# Patient Record
Sex: Male | Born: 1935 | Race: Black or African American | Hispanic: No | Marital: Married | Smoking: Never smoker
Health system: Southern US, Community
[De-identification: ages and names within clinical notes are randomized; demographics above are authoritative.]

## PROBLEM LIST (undated history)

## (undated) DIAGNOSIS — L719 Rosacea, unspecified: Secondary | ICD-10-CM

## (undated) DIAGNOSIS — I6529 Occlusion and stenosis of unspecified carotid artery: Secondary | ICD-10-CM

## (undated) DIAGNOSIS — E785 Hyperlipidemia, unspecified: Secondary | ICD-10-CM

## (undated) DIAGNOSIS — I10 Essential (primary) hypertension: Secondary | ICD-10-CM

## (undated) DIAGNOSIS — Z8639 Personal history of other endocrine, nutritional and metabolic disease: Secondary | ICD-10-CM

## (undated) DIAGNOSIS — T50995A Adverse effect of other drugs, medicaments and biological substances, initial encounter: Secondary | ICD-10-CM

## (undated) DIAGNOSIS — E039 Hypothyroidism, unspecified: Secondary | ICD-10-CM

## (undated) DIAGNOSIS — E119 Type 2 diabetes mellitus without complications: Secondary | ICD-10-CM

## (undated) DIAGNOSIS — D649 Anemia, unspecified: Secondary | ICD-10-CM

## (undated) DIAGNOSIS — Z862 Personal history of diseases of the blood and blood-forming organs and certain disorders involving the immune mechanism: Secondary | ICD-10-CM

## (undated) DIAGNOSIS — M129 Arthropathy, unspecified: Secondary | ICD-10-CM

## (undated) DIAGNOSIS — R0989 Other specified symptoms and signs involving the circulatory and respiratory systems: Secondary | ICD-10-CM

## (undated) DIAGNOSIS — E559 Vitamin D deficiency, unspecified: Secondary | ICD-10-CM

## (undated) DIAGNOSIS — R7309 Other abnormal glucose: Secondary | ICD-10-CM

## (undated) DIAGNOSIS — F528 Other sexual dysfunction not due to a substance or known physiological condition: Secondary | ICD-10-CM

## (undated) HISTORY — DX: Hyperlipidemia, unspecified: E78.5

## (undated) HISTORY — DX: Hypothyroidism, unspecified: E03.9

## (undated) HISTORY — DX: Occlusion and stenosis of unspecified carotid artery: I65.29

## (undated) HISTORY — PX: TONSILLECTOMY: SUR1361

## (undated) HISTORY — DX: Adverse effect of other drugs, medicaments and biological substances, initial encounter: T50.995A

## (undated) HISTORY — DX: Essential (primary) hypertension: I10

## (undated) HISTORY — DX: Anemia, unspecified: D64.9

## (undated) HISTORY — DX: Personal history of diseases of the blood and blood-forming organs and certain disorders involving the immune mechanism: Z86.2

## (undated) HISTORY — DX: Arthropathy, unspecified: M12.9

## (undated) HISTORY — DX: Personal history of other endocrine, nutritional and metabolic disease: Z86.39

## (undated) HISTORY — DX: Rosacea, unspecified: L71.9

## (undated) HISTORY — DX: Other abnormal glucose: R73.09

## (undated) HISTORY — DX: Vitamin D deficiency, unspecified: E55.9

## (undated) HISTORY — DX: Other sexual dysfunction not due to a substance or known physiological condition: F52.8

## (undated) HISTORY — DX: Other specified symptoms and signs involving the circulatory and respiratory systems: R09.89

## (undated) HISTORY — PX: HERNIA REPAIR: SHX51

---
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
## 2004-08-21 ENCOUNTER — Ambulatory Visit: Payer: Self-pay | Admitting: Family Medicine

## 2004-12-03 ENCOUNTER — Ambulatory Visit: Payer: Self-pay | Admitting: Family Medicine

## 2004-12-10 ENCOUNTER — Ambulatory Visit: Payer: Self-pay | Admitting: Family Medicine

## 2005-03-10 ENCOUNTER — Ambulatory Visit: Payer: Self-pay | Admitting: Family Medicine

## 2005-12-04 ENCOUNTER — Ambulatory Visit: Payer: Self-pay | Admitting: Family Medicine

## 2005-12-04 LAB — CONVERTED CEMR LAB
ALT: 64 units/L — ABNORMAL HIGH (ref 0–40)
Albumin: 4.2 g/dL (ref 3.5–5.2)
Alkaline Phosphatase: 51 units/L (ref 39–117)
BUN: 16 mg/dL (ref 6–23)
Basophils Absolute: 0 10*3/uL (ref 0.0–0.1)
Basophils Relative: 0.3 % (ref 0.0–1.0)
CO2: 31 meq/L (ref 19–32)
Chol/HDL Ratio, serum: 3.6
Glomerular Filtration Rate, Af Am: 78 mL/min/{1.73_m2}
Hemoglobin: 14 g/dL (ref 13.0–17.0)
Hgb A1c MFr Bld: 6.7 % — ABNORMAL HIGH (ref 4.6–6.0)
Monocytes Relative: 9 % (ref 3.0–11.0)
Neutrophils Relative %: 46.7 % (ref 43.0–77.0)
PSA: 1.09 ng/mL (ref 0.10–4.00)
Platelets: 251 10*3/uL (ref 150–400)
Potassium: 4.1 meq/L (ref 3.5–5.1)
RDW: 12.8 % (ref 11.5–14.6)
Total Bilirubin: 0.8 mg/dL (ref 0.3–1.2)
Total Protein: 7.2 g/dL (ref 6.0–8.3)
WBC: 6.9 10*3/uL (ref 4.5–10.5)

## 2005-12-31 ENCOUNTER — Ambulatory Visit: Payer: Self-pay | Admitting: Family Medicine

## 2006-04-08 ENCOUNTER — Ambulatory Visit: Payer: Self-pay | Admitting: Family Medicine

## 2006-04-15 ENCOUNTER — Ambulatory Visit: Payer: Self-pay | Admitting: Family Medicine

## 2006-05-27 ENCOUNTER — Ambulatory Visit: Payer: Self-pay | Admitting: Family Medicine

## 2006-05-27 LAB — CONVERTED CEMR LAB
Albumin: 4 g/dL (ref 3.5–5.2)
Alkaline Phosphatase: 53 units/L (ref 39–117)
BUN: 14 mg/dL (ref 6–23)
GFR calc Af Amer: 77 mL/min
GFR calc non Af Amer: 64 mL/min
Potassium: 3.8 meq/L (ref 3.5–5.1)
Total CHOL/HDL Ratio: 4.4
Triglycerides: 126 mg/dL (ref 0–149)
VLDL: 25 mg/dL (ref 0–40)

## 2006-06-03 ENCOUNTER — Ambulatory Visit: Payer: Self-pay | Admitting: Family Medicine

## 2006-12-31 ENCOUNTER — Ambulatory Visit: Payer: Self-pay | Admitting: Family Medicine

## 2007-01-06 ENCOUNTER — Ambulatory Visit: Payer: Self-pay | Admitting: Family Medicine

## 2007-01-06 DIAGNOSIS — M129 Arthropathy, unspecified: Secondary | ICD-10-CM | POA: Insufficient documentation

## 2007-01-06 DIAGNOSIS — E785 Hyperlipidemia, unspecified: Secondary | ICD-10-CM

## 2007-01-06 DIAGNOSIS — I10 Essential (primary) hypertension: Secondary | ICD-10-CM

## 2007-01-06 DIAGNOSIS — L719 Rosacea, unspecified: Secondary | ICD-10-CM

## 2007-01-06 DIAGNOSIS — F528 Other sexual dysfunction not due to a substance or known physiological condition: Secondary | ICD-10-CM

## 2007-01-06 HISTORY — DX: Arthropathy, unspecified: M12.9

## 2007-01-06 HISTORY — DX: Other sexual dysfunction not due to a substance or known physiological condition: F52.8

## 2007-01-06 HISTORY — DX: Essential (primary) hypertension: I10

## 2007-01-06 HISTORY — DX: Hyperlipidemia, unspecified: E78.5

## 2007-01-06 HISTORY — DX: Rosacea, unspecified: L71.9

## 2007-01-06 LAB — CONVERTED CEMR LAB
ALT: 40 units/L (ref 0–53)
Albumin: 4 g/dL (ref 3.5–5.2)
Alkaline Phosphatase: 61 units/L (ref 39–117)
BUN: 14 mg/dL (ref 6–23)
Basophils Relative: 0.6 % (ref 0.0–1.0)
CO2: 32 meq/L (ref 19–32)
Calcium: 9.3 mg/dL (ref 8.4–10.5)
GFR calc Af Amer: 96 mL/min
GFR calc non Af Amer: 79 mL/min
HDL: 34.7 mg/dL — ABNORMAL LOW (ref >39.0)
LDL Cholesterol: 78 mg/dL (ref 0–99)
Lymphocytes Relative: 40.8 % (ref 12.0–46.0)
Monocytes Relative: 8.2 % (ref 3.0–11.0)
Neutro Abs: 3 10*3/uL (ref 1.4–7.7)
Platelets: 252 10*3/uL (ref 150–400)
Total Protein: 7 g/dL (ref 6.0–8.3)
Triglycerides: 92 mg/dL (ref 0–149)
VLDL: 18 mg/dL (ref 0–40)

## 2007-04-21 ENCOUNTER — Telehealth: Payer: Self-pay | Admitting: Family Medicine

## 2008-01-10 ENCOUNTER — Ambulatory Visit: Payer: Self-pay | Admitting: Family Medicine

## 2008-01-10 DIAGNOSIS — E039 Hypothyroidism, unspecified: Secondary | ICD-10-CM

## 2008-01-10 DIAGNOSIS — T50995A Adverse effect of other drugs, medicaments and biological substances, initial encounter: Secondary | ICD-10-CM

## 2008-01-10 DIAGNOSIS — E559 Vitamin D deficiency, unspecified: Secondary | ICD-10-CM | POA: Insufficient documentation

## 2008-01-10 DIAGNOSIS — D649 Anemia, unspecified: Secondary | ICD-10-CM

## 2008-01-10 HISTORY — DX: Adverse effect of other drugs, medicaments and biological substances, initial encounter: T50.995A

## 2008-01-10 HISTORY — DX: Anemia, unspecified: D64.9

## 2008-01-10 HISTORY — DX: Vitamin D deficiency, unspecified: E55.9

## 2008-01-10 HISTORY — DX: Hypothyroidism, unspecified: E03.9

## 2008-01-11 ENCOUNTER — Encounter: Payer: Self-pay | Admitting: Family Medicine

## 2008-01-13 LAB — CONVERTED CEMR LAB: Vit D, 1,25-Dihydroxy: 21 — ABNORMAL LOW (ref 30–89)

## 2008-01-17 ENCOUNTER — Telehealth: Payer: Self-pay | Admitting: Family Medicine

## 2008-04-12 ENCOUNTER — Ambulatory Visit: Payer: Self-pay | Admitting: Family Medicine

## 2008-04-16 LAB — CONVERTED CEMR LAB
Cholesterol: 156 mg/dL (ref 0–200)
LDL Cholesterol: 98 mg/dL (ref 0–99)
Triglycerides: 121 mg/dL (ref 0–149)

## 2008-10-09 ENCOUNTER — Telehealth: Payer: Self-pay | Admitting: Family Medicine

## 2008-11-22 ENCOUNTER — Ambulatory Visit: Payer: Self-pay | Admitting: Family Medicine

## 2009-01-10 ENCOUNTER — Ambulatory Visit: Payer: Self-pay | Admitting: Family Medicine

## 2009-01-10 ENCOUNTER — Ambulatory Visit: Payer: Self-pay

## 2009-01-10 DIAGNOSIS — I6529 Occlusion and stenosis of unspecified carotid artery: Secondary | ICD-10-CM

## 2009-01-10 DIAGNOSIS — R0989 Other specified symptoms and signs involving the circulatory and respiratory systems: Secondary | ICD-10-CM | POA: Insufficient documentation

## 2009-01-10 HISTORY — DX: Occlusion and stenosis of unspecified carotid artery: I65.29

## 2009-01-10 HISTORY — DX: Other specified symptoms and signs involving the circulatory and respiratory systems: R09.89

## 2009-05-21 ENCOUNTER — Telehealth: Payer: Self-pay | Admitting: Family Medicine

## 2009-11-14 ENCOUNTER — Ambulatory Visit: Payer: Self-pay | Admitting: Family Medicine

## 2009-11-25 ENCOUNTER — Telehealth: Payer: Self-pay | Admitting: Family Medicine

## 2009-12-02 ENCOUNTER — Ambulatory Visit: Payer: Self-pay | Admitting: Family Medicine

## 2009-12-02 DIAGNOSIS — R7309 Other abnormal glucose: Secondary | ICD-10-CM

## 2009-12-02 DIAGNOSIS — Z862 Personal history of diseases of the blood and blood-forming organs and certain disorders involving the immune mechanism: Secondary | ICD-10-CM

## 2009-12-02 DIAGNOSIS — Z8639 Personal history of other endocrine, nutritional and metabolic disease: Secondary | ICD-10-CM

## 2009-12-02 HISTORY — DX: Other abnormal glucose: R73.09

## 2009-12-02 HISTORY — DX: Personal history of diseases of the blood and blood-forming organs and certain disorders involving the immune mechanism: Z86.39

## 2009-12-02 HISTORY — DX: Personal history of diseases of the blood and blood-forming organs and certain disorders involving the immune mechanism: Z86.2

## 2009-12-02 LAB — CONVERTED CEMR LAB
ALT: 29 units/L (ref 0–53)
AST: 29 units/L (ref 0–37)
Albumin: 3.9 g/dL (ref 3.5–5.2)
Alkaline Phosphatase: 53 units/L (ref 39–117)
Basophils Relative: 0.6 % (ref 0.0–3.0)
CO2: 28 meq/L (ref 19–32)
Chloride: 101 meq/L (ref 96–112)
Eosinophils Relative: 1.5 % (ref 0.0–5.0)
GFR calc non Af Amer: 86.45 mL/min (ref >60)
HCT: 39 % (ref 39.0–52.0)
Hemoglobin: 13.5 g/dL (ref 13.0–17.0)
Lymphocytes Relative: 41.9 % (ref 12.0–46.0)
Lymphs Abs: 3.1 10*3/uL (ref 0.7–4.0)
Monocytes Relative: 9.1 % (ref 3.0–12.0)
Neutro Abs: 3.5 10*3/uL (ref 1.4–7.7)
Phosphorus: 2.7 mg/dL (ref 2.3–4.6)
Potassium: 4 meq/L (ref 3.5–5.1)
RBC: 4.21 M/uL — ABNORMAL LOW (ref 4.22–5.81)
Total Protein: 6.8 g/dL (ref 6.0–8.3)

## 2010-01-10 ENCOUNTER — Telehealth (INDEPENDENT_AMBULATORY_CARE_PROVIDER_SITE_OTHER): Payer: Self-pay | Admitting: *Deleted

## 2010-01-23 ENCOUNTER — Ambulatory Visit: Payer: Self-pay | Admitting: Family Medicine

## 2010-01-23 LAB — CONVERTED CEMR LAB
Glucose, Urine, Semiquant: NEGATIVE
Protein, U semiquant: NEGATIVE
Specific Gravity, Urine: 1.02
WBC Urine, dipstick: NEGATIVE
pH: 5.5

## 2010-02-10 LAB — CONVERTED CEMR LAB
ALT: 37 units/L (ref 0–53)
AST: 32 units/L (ref 0–37)
Albumin: 4.3 g/dL (ref 3.5–5.2)
Basophils Absolute: 0.1 10*3/uL (ref 0.0–0.1)
Chloride: 99 meq/L (ref 96–112)
Eosinophils Relative: 2.2 % (ref 0.0–5.0)
GFR calc non Af Amer: 80.38 mL/min (ref >60)
Glucose, Bld: 123 mg/dL — ABNORMAL HIGH (ref 70–99)
HCT: 40.3 % (ref 39.0–52.0)
Hemoglobin: 13.7 g/dL (ref 13.0–17.0)
Lymphs Abs: 3.3 10*3/uL (ref 0.7–4.0)
Monocytes Relative: 7.1 % (ref 3.0–12.0)
Neutro Abs: 4.3 10*3/uL (ref 1.4–7.7)
Potassium: 4 meq/L (ref 3.5–5.1)
RDW: 13.3 % (ref 11.5–14.6)
Sodium: 136 meq/L (ref 135–145)
TSH: 0.84 microintl units/mL (ref 0.35–5.50)
VLDL: 22.6 mg/dL (ref 0.0–40.0)

## 2010-02-13 ENCOUNTER — Encounter: Payer: Self-pay | Admitting: Family Medicine

## 2010-02-13 ENCOUNTER — Ambulatory Visit: Payer: Self-pay | Admitting: Family Medicine

## 2010-03-23 LAB — CONVERTED CEMR LAB
ALT: 59 units/L — ABNORMAL HIGH (ref 0–53)
AST: 50 units/L — ABNORMAL HIGH (ref 0–37)
Albumin: 4.2 g/dL (ref 3.5–5.2)
Alkaline Phosphatase: 58 units/L (ref 39–117)
BUN: 14 mg/dL (ref 6–23)
Basophils Absolute: 0 10*3/uL (ref 0.0–0.1)
Basophils Relative: 0.5 % (ref 0.0–3.0)
Bilirubin Urine: NEGATIVE
Bilirubin Urine: NEGATIVE
Bilirubin, Direct: 0 mg/dL (ref 0.0–0.3)
Blood in Urine, dipstick: NEGATIVE
Blood in Urine, dipstick: NEGATIVE
Calcium: 9.6 mg/dL (ref 8.4–10.5)
Chloride: 102 meq/L (ref 96–112)
Chloride: 105 meq/L (ref 96–112)
Cholesterol: 131 mg/dL (ref 0–200)
Cholesterol: 151 mg/dL (ref 0–200)
Creatinine, Ser: 1 mg/dL (ref 0.4–1.5)
Creatinine, Ser: 1.1 mg/dL (ref 0.4–1.5)
Eosinophils Absolute: 0.1 10*3/uL (ref 0.0–0.7)
Eosinophils Relative: 1.6 % (ref 0.0–5.0)
GFR calc Af Amer: 95 mL/min
GFR calc non Af Amer: 79 mL/min
GFR calc non Af Amer: 84.86 mL/min (ref >60)
Glucose, Urine, Semiquant: NEGATIVE
HCT: 40.9 % (ref 39.0–52.0)
HDL: 36 mg/dL — ABNORMAL LOW (ref >39.00)
HDL: 38.3 mg/dL — ABNORMAL LOW (ref >39.0)
Ketones, urine, test strip: NEGATIVE
Ketones, urine, test strip: NEGATIVE
LDL Cholesterol: 73 mg/dL (ref 0–99)
Lymphocytes Relative: 40.9 % (ref 12.0–46.0)
MCHC: 33.8 g/dL (ref 30.0–36.0)
MCV: 92.7 fL (ref 78.0–100.0)
MCV: 95.6 fL (ref 78.0–100.0)
Monocytes Absolute: 0.6 10*3/uL (ref 0.1–1.0)
Monocytes Absolute: 0.6 10*3/uL (ref 0.1–1.0)
Neutrophils Relative %: 48.8 % (ref 43.0–77.0)
Neutrophils Relative %: 57.4 % (ref 43.0–77.0)
Nitrite: NEGATIVE
PSA: 1.01 ng/mL (ref 0.10–4.00)
PSA: 1.11 ng/mL (ref 0.10–4.00)
Platelets: 221 10*3/uL (ref 150–400)
Platelets: 225 10*3/uL (ref 150.0–400.0)
Potassium: 3.9 meq/L (ref 3.5–5.1)
Protein, U semiquant: NEGATIVE
Protein, U semiquant: NEGATIVE
RBC: 4.35 M/uL (ref 4.22–5.81)
Total Bilirubin: 0.9 mg/dL (ref 0.3–1.2)
Total Bilirubin: 1 mg/dL (ref 0.3–1.2)
Total CHOL/HDL Ratio: 4
Total Protein: 7.7 g/dL (ref 6.0–8.3)
Triglycerides: 110 mg/dL (ref 0.0–149.0)
Triglycerides: 112 mg/dL (ref 0–149)
Urobilinogen, UA: 0.2
VLDL: 22 mg/dL (ref 0.0–40.0)
VLDL: 22 mg/dL (ref 0–40)
Vit D, 25-Hydroxy: 42 ng/mL (ref 30–89)
WBC: 7.5 10*3/uL (ref 4.5–10.5)
pH: 7

## 2010-03-27 NOTE — Assessment & Plan Note (Signed)
Summary: FLU SHOT // RS   Nurse Visit   Allergies: No Known Drug Allergies  Orders Added: 1)  Admin 1st Vaccine [90471] 2)  Flu Vaccine 3yrs + [90658] Flu Vaccine Consent Questions     Do you have a history of severe allergic reactions to this vaccine? no    Any prior history of allergic reactions to egg and/or gelatin? no    Do you have a sensitivity to the preservative Thimersol? no    Do you have a past history of Guillan-Barre Syndrome? no    Do you currently have an acute febrile illness? no    Have you ever had a severe reaction to latex? no    Vaccine information given and explained to patient? yes    Are you currently pregnant? no    Lot Number:[id number]   Exp Date:08/23/2010   Site Given  Left Deltoid IM .lbflu 

## 2010-03-27 NOTE — Progress Notes (Signed)
Summary: refill  Phone Note Call from Patient Call back at Home Phone [phone] Call back at [phone]   Caller: Patient--live call Summary of Call: refill doxycycline mono 100mg . [facility]     [place]---[phone] Initial call taken by: Warnell Forester,  November 25, 2009 8:59 AM  Follow-up for Phone Call        Need to know for what and when he last took it. I only see Erythromycin and Cephalexin in his med list. No Doxy seen  Additional Follow-up for Phone Call Additional follow up Details #1::        Patient states it was for Urinary infection. It was prescribed in 2009. Additional Follow-up by: Josph Macho RMA,  November 25, 2009 10:11 AM    Additional Follow-up for Phone Call Additional follow up Details #2::    He can have Doxycycline 100mg  by mouth two times a day x 7 days but if symptoms are not improving he should come in for evaluaiton  Follow-up by: Danise Edge MD,  November 25, 2009 4:28 PM  New/Updated Medications: DOXYCYCLINE HYCLATE 100 MG TABS (DOXYCYCLINE HYCLATE) by mouth two times a day X7 days Prescriptions: DOXYCYCLINE HYCLATE 100 MG TABS (DOXYCYCLINE HYCLATE) by mouth two times a day X7 days  #14 x 0   Entered by:   Josph Macho RMA   Authorized by:   Danise Edge MD   Signed by:   Josph Macho RMA on 11/25/2009   Method used:   [facility]  [address] [phone][facility] ([facility]ail[facility]       [address]       [place], [place]  [address]       Ph: [phone] or [phone]       Fax: [phone]   RxID:   [id number]   Appended Document: refill Left a vm on pts cell stating meds were being sent in and if they don't improve to please come in for an office visit.

## 2010-03-27 NOTE — Assessment & Plan Note (Signed)
Summary: fu on med/njr   Vital Signs:  Patient profile:   75 year old male Height:      68.5 inches (173.99 cm) Weight:      188 pounds (85.45 kg) BMI:     28.27 O2 Sat:      99 % on Room air Temp:     97.9 degrees F (36.61 degrees C) oral Pulse rate:   75 / minute BP sitting:   134 / 92  (left arm) Cuff size:   regular  Vitals Entered By: Josph Macho RMA (December 02, 2009 9:40 AM)  O2 Flow:  Room air CC: Follow-up visit/ CF Is Patient Diabetic? No   History of Present Illness: Patient in today for evaluation of dysuria. He had called a couple weeks ago with c/o dysuria and requested a course of Doxycycline for some urinary symptoms. He felt that the dysuria was better after each dose only to worsen a few hours later. No urinary frequency/urgency/abd or back pain. No f/c/n/v/malaise or myalgias.   Current Medications (verified): 1)  Ibuprofen 800 Mg Tabs (Ibuprofen) .... Three Times A Day With Food 2)  Lipitor 40 Mg  Tabs (Atorvastatin Calcium) .... Once Daily 3)  Quinapril-Hydrochlorothiazide 20-12.5 Mg  Tabs (Quinapril-Hydrochlorothiazide) .... Two Times A Day 4)  Hydrocodone-Homatropine 5-1.5 Mg/94ml Syrp (Hydrocodone-Homatropine) .Marland Kitchen.. 1-2 Tsp Q4h As Needed Cough 5)  Vitamin D 2000 Unit Tabs (Cholecalciferol) .Marland Kitchen.. 1 Once Daily 6)  Aspir-Low 81 Mg Tbec (Aspirin) .Marland Kitchen.. 1 Qd 7)  Potassium Otc .Marland Kitchen.. 1 Qd 8)  Viagra 100 Mg Tabs (Sildenafil Citrate) .Marland Kitchen.. 1 As Instructed 9)  Erythromycin Topical Gel 2% .... Apply Once or Twice A Day To Affected Area  Allergies (verified): No Known Drug Allergies  Past History:  Past medical history reviewed for relevance to current acute and chronic problems. Social history (including risk factors) reviewed for relevance to current acute and chronic problems.  Social History: Reviewed history and no changes required.  Review of Systems      See HPI  Physical Exam  General:  Well-developed,well-nourished,in no acute distress;  alert,appropriate and cooperative throughout examination Mouth:  Oral mucosa and oropharynx without lesions or exudates.  Teeth in good repair. Neck:  No deformities, masses, or tenderness noted. Lungs:  Normal respiratory effort, chest expands symmetrically. Lungs are clear to auscultation, no crackles or wheezes. Heart:  Normal rate and regular rhythm. S1 and S2 normal without gallop, click, rub or other extra sounds.grade  1/6 systolic murmur.   Abdomen:  Bowel sounds positive,abdomen soft and non-tender without masses, organomegaly or hernias noted. Extremities:  No clubbing, cyanosis, edema, or deformity noted with normal full range of motion of all joints.   Psych:  Cognition and judgment appear intact. Alert and cooperative with normal attention span and concentration. No apparent delusions, illusions, hallucinations   Impression & Recommendations:  Problem # 1:  DYSURIA (ICD-788.1)  The following medications were removed from the medication list:    Cephalexin 500 Mg Caps (Cephalexin) .Marland Kitchen... 1 by mouth three times a day    Doxycycline Hyclate 100 Mg Tabs (Doxycycline hyclate) ..... By mouth two times a day x7 days His updated medication list for this problem includes:    Cipro 250 Mg Tabs (Ciprofloxacin hcl) .Marland Kitchen... 1 tab by mouth two times a day x 10 days  Problem # 2:  LIVER FUNCTION TESTS, ABNORMAL, HX OF (ICD-V12.2)  Orders: TLB-Hepatic/Liver Function Pnl (80076-HEPATIC) Repeat lfts to evaluate  Problem # 3:  HYPERGLYCEMIA, BORDERLINE (ICD-790.29) repeat  Renal today. Avoid simple carbs  Complete Medication List: 1)  Ibuprofen 800 Mg Tabs (Ibuprofen) .... Three times a day with food 2)  Lipitor 40 Mg Tabs (Atorvastatin calcium) .... Once daily 3)  Quinapril-hydrochlorothiazide 20-12.5 Mg Tabs (Quinapril-hydrochlorothiazide) .... Two times a day 4)  Hydrocodone-homatropine 5-1.5 Mg/62ml Syrp (Hydrocodone-homatropine) .Marland Kitchen.. 1-2 tsp q4h as needed cough 5)  Vitamin D 2000 Unit  Tabs (Cholecalciferol) .Marland Kitchen.. 1 once daily 6)  Aspir-low 81 Mg Tbec (Aspirin) .Marland Kitchen.. 1 qd 7)  Potassium Otc  .Marland Kitchen.. 1 qd 8)  Viagra 100 Mg Tabs (Sildenafil citrate) .Marland Kitchen.. 1 as instructed 9)  Erythromycin Topical Gel 2%  .... Apply once or twice a day to affected area 10)  Cipro 250 Mg Tabs (Ciprofloxacin hcl) .Marland Kitchen.. 1 tab by mouth two times a day x 10 days  Other Orders: TLB-Renal Function Panel (80069-RENAL) TLB-CBC Platelet - w/Differential (85025-CBCD) UA Dipstick w/o Micro (automated)  (81003)  Patient Instructions: 1)  Drink plenty of fluids up to 3-4 quarts a day. Cranberry juice is especially recommended in addition to large amounts of water. Avoid caffeine & carbonated drinks, they tend to irritate the bladder, Return in 3-5 days if you're not better: sooner if you're feeling worse.  2)  Take your antibiotic as prescribed until ALL of it is gone, but stop if you develop a rash or swelling and contact our office as soon as possible.  3)  Please schedule a follow-up appointment as needed if symptoms worsenor do ot improve 4)  Consider a probiotic such as a yogurt daily or Align 1 cap daily for the month whenever you are placed on an antibiotics Prescriptions: CIPRO 250 MG TABS (CIPROFLOXACIN HCL) 1 tab by mouth two times a day x 10 days  #20 x 0   Entered and Authorized by:   Danise Edge MD   Signed by:   Danise Edge MD on 12/02/2009   Method used:   [facility]        [facility]  [address] [phone][facility] (retail)       [address]       [place], [place]  [address]       Ph: [phone] or [phone]       Fax: [phone]   RxID:   [id number]   Appended Document: Orders Update    Clinical Lists Changes  Orders: Added new Service order of Venipuncture (09323) - Signed Added new Service order of Specimen Handling (55732) - Signed Observations: Added new observation of COMMENTS: Wynona Canes, CMA  December 02, 2009 11:32 AM  (12/02/2009 10:33) Added  new observation of PH URINE: 5.5  (12/02/2009 10:33) Added new observation of SPEC GR URIN: 1.010  (12/02/2009 10:33) Added new observation of APPEARANCE U: Clear  (12/02/2009 10:33) Added new observation of UA COLOR: yellow  (12/02/2009 10:33) Added new observation of WBC DIPSTK U: negative  (12/02/2009 10:33) Added new observation of NITRITE URN: negative  (12/02/2009 10:33) Added new observation of UROBILINOGEN: 0.2  (12/02/2009 10:33) Added new observation of PROTEIN, URN: negative  (12/02/2009 10:33) Added new observation of BLOOD UR DIP: negative  (12/02/2009 10:33) Added new observation of KETONES URN: negative  (12/02/2009 10:33) Added new observation of BILIRUBIN UR: negative  (12/02/2009 10:33) Added new observation of GLUCOSE, URN: negative  (12/02/2009 10:33)      Laboratory Results   Urine Tests  Date/Time Recieved: December 02, 2009 11:32 AM  Date/Time Reported: December 02, 2009 11:32  AM   Routine Urinalysis   Color: yellow Appearance: Clear Glucose: negative   (Normal Range: Negative) Bilirubin: negative   (Normal Range: Negative) Ketone: negative   (Normal Range: Negative) Spec. Gravity: 1.010   (Normal Range: 1.003-1.035) Blood: negative   (Normal Range: Negative) pH: 5.5   (Normal Range: 5.0-8.0) Protein: negative   (Normal Range: Negative) Urobilinogen: 0.2   (Normal Range: 0-1) Nitrite: negative   (Normal Range: Negative) Leukocyte Esterace: negative   (Normal Range: Negative)    Comments: Wynona Canes, CMA  December 02, 2009 11:32 AM    notify ua neg, would only take 3 days of the antibiotic I gave him, have him increase his hydration, dehydration can lead to some burning   Pt informed/ CF

## 2010-03-27 NOTE — Assessment & Plan Note (Signed)
Summary: cpx//ccm----PT RSC (BMP) // RS----PT [facility] // RS   Vital Signs:  Patient profile:   75 year old male Height:      67.25 inches Weight:      189 pounds Temp:     98.6 degrees F oral Pulse rate:   84 / minute Pulse rhythm:   regular BP sitting:   116 / 88  (left arm) Cuff size:   large  Vitals Entered By: Alfred Levins, CMA (February 13, 2010 2:10 PM) CC: cpx   History of Present Illness: This 75 year old African American male who is very pleasant and cooperative patient for many years He is in to discuss his medical problem as well as renew his medication also were concerned somewhat about ahyperglycemia LH he's had some pain in the lumbosacral back when he doesn't take Profen and instructed to restart  Ibuprofen 800 mg t.i.d. for back pain as well as elbow pain Blood pressure well controlled at 116/88 Hyperlipidemia controlled with Lipitor 40 mg each today Continues to have erectile dysfunction and normally with Viagra Rosacea controlled with erythromycin topical gel An insurance exam blood sugar was 127 and hemoglobin A 1 C had to be repeated today    Current Medications (verified): 1)  Ibuprofen 800 Mg Tabs (Ibuprofen) .... Three Times A Day With Food 2)  Lipitor 40 Mg  Tabs (Atorvastatin Calcium) .... Once Daily 3)  Quinapril-Hydrochlorothiazide 20-12.5 Mg  Tabs (Quinapril-Hydrochlorothiazide) .... Two Times A Day 4)  Vitamin D 2000 Unit Tabs (Cholecalciferol) .Marland Kitchen.. 1 Once Daily 5)  Aspir-Low 81 Mg Tbec (Aspirin) .Marland Kitchen.. 1 Qd 6)  Potassium Otc .Marland Kitchen.. 1 Qd 7)  Viagra 100 Mg Tabs (Sildenafil Citrate) .Marland Kitchen.. 1 As Instructed 8)  Erythromycin Topical Gel 2% .... Apply Once or Twice A Day To Affected Area  Allergies (verified): No Known Drug Allergies  Review of Systems      See HPI General:  Denies chills, fatigue, fever, loss of appetite, malaise, sleep disorder, sweats, weakness, and weight loss. Eyes:  Denies blurring, discharge, double vision, eye irritation, eye pain,  halos, itching, light sensitivity, red eye, vision loss-1 eye, and vision loss-both eyes. ENT:  Denies decreased hearing, difficulty swallowing, ear discharge, earache, hoarseness, nasal congestion, nosebleeds, postnasal drainage, ringing in ears, sinus pressure, and sore throat. CV:  Denies bluish discoloration of lips or nails, chest pain or discomfort, difficulty breathing at night, difficulty breathing while lying down, fainting, fatigue, leg cramps with exertion, lightheadness, near fainting, palpitations, shortness of breath with exertion, swelling of feet, swelling of hands, and weight gain. Resp:  Denies chest discomfort, chest pain with inspiration, cough, coughing up blood, excessive snoring, hypersomnolence, morning headaches, pleuritic, shortness of breath, sputum productive, and wheezing. GI:  Denies abdominal pain, bloody stools, change in bowel habits, constipation, dark tarry stools, diarrhea, excessive appetite, gas, hemorrhoids, indigestion, loss of appetite, nausea, vomiting, vomiting blood, and yellowish skin color. GU:  Complains of erectile dysfunction.  Physical Exam  General:  Well-developed,well-nourished,in no acute distress; alert,appropriate and cooperative throughout examinationoverweight-appearing.   Head:  Normocephalic and atraumatic without obvious abnormalities. No apparent alopecia or balding. Eyes:  No corneal or conjunctival inflammation noted. EOMI. Perrla. Funduscopic exam benign, without hemorrhages, exudates or papilledema. Vision grossly normal. Ears:  External ear exam shows no significant lesions or deformities.  Otoscopic examination reveals clear canals, tympanic membranes are intact bilaterally without bulging, retraction, inflammation or discharge. Hearing is grossly normal bilaterally. Nose:  External nasal examination shows no deformity or inflammation. Nasal mucosa are pink  and moist without lesions or exudates. Mouth:  Oral mucosa and oropharynx  without lesions or exudates.  Teeth in good repair. Neck:  carotid pulse on the left is much stronger than the right however no bruit present Chest Wall:  No deformities, masses, tenderness or gynecomastia noted. Breasts:  No masses or gynecomastia noted Lungs:  Normal respiratory effort, chest expands symmetrically. Lungs are clear to auscultation, no crackles or wheezes. Heart:  Normal rate and regular rhythm. S1 and S2 normal without gallop, murmur, click, rub or other extra sounds. Abdomen:  Bowel sounds positive,abdomen soft and non-tender without masses, organomegaly or hernias noted. Rectal:  No external abnormalities noted. Normal sphincter tone. No rectal masses or tenderness. Genitalia:  Testes bilaterally descended without nodularity, tenderness or masses. No scrotal masses or lesions. No penis lesions or urethral discharge. Prostate:  1+ enlarged.   Msk:  tenderness over both SI joints bowel laterally as well as tenderness of the lateral epicondyles the right elbow Pulses:  R and L carotid,radial,femoral,dorsalis pedis and posterior tibial pulses are full and equal bilaterally Extremities:  No clubbing, cyanosis, edema, or deformity noted with normal full range of motion of all joints.   Neurologic:  No cranial nerve deficits noted. Station and gait are normal. Plantar reflexes are down-going bilaterally. DTRs are symmetrical throughout. Sensory, motor and coordinative functions appear intact. Skin:  minimal signs of rosacea over the face Cervical Nodes:  No lymphadenopathy noted Axillary Nodes:  No palpable lymphadenopathy Inguinal Nodes:  No significant adenopathy Psych:  Cognition and judgment appear intact. Alert and cooperative with normal attention span and concentration. No apparent delusions, illusions, hallucinations   Impression & Recommendations:  Problem # 1:  HYPERGLYCEMIA, BORDERLINE (ICD-790.29) Assessment Deteriorated  Orders: Specimen Handling (04540) TLB-A1C  / Hgb A1C (Glycohemoglobin) (83036-A1C)  Problem # 2:  LIVER FUNCTION TESTS, ABNORMAL, HX OF (ICD-V12.2) Assessment: Improved  Problem # 3:  ERECTILE DYSFUNCTION (ICD-302.72) Assessment: Unchanged  His updated medication list for this problem includes:    Viagra 100 Mg Tabs (Sildenafil citrate) .Marland Kitchen... 1 as instructed  Problem # 4:  ARTHRITIS (ICD-716.90) Assessment: Deteriorated  Problem # 5:  ROSACEA (ICD-695.3) Assessment: Improved erythromycin topical gel 2% once or twice today  Problem # 6:  HYPERLIPIDEMIA (ICD-272.4) Assessment: Improved  His updated medication list for this problem includes:    Lipitor 40 Mg Tabs (Atorvastatin calcium) ..... Once daily  Problem # 7:  HYPERTENSION, BENIGN ESSENTIAL (ICD-401.1) Assessment: Improved  His updated medication list for this problem includes:    Quinapril-hydrochlorothiazide 20-12.5 Mg Tabs (Quinapril-hydrochlorothiazide) .Marland Kitchen..Marland Kitchen Two times a day  Orders: Prescription Created Electronically [id number])  Complete Medication List: 1)  Ibuprofen 800 Mg Tabs (Ibuprofen) .... Three times a day with food 2)  Lipitor 40 Mg Tabs (Atorvastatin calcium) .... Once daily 3)  Quinapril-hydrochlorothiazide 20-12.5 Mg Tabs (Quinapril-hydrochlorothiazide) .... Two times a day 4)  Vitamin D 2000 Unit Tabs (Cholecalciferol) .Marland Kitchen.. 1 once daily 5)  Aspir-low 81 Mg Tbec (Aspirin) .Marland Kitchen.. 1 qd 6)  Potassium Otc  .Marland Kitchen.. 1 qd 7)  Viagra 100 Mg Tabs (Sildenafil citrate) .Marland Kitchen.. 1 as instructed 8)  Erythromycin Topical Gel 2%  .... Apply once or twice a day to affected area  Other Orders: Venipuncture (14782)  Patient Instructions: 1)  glucose running borderline high and elevated Hgb A1C , to  2)  decrease sweets in diet and increase exercise if possible 3)  will recheck HgbA1Ctoday 4)  Physical exam good otherwise Prescriptions: LIPITOR 40 MG  TABS (ATORVASTATIN CALCIUM) once  daily  #30 x 11   Entered and Authorized by:   Judithann Sheen MD   Signed  by:   Judithann Sheen MD on 02/13/2010   Method used:   Print then Give to Patient   RxID:   [id number] QUINAPRIL-HYDROCHLOROTHIAZIDE 20-12.5 MG  TABS (QUINAPRIL-HYDROCHLOROTHIAZIDE) two times a day  #60 x 11   Entered by:   Alfred Levins, CMA   Authorized by:   Judithann Sheen MD   Signed by:   Alfred Levins, CMA on 02/13/2010   Method used:   Print then Give to Patient   RxID:   [id number] LIPITOR 40 MG  TABS (ATORVASTATIN CALCIUM) once daily  #30 x 2   Entered by:   Alfred Levins, CMA   Authorized by:   Judithann Sheen MD   Signed by:   Alfred Levins, CMA on 02/13/2010   Method used:   Print then Give to Patient   RxID:   [id number] IBUPROFEN 800 MG TABS (IBUPROFEN) three times a day with food  #90 x 11   Entered by:   Alfred Levins, CMA   Authorized by:   Judithann Sheen MD   Signed by:   Alfred Levins, CMA on 02/13/2010   Method used:   Print then Give to Patient   RxID:   [id number]    Orders Added: 1)  Venipuncture [62376] 2)  Specimen Handling [99000] 3)  TLB-A1C / Hgb A1C (Glycohemoglobin) [83036-A1C] 4)  Prescription Created Electronically [[id number]] 5)  Est. Patient Level IV [28315]  Appended Document: cpx//ccm----PT RSC (BMP) // RS----PT [facility] // RS EKG revealed no acute abnormalities or changes from previous EKG

## 2010-03-27 NOTE — Progress Notes (Signed)
Summary: wants med for skin called in   Phone Note Call from Patient   Caller: Patient Summary of Call: states from time to time has skin problem and wants erythromycin topical gel 2% called in along with cephalexin 500mg  three [facility] a day to [facility]  Initial call taken by: Pura Spice, RN,  May 21, 2009 1:54 PM  Follow-up for Phone Call        per dr staffordrx called l in erythromycin topical get and cephalexin 500 mg  Follow-up by: Pura Spice, RN,  May 21, 2009 3:54 PM    New/Updated Medications: CEPHALEXIN 500 MG  CAPS (CEPHALEXIN) 1 by mouth three times a day * ERYTHROMYCIN TOPICAL GEL 2% apply once or twice a day to affected area Prescriptions: CEPHALEXIN 500 MG  CAPS (CEPHALEXIN) 1 by mouth three times a day  #90 x 0   Entered by:   Pura Spice, RN   Authorized by:   Judithann Sheen MD   Signed by:   Pura Spice, RN on 05/21/2009   Method used:   Telephoned to ...       [facility]  [address] [phone]* (retail)       [address]       [place], [place]  [address]       Ph: [phone] or [phone]       Fax: [phone]   RxID:   [id number] ERYTHROMYCIN TOPICAL GEL 2% apply once or twice a day to affected area  #45 gram x 1   Entered by:   Pura Spice, RN   Authorized by:   Judithann Sheen MD   Signed by:   Pura Spice, RN on 05/21/2009   Method used:   Telephoned to ...       [facility]  [address] [phone]* (retail)       [address]       [place], [place]  [address]       Ph: [phone] or [phone]       Fax: [phone]   RxID:   [id number]

## 2010-03-27 NOTE — Progress Notes (Signed)
Summary: Pt has question re: lab results  Phone Note Call from Patient Call back at [phone] cell   Caller: Patient Summary of Call: Pt has question on a lab result. Pls call asap today.  Initial call taken by: Lucy Antigua,  January 10, 2010 8:48 AM  Follow-up for Phone Call        Patient states he had labs done for life insurance  Creatinine was 1.4 (range .5- 1.4) GFR 60 (range 60-99.9) Fructosamine 2.11 (range 1.2 -2.1) Hemoglobin 6.70 (range 0-5.99)  pt would like to know if there is anything he should do with these numbers? Follow-up by: Josph Macho RMA,  January 10, 2010 10:39 AM  Additional Follow-up for Phone Call Additional follow up Details #1::        He must have meant hemoglobin A1C I think. Hemoglobin normal is around 12-14. HGBA1C does not diagnose diabetes but says his sugar is running slightly hi and he needs to minimize carbs. Have 1 carb per meal, brown or complex instead of white. Shoudl repeat the hgba1c with Dr Scotty Court in 3 months to assess for stability. The creatinine is borderline and suggests some dehydration recommend increase clear fluids  signs sugar is getting worse are increased urination and thirst as well as fatigue and abdominal pain, call and see Dr Scotty Court if concerns persist Additional Follow-up by: Danise Edge MD,  January 10, 2010 12:11 PM    Additional Follow-up for Phone Call Additional follow up Details #2::    Patient informed. Follow-up by: Josph Macho RMA,  January 10, 2010 1:24 PM

## 2010-04-15 ENCOUNTER — Telehealth: Payer: Self-pay | Admitting: Family Medicine

## 2010-04-15 NOTE — Telephone Encounter (Signed)
Pt needs to speak with nurse in regards to immunizations he needs, in particularly tetanus.

## 2010-04-15 NOTE — Telephone Encounter (Signed)
Pt called and needs to get date of his last tetanus shot. Pls call asap.

## 2010-04-15 NOTE — Telephone Encounter (Signed)
Spoke with wife and per our records--  no tetanus immunization found in chart. Stated [place]. Informed them to [employer] health dept to see what they may need for that particular country.

## 2010-05-07 ENCOUNTER — Other Ambulatory Visit (INDEPENDENT_AMBULATORY_CARE_PROVIDER_SITE_OTHER): Payer: Medicare Other

## 2010-05-07 DIAGNOSIS — R7309 Other abnormal glucose: Secondary | ICD-10-CM

## 2010-05-07 DIAGNOSIS — R739 Hyperglycemia, unspecified: Secondary | ICD-10-CM

## 2010-05-15 ENCOUNTER — Telehealth: Payer: Self-pay | Admitting: *Deleted

## 2010-05-15 NOTE — Telephone Encounter (Signed)
Pt would like lab results and copy of immunizations, please.

## 2010-05-15 NOTE — Telephone Encounter (Signed)
Attempted to call pt no answering machine///will return call

## 2010-05-16 ENCOUNTER — Other Ambulatory Visit: Payer: Self-pay

## 2010-05-16 NOTE — Telephone Encounter (Signed)
Kevin Wise please call pt on [phone]

## 2010-05-16 NOTE — Telephone Encounter (Signed)
Pt is aware A1C is good;

## 2010-05-21 ENCOUNTER — Other Ambulatory Visit: Payer: Self-pay

## 2010-05-23 ENCOUNTER — Encounter: Payer: Self-pay | Admitting: Family Medicine

## 2010-06-06 NOTE — Progress Notes (Signed)
Pt aware.

## 2010-09-12 ENCOUNTER — Other Ambulatory Visit: Payer: Medicare Other

## 2010-09-12 ENCOUNTER — Other Ambulatory Visit (INDEPENDENT_AMBULATORY_CARE_PROVIDER_SITE_OTHER): Payer: Medicare Other

## 2010-09-12 DIAGNOSIS — E119 Type 2 diabetes mellitus without complications: Secondary | ICD-10-CM

## 2010-09-12 LAB — HEMOGLOBIN A1C: Hgb A1c MFr Bld: 7.1 % — ABNORMAL HIGH (ref 4.6–6.5)

## 2010-09-15 ENCOUNTER — Telehealth: Payer: Self-pay

## 2010-09-15 NOTE — Telephone Encounter (Signed)
Called to give pt lab resutls; left a message for pt to return call.  Will attempt to call pt again

## 2010-09-17 ENCOUNTER — Telehealth: Payer: Self-pay

## 2010-09-17 DIAGNOSIS — R739 Hyperglycemia, unspecified: Secondary | ICD-10-CM

## 2010-09-17 NOTE — Telephone Encounter (Signed)
Pt called to get lab results.  Pt is aware and has set up an appt for 3 months to recheck hga1c.

## 2010-11-20 ENCOUNTER — Encounter: Payer: Self-pay | Admitting: Family Medicine

## 2010-11-20 ENCOUNTER — Ambulatory Visit (INDEPENDENT_AMBULATORY_CARE_PROVIDER_SITE_OTHER): Payer: Medicare Other | Admitting: Family Medicine

## 2010-11-20 VITALS — BP 102/72 | Temp 98.2°F | Wt 181.0 lb

## 2010-11-20 DIAGNOSIS — Z23 Encounter for immunization: Secondary | ICD-10-CM

## 2010-11-20 DIAGNOSIS — M6530 Trigger finger, unspecified finger: Secondary | ICD-10-CM

## 2010-11-20 NOTE — Patient Instructions (Signed)
Trigger Finger Digital Tendinitis and StenosingTenosynovitis Trigger finger is a common disorder that causes an often painful catching of the fingers or thumb. It occurs as a clicking, snapping or locking of a finger in the palm of the hand. The reason for this is that there is a problem with the tendons which flex the fingers sliding smoothly through their sheaths. The cause of this may be inflammation of the tendon and sheath, or from a thickening or nodule in the tendon. The condition may occur in any finger or a couple fingers at the same time. The cause may be overuse while doing the same activity over and over again with your hands.  Tendons are the tough cords that connect the muscles to bones. Muscles and tendons are part of the system which allows your body to move. When muscles contract in the forearm on the palm side, they pull the tendons toward the elbow and cause the fingers and thumb to bend (flex) toward the palm. These are the flexor tendons. The tendons slide through a slippery smooth membrane (synovium) which is called the tendon sheath. The sheaths have areas of tough fibrous tissues surrounding them which hold the tendons close to the bone. These are called pulleys because they work like a pulley. The first pulley is in the palm of the hand near the crease which runs across your palm. If the area of the tendon thickening is near the pulley, the tendon cannot slide smoothly through the pulley and this causes the trigger finger. The finger may lock with the finger curled or suddenly straighten out with a snap. This is more common in patients with rheumatoid arthritis and diabetes. Left untreated, the condition may get worse to the point where the finger becomes locked in flexion, like making a fist, or less commonly locked with the finger straightened out. DIAGNOSIS Your caregiver will easily make this diagnosis on examination. TREATMENT  Splinting for 6 to 8 weeks of time may be helpful.  Use the splints as your caregiver suggests.   Heat used for twenty minutes at least four times a day followed by ice packs for twenty minutes unless directed otherwise by your caregiver may be helpful. If you find either heat or cold seems to be making the problem worse, quit using them and ask your caregiver for directions.   Cortisone injections along with splinting may speed up recovery. Several injections may be required. Cortisone may give relief after one injection.   Only take over-the-counter or prescription medicines for pain, discomfort, or fever as directed by your caregiver.   Surgery is another treatment that may be used if conservative treatments using injection and splinting does not work. Surgery can be minor without incisions (a cut does not have to be made) and can be done with a needle through the skin. No stitches are needed and most patients may return to work the same day.   Other surgical choices involve an open procedure where the surgeon opens the hand through a small incision (cut) and cuts the pulley so the tendon can again slide smoothly. Your hand will still work fine. This small operation requires stitches and the recovery will be a little longer and the incisions will need to be protected until completely healed. You may have to limit your activities for up to 6 months.   Occupational or hand therapy may be required if there is stiffness remaining in the finger.  CARE AFTER SURGERY  Elevate your hand above your heart  and use ice as instructed.   Follow instructions regarding finger motion/exercise.   Keep the surgical wound dry for at least 48 hrs or longer if instructed.   Keep your follow-up appointments.   Return to work and normal activities as instructed.  SEEK IMMEDIATE MEDICAL CARE IF: Your problems are getting worse or you do not obtain relief from the treatment. COMPLICATIONS Complications are uncommon but some problems that may occur  are:  Recurrence of the trigger finger. This does not mean that the surgery was not well done. It simply means that you may have formed scar tissue following surgery that causes the problem to reoccur.   Infection which could ruin the results of the surgery and can result in a finger which is frozen and can not move normally.   Nerve injury is possible which could result in permanent numbness of one or more fingers.  Document Released: 11/30/2003 Document Re-Released: 05/06/2009 [facility] Patient Information 2011 [place], [place].

## 2010-11-20 NOTE — Progress Notes (Signed)
  Subjective:    Patient ID: Kevin Wise, male    DOB: December 29, 1935, 75 y.o.   MRN: [id number]  HPI Right hand issue for the past few months. He describes trigger finger right middle finger. No significant pain. Symptoms generally improve throughout the day. He takes Motrin 800 mg daily but no other treatment. Also has occasional pains distal and proximal interphalangeal joints of both hands. No redness or warmth. No visible swelling. Right-hand dominant.  Patient requesting flu vaccine. No contraindications. Chronic problems are hyperlipidemia and hypertension.   Review of Systems  Skin: Negative for rash.  Neurological: Negative for weakness and numbness.  Hematological: Negative for adenopathy.       Objective:   Physical Exam  Constitutional: He appears well-developed and well-nourished.  Cardiovascular: Normal rate and regular rhythm.   Pulmonary/Chest: Effort normal and breath sounds normal. No respiratory distress. He has no wheezes. He has no rales.  Musculoskeletal:       Right hand reveals minimally tender nodule involving tendon of right middle finger proximal to the MCP joint. Fluid range of motion at this time.  No significant nodules DIP/PIP joints          Assessment & Plan:  Trigger finger right middle finger. Discussed options including corticosteroid injection and patient wishes to observe this time. He'll continue Motrin and try heat or ice for symptom relief.  Flu vaccine given

## 2010-11-27 ENCOUNTER — Ambulatory Visit: Payer: Medicare Other | Admitting: Family Medicine

## 2010-12-08 ENCOUNTER — Other Ambulatory Visit (INDEPENDENT_AMBULATORY_CARE_PROVIDER_SITE_OTHER): Payer: Medicare Other

## 2010-12-08 DIAGNOSIS — R7309 Other abnormal glucose: Secondary | ICD-10-CM

## 2010-12-08 DIAGNOSIS — R739 Hyperglycemia, unspecified: Secondary | ICD-10-CM

## 2010-12-12 ENCOUNTER — Other Ambulatory Visit: Payer: Medicare Other

## 2010-12-12 NOTE — Progress Notes (Signed)
Quick Note:  Pt is aware. Pt states he has labs set up for 02/25/11. Pt is aware his hgA1c can be checked at that time. ______

## 2011-02-19 ENCOUNTER — Other Ambulatory Visit: Payer: Self-pay | Admitting: Family Medicine

## 2011-02-25 ENCOUNTER — Other Ambulatory Visit (INDEPENDENT_AMBULATORY_CARE_PROVIDER_SITE_OTHER): Payer: Medicare Other

## 2011-02-25 DIAGNOSIS — R739 Hyperglycemia, unspecified: Secondary | ICD-10-CM

## 2011-02-25 DIAGNOSIS — E785 Hyperlipidemia, unspecified: Secondary | ICD-10-CM

## 2011-02-25 DIAGNOSIS — R7309 Other abnormal glucose: Secondary | ICD-10-CM

## 2011-02-25 DIAGNOSIS — Z0000 Encounter for general adult medical examination without abnormal findings: Secondary | ICD-10-CM

## 2011-02-25 DIAGNOSIS — Z125 Encounter for screening for malignant neoplasm of prostate: Secondary | ICD-10-CM

## 2011-02-25 LAB — HEMOGLOBIN A1C: Hgb A1c MFr Bld: 6.8 % — ABNORMAL HIGH (ref 4.6–6.5)

## 2011-02-25 LAB — POCT URINALYSIS DIPSTICK
Blood, UA: NEGATIVE
Glucose, UA: NEGATIVE
Ketones, UA: NEGATIVE
Spec Grav, UA: 1.025
Urobilinogen, UA: 0.2

## 2011-02-25 LAB — BASIC METABOLIC PANEL
CO2: 30 mEq/L (ref 19–32)
Calcium: 9.1 mg/dL (ref 8.4–10.5)
Creatinine, Ser: 1 mg/dL (ref 0.4–1.5)
Glucose, Bld: 108 mg/dL — ABNORMAL HIGH (ref 70–99)

## 2011-02-25 LAB — CBC WITH DIFFERENTIAL/PLATELET
Basophils Absolute: 0 10*3/uL (ref 0.0–0.1)
Eosinophils Absolute: 0.1 10*3/uL (ref 0.0–0.7)
Lymphocytes Relative: 37 % (ref 12.0–46.0)
MCHC: 33.6 g/dL (ref 30.0–36.0)
Neutrophils Relative %: 54.6 % (ref 43.0–77.0)
Platelets: 225 10*3/uL (ref 150.0–400.0)
RDW: 13.4 % (ref 11.5–14.6)

## 2011-02-25 LAB — LIPID PANEL
Cholesterol: 139 mg/dL (ref 0–200)
HDL: 45.5 mg/dL (ref >39.00)
LDL Cholesterol: 76 mg/dL (ref 0–99)
Total CHOL/HDL Ratio: 3
Triglycerides: 90 mg/dL (ref 0.0–149.0)

## 2011-02-25 LAB — HEPATIC FUNCTION PANEL
Alkaline Phosphatase: 49 U/L (ref 39–117)
Bilirubin, Direct: 0.1 mg/dL (ref 0.0–0.3)

## 2011-03-03 ENCOUNTER — Encounter: Payer: Medicare Other | Admitting: Family Medicine

## 2011-03-06 ENCOUNTER — Telehealth: Payer: Self-pay | Admitting: *Deleted

## 2011-03-06 ENCOUNTER — Encounter: Payer: Self-pay | Admitting: Family Medicine

## 2011-03-06 ENCOUNTER — Ambulatory Visit (INDEPENDENT_AMBULATORY_CARE_PROVIDER_SITE_OTHER): Payer: Medicare Other | Admitting: Family Medicine

## 2011-03-06 VITALS — BP 120/78 | HR 72 | Temp 98.0°F | Resp 12 | Ht 68.0 in | Wt 183.0 lb

## 2011-03-06 DIAGNOSIS — E785 Hyperlipidemia, unspecified: Secondary | ICD-10-CM

## 2011-03-06 DIAGNOSIS — I10 Essential (primary) hypertension: Secondary | ICD-10-CM

## 2011-03-06 DIAGNOSIS — F528 Other sexual dysfunction not due to a substance or known physiological condition: Secondary | ICD-10-CM

## 2011-03-06 DIAGNOSIS — Z0000 Encounter for general adult medical examination without abnormal findings: Secondary | ICD-10-CM

## 2011-03-06 MED ORDER — SILDENAFIL CITRATE 100 MG PO TABS: 100.0000 mg | ORAL_TABLET | Freq: Every day | ORAL | Status: DC | PRN

## 2011-03-06 MED ORDER — IBUPROFEN 800 MG PO TABS: 800.0000 mg | ORAL_TABLET | Freq: Three times a day (TID) | ORAL | Status: DC | PRN

## 2011-03-06 MED ORDER — QUINAPRIL-HYDROCHLOROTHIAZIDE 20-12.5 MG PO TABS: 1.0000 | ORAL_TABLET | Freq: Two times a day (BID) | ORAL | Status: DC

## 2011-03-06 MED ORDER — ATORVASTATIN CALCIUM 40 MG PO TABS: 40.0000 mg | ORAL_TABLET | Freq: Every day | ORAL | Status: DC

## 2011-03-06 NOTE — Telephone Encounter (Signed)
Pt's last colonscopy was 05/09/2010.  He wants to speak to [place] about his next appt.  He understood he was to have labs, and needs to know if he can eat.

## 2011-03-06 NOTE — Progress Notes (Signed)
Subjective:    Patient ID: Kevin Wise, male    DOB: 10-Nov-1935, 76 y.o.   MRN: [id number]  HPI  Patient here for complete physical and followup medical problems. His medical history is significant for hyperlipidemia, erectile dysfunction, hypertension, and hyperglycemia. He has made lifestyle changes during the past year with increased exercise and some weight loss. Overall feels very well. Blood pressure stable. No orthostasis. Nonsmoker.  Needs refills of all medications. Uses ibuprofen intermittently for arthritis issues. Other medications regularly. Compliant with all medications.  Denies any chest pains, dizziness, or shortness of breath. Flu vaccine are given. Pneumovax up-to-date. Needs tetanus booster. No history of shingles vaccine. Colonoscopy last year.  Past Medical History  Diagnosis Date  . HYPOTHYROIDISM 01/10/2008  . VITAMIN D DEFICIENCY 01/10/2008  . HYPERLIPIDEMIA 01/06/2007  . ANEMIA 01/10/2008  . ERECTILE DYSFUNCTION 01/06/2007  . HYPERTENSION, BENIGN ESSENTIAL 01/06/2007  . CAROTID ARTERY STENOSIS, RIGHT 01/10/2009  . Rosacea 01/06/2007  . ARTHRITIS 01/06/2007  . HYPERGLYCEMIA, BORDERLINE 12/02/2009  . Other symptoms involving cardiovascular system 01/10/2009  . UNS ADVRS EFF OTH RX MEDICINAL&BIOLOGICAL SBSTNC 01/10/2008  . LIVER FUNCTION TESTS, ABNORMAL, HX OF 12/02/2009   No past surgical history on file.  has an unknown smoking status. He does not have any smokeless tobacco history on file. His alcohol and drug histories not on file. family history includes Diabetes in his mother and Hyperlipidemia in his mother. No Known Allergies    Review of Systems  Constitutional: Negative for fever, activity change, appetite change and fatigue.  HENT: Negative for ear pain, congestion and trouble swallowing.   Eyes: Negative for pain and visual disturbance.  Respiratory: Negative for cough, shortness of breath and wheezing.   Cardiovascular: Negative for chest  pain and palpitations.  Gastrointestinal: Negative for nausea, vomiting, abdominal pain, diarrhea, constipation, blood in stool, abdominal distention and rectal pain.  Genitourinary: Negative for dysuria, hematuria and testicular pain.  Musculoskeletal: Negative for joint swelling and arthralgias.  Skin: Negative for rash.  Neurological: Negative for dizziness, syncope and headaches.  Hematological: Negative for adenopathy.  Psychiatric/Behavioral: Negative for confusion and dysphoric mood.       Objective:   Physical Exam  Constitutional: He is oriented to person, place, and time. He appears well-developed and well-nourished. No distress.  HENT:  Head: Normocephalic and atraumatic.  Right Ear: External ear normal.  Left Ear: External ear normal.  Mouth/Throat: Oropharynx is clear and moist.  Eyes: Conjunctivae and EOM are normal. Pupils are equal, round, and reactive to light.  Neck: Normal range of motion. Neck supple. No thyromegaly present.  Cardiovascular: Normal rate, regular rhythm and normal heart sounds.   No murmur heard. Pulmonary/Chest: No respiratory distress. He has no wheezes. He has no rales.  Abdominal: Soft. Bowel sounds are normal. He exhibits no distension and no mass. There is no tenderness. There is no rebound and no guarding.  Musculoskeletal: He exhibits no edema.  Lymphadenopathy:    He has no cervical adenopathy.  Neurological: He is alert and oriented to person, place, and time. He displays normal reflexes. No cranial nerve deficit.  Skin: No rash noted.  Psychiatric: He has a normal mood and affect.          Assessment & Plan:  #1 health maintenance. Shingles vaccine prescription written. Patient will consider tetanus booster later and wishes to decline today. Colonoscopy up-to-date. Discussed healthy lifestyle habits with exercise and diet #2 hypertension. Stable. Refill Accuretic for one year #3 hyperlipidemia. Lipids at goal.  Refill Lipitor for  one year. #4 erectile dysfunction. Refill Viagra

## 2011-03-06 NOTE — Telephone Encounter (Signed)
Patient is aware and he received a shingles vaccine today

## 2011-03-06 NOTE — Telephone Encounter (Signed)
Yes.  He may eat.

## 2011-03-06 NOTE — Telephone Encounter (Signed)
Please advise 

## 2011-03-10 ENCOUNTER — Telehealth: Payer: Self-pay | Admitting: *Deleted

## 2011-03-10 NOTE — Telephone Encounter (Signed)
Pt wants Harriett Sine to know his last Tetanus injection was 04/17/1910.

## 2011-03-10 NOTE — Telephone Encounter (Signed)
Spoke to pt- Pt did have a Tdap 04/17/10 at [facility]. Documented in chart.

## 2011-03-31 ENCOUNTER — Telehealth: Payer: Self-pay | Admitting: *Deleted

## 2011-03-31 NOTE — Telephone Encounter (Signed)
Don't see any reason not to but depends on dietary intake.

## 2011-03-31 NOTE — Telephone Encounter (Signed)
Pt wants to know if Dr. [name] would like for him to continue taking Calcium?

## 2011-04-01 NOTE — Telephone Encounter (Signed)
LMTCB

## 2011-04-01 NOTE — Telephone Encounter (Signed)
Pt called back, educated on Calcium

## 2011-04-22 NOTE — Progress Notes (Signed)
Quick Note:  Pt informed ______ 

## 2011-05-22 ENCOUNTER — Other Ambulatory Visit: Payer: Self-pay | Admitting: Family Medicine

## 2011-06-01 ENCOUNTER — Telehealth: Payer: Self-pay | Admitting: Family Medicine

## 2011-06-01 DIAGNOSIS — R7309 Other abnormal glucose: Secondary | ICD-10-CM

## 2011-06-01 NOTE — Telephone Encounter (Signed)
Addended by: Melchor Amour on: 06/01/2011 05:41 PM   Modules accepted: Orders

## 2011-06-01 NOTE — Telephone Encounter (Signed)
I have looked and do not see need for labs? Please advise

## 2011-06-01 NOTE — Telephone Encounter (Signed)
I would recommend A1C and follow up by July (6 months from last labs)

## 2011-06-01 NOTE — Telephone Encounter (Signed)
Patient called to reschedule his appt and stated he is to only have labs to check his blood sugar and not an office visit. Please advise. Call back number [phone].

## 2011-06-05 ENCOUNTER — Ambulatory Visit: Payer: Medicare Other | Admitting: Family Medicine

## 2011-06-12 ENCOUNTER — Ambulatory Visit: Payer: Medicare Other | Admitting: Family Medicine

## 2011-07-31 ENCOUNTER — Other Ambulatory Visit (INDEPENDENT_AMBULATORY_CARE_PROVIDER_SITE_OTHER): Payer: Medicare Other

## 2011-07-31 ENCOUNTER — Other Ambulatory Visit: Payer: Medicare Other

## 2011-07-31 DIAGNOSIS — R7309 Other abnormal glucose: Secondary | ICD-10-CM

## 2011-08-04 NOTE — Progress Notes (Signed)
Quick Note:  Pt wife informed, pt has OV in 3 days ______

## 2011-08-07 ENCOUNTER — Encounter: Payer: Self-pay | Admitting: Family Medicine

## 2011-08-07 ENCOUNTER — Ambulatory Visit (INDEPENDENT_AMBULATORY_CARE_PROVIDER_SITE_OTHER): Payer: Medicare Other | Admitting: Family Medicine

## 2011-08-07 VITALS — BP 130/80 | Temp 98.0°F | Wt 182.0 lb

## 2011-08-07 DIAGNOSIS — I10 Essential (primary) hypertension: Secondary | ICD-10-CM

## 2011-08-07 DIAGNOSIS — M6530 Trigger finger, unspecified finger: Secondary | ICD-10-CM

## 2011-08-07 DIAGNOSIS — E119 Type 2 diabetes mellitus without complications: Secondary | ICD-10-CM | POA: Insufficient documentation

## 2011-08-07 DIAGNOSIS — E785 Hyperlipidemia, unspecified: Secondary | ICD-10-CM

## 2011-08-07 NOTE — Progress Notes (Signed)
  Subjective:    Patient ID: Kevin Wise, male    DOB: 11/28/1935, 76 y.o.   MRN: [id number]  HPI  Medical followup. Patient history 2 diabetes, hypertension, hyperlipidemia. Recent A1c last week 7.1%. Does not monitor blood sugars regular. No symptoms of hyperglycemia. Controlled with diet and exercise. Stable for several years. Hyperlipidemia treated with Lipitor. No myalgias. Blood pressure stable with quinapril HCTZ. No dizziness or orthostasis. Denies any chest pains. Exercise about 3 days per week. Patient continues to get yearly exams. No history of retinopathy.  Intermittent trigger finger right middle finger. Mostly noted early morning. No pain. No alleviating factors. Not disabling at this time.  Past Medical History  Diagnosis Date  . HYPOTHYROIDISM 01/10/2008  . VITAMIN D DEFICIENCY 01/10/2008  . HYPERLIPIDEMIA 01/06/2007  . ANEMIA 01/10/2008  . ERECTILE DYSFUNCTION 01/06/2007  . HYPERTENSION, BENIGN ESSENTIAL 01/06/2007  . CAROTID ARTERY STENOSIS, RIGHT 01/10/2009  . Rosacea 01/06/2007  . ARTHRITIS 01/06/2007  . HYPERGLYCEMIA, BORDERLINE 12/02/2009  . Other symptoms involving cardiovascular system 01/10/2009  . UNS ADVRS EFF OTH RX MEDICINAL&BIOLOGICAL SBSTNC 01/10/2008  . LIVER FUNCTION TESTS, ABNORMAL, HX OF 12/02/2009   No past surgical history on file.  has an unknown smoking status. He does not have any smokeless tobacco history on file. His alcohol and drug histories not on file. family history includes Diabetes in his mother and Hyperlipidemia in his mother. No Known Allergies    Review of Systems  Constitutional: Negative for fatigue.  Eyes: Negative for visual disturbance.  Respiratory: Negative for cough, chest tightness and shortness of breath.   Cardiovascular: Negative for chest pain, palpitations and leg swelling.  Neurological: Negative for dizziness, syncope, weakness, light-headedness and headaches.       Objective:   Physical Exam    Constitutional: He appears well-developed and well-nourished.  Neck: Neck supple. No thyromegaly present.  Cardiovascular: Normal rate and regular rhythm.   Pulmonary/Chest: Effort normal and breath sounds normal. No respiratory distress. He has no wheezes. He has no rales.  Musculoskeletal: He exhibits no edema.       Patient has calluses both feet just proximal to the fifth metatarsophalangeal joint. No ulceration. Normal sensory function. Good foot pulses.  Right hand examined. We could not locate any tender nodules in the hand. No reproducible trigger finger at this time  Lymphadenopathy:    He has no cervical adenopathy.          Assessment & Plan:  #1 type 2 diabetes. History of control with diet and exercise. Diet and exercise discussed. Recheck A1c in 6 months. No clear indication for medication yet #2 hyperlipidemia which has been well controlled with Lipitor continue current medication  #3 hypertension stable.  Continue quinapril HCTZ.  #4 right middle finger trigger finger. Symptoms relatively mild. We did not recommend injection today since we cannot locate any clear nodule

## 2011-11-13 ENCOUNTER — Encounter: Payer: Self-pay | Admitting: Family Medicine

## 2011-11-13 ENCOUNTER — Ambulatory Visit (INDEPENDENT_AMBULATORY_CARE_PROVIDER_SITE_OTHER): Payer: Medicare Other | Admitting: Family Medicine

## 2011-11-13 VITALS — BP 112/68 | Temp 98.0°F | Wt 188.0 lb

## 2011-11-13 DIAGNOSIS — M6530 Trigger finger, unspecified finger: Secondary | ICD-10-CM

## 2011-11-13 DIAGNOSIS — M65331 Trigger finger, right middle finger: Secondary | ICD-10-CM

## 2011-11-13 DIAGNOSIS — M542 Cervicalgia: Secondary | ICD-10-CM

## 2011-11-13 DIAGNOSIS — Z23 Encounter for immunization: Secondary | ICD-10-CM

## 2011-11-13 MED ORDER — METAXALONE 800 MG PO TABS: 800.0000 mg | ORAL_TABLET | Freq: Three times a day (TID) | ORAL | Status: DC

## 2011-11-13 NOTE — Progress Notes (Signed)
  Subjective:    Patient ID: Kevin Wise, male    DOB: [birth year], 76 y.o.   MRN: [id number]  HPI  Left-sided neck pain. Onset about 2 weeks ago. No injury. Did fall asleep one night on the couch with neck bent to left. No radiculopathy symptoms. No numbness. No weakness. Pain 6/10 and worse at night. No chest pain. No numbness or weakness. Ibuprofen with mild relief. Intermittent muscle soreness and tension to palpation.  Right middle finger trigger finger. Usually wakes up in the morning and has to forcefully opened his middle finger. No associated pain. We could not appreciate definite nodule last visit. No weakness.   Review of Systems  Constitutional: Negative for fever and chills.  Respiratory: Negative for cough and shortness of breath.   Cardiovascular: Negative for chest pain.  Neurological: Negative for weakness and numbness.  Hematological: Negative for adenopathy.       Objective:   Physical Exam  Constitutional: He appears well-developed and well-nourished.  Neck: Neck supple. No thyromegaly present.  Cardiovascular: Normal rate and regular rhythm.   Pulmonary/Chest: Effort normal and breath sounds normal. No respiratory distress. He has no wheezes. He has no rales.  Musculoskeletal: He exhibits no edema.       Full range of motion cervical spine Right hand examined. No definite nodules appreciated in the hand. He has fluid and full range of motion all digits of the right hand at this time  Lymphadenopathy:    He has no cervical adenopathy.  Neurological:       Full-strength upper extremities. Symmetric reflexes. Normal sensory function.          Assessment & Plan:  #1 left-sided neck pain. Suspect muscular. Continue moist heat. Cautious trial Skelaxin 800 mg every 8 hours as needed. #2 right middle finger trigger finger. Minimally problematic. Again, cannot locate tender nodule today and he wishes to observe. Offered referral to hand surgeon but he wishes to  wait

## 2012-03-11 ENCOUNTER — Other Ambulatory Visit: Payer: Medicare Other

## 2012-03-14 ENCOUNTER — Ambulatory Visit (INDEPENDENT_AMBULATORY_CARE_PROVIDER_SITE_OTHER): Payer: Medicare Other | Admitting: Family Medicine

## 2012-03-14 ENCOUNTER — Telehealth: Payer: Self-pay | Admitting: *Deleted

## 2012-03-14 ENCOUNTER — Encounter: Payer: Self-pay | Admitting: Family Medicine

## 2012-03-14 VITALS — BP 120/70 | Temp 98.7°F | Wt 192.0 lb

## 2012-03-14 DIAGNOSIS — J209 Acute bronchitis, unspecified: Secondary | ICD-10-CM

## 2012-03-14 MED ORDER — HYDROCODONE-HOMATROPINE 5-1.5 MG/5ML PO SYRP: 5.0000 mL | ORAL_SOLUTION | Freq: Four times a day (QID) | ORAL | Status: DC | PRN

## 2012-03-14 MED ORDER — HYDROCOD POLST-CHLORPHEN POLST 10-8 MG/5ML PO LQCR: 5.0000 mL | Freq: Two times a day (BID) | ORAL | Status: DC | PRN

## 2012-03-14 NOTE — Telephone Encounter (Signed)
VM from pt pharmacy, [facility].  Tussionex is not covered by Medicare part D.  Pt asking if there is something cheaper Dr [name] could order.

## 2012-03-14 NOTE — Progress Notes (Signed)
  Subjective:    Patient ID: Kevin Wise, male    DOB: March 31, 1935, 77 y.o.   MRN: [id number]  HPI  Onset 4-5 days of cough, mostly dry. No fever.  Walked one hour this AM without difficulty Some postnasal drip, clear. No wheezing.  No body aches. Nonsmoker. Nyquil with minimal relief.  No GERD. No dyspnea. No pleuritic pain. No hemoptysis   Review of Systems  Constitutional: Negative for fever and chills.  HENT: Positive for congestion and postnasal drip.   Respiratory: Positive for cough.        Objective:   Physical Exam  Constitutional: He appears well-developed and well-nourished.  HENT:  Right Ear: External ear normal.  Left Ear: External ear normal.  Mouth/Throat: Oropharynx is clear and moist.  Neck: Neck supple. No thyromegaly present.  Cardiovascular: Normal rate and regular rhythm.   Pulmonary/Chest: Effort normal and breath sounds normal. No respiratory distress. He has no wheezes. He has no rales.  Musculoskeletal: He exhibits no edema.  Lymphadenopathy:    He has no cervical adenopathy.          Assessment & Plan:  Acute bronchitis. Suspect viral. Tussionex cough syrup for nighttime use only. Followup as needed

## 2012-03-14 NOTE — Telephone Encounter (Signed)
Hycodan one tsp po q 6 hours prn cough, disp 120 ml with no refills.

## 2012-03-14 NOTE — Patient Instructions (Addendum)

## 2012-03-18 ENCOUNTER — Encounter: Payer: Medicare Other | Admitting: Family Medicine

## 2012-03-22 ENCOUNTER — Telehealth: Payer: Self-pay | Admitting: Family Medicine

## 2012-03-22 NOTE — Telephone Encounter (Signed)
Pt instructed to try plain Mucinex, pt voiced understanding

## 2012-03-22 NOTE — Telephone Encounter (Signed)
Pt was seen on 1-20 for bronchitis. Pt stated cough is better however he still has nasal and chest congestion. Please advise pharm [facility]

## 2012-03-28 ENCOUNTER — Other Ambulatory Visit: Payer: Self-pay | Admitting: Family Medicine

## 2012-04-01 ENCOUNTER — Encounter: Payer: Self-pay | Admitting: Internal Medicine

## 2012-04-11 ENCOUNTER — Other Ambulatory Visit (INDEPENDENT_AMBULATORY_CARE_PROVIDER_SITE_OTHER): Payer: Medicare Other

## 2012-04-11 DIAGNOSIS — Z0000 Encounter for general adult medical examination without abnormal findings: Secondary | ICD-10-CM

## 2012-04-11 DIAGNOSIS — IMO0001 Reserved for inherently not codable concepts without codable children: Secondary | ICD-10-CM

## 2012-04-11 LAB — BASIC METABOLIC PANEL
BUN: 18 mg/dL (ref 6–23)
Calcium: 9.3 mg/dL (ref 8.4–10.5)
GFR: 80.7 mL/min (ref >60.00)
Potassium: 4.9 mEq/L (ref 3.5–5.1)
Sodium: 137 mEq/L (ref 135–145)

## 2012-04-11 LAB — CBC WITH DIFFERENTIAL/PLATELET
Basophils Absolute: 0.1 10*3/uL (ref 0.0–0.1)
Eosinophils Relative: 4.6 % (ref 0.0–5.0)
HCT: 40.5 % (ref 39.0–52.0)
Lymphocytes Relative: 35.8 % (ref 12.0–46.0)
Lymphs Abs: 3.1 10*3/uL (ref 0.7–4.0)
Monocytes Relative: 7.1 % (ref 3.0–12.0)
Neutrophils Relative %: 51.8 % (ref 43.0–77.0)
Platelets: 210 10*3/uL (ref 150.0–400.0)
WBC: 8.6 10*3/uL (ref 4.5–10.5)

## 2012-04-11 LAB — POCT URINALYSIS DIPSTICK
Leukocytes, UA: NEGATIVE
Nitrite, UA: NEGATIVE
pH, UA: 6

## 2012-04-11 LAB — MICROALBUMIN / CREATININE URINE RATIO
Creatinine,U: 124.7 mg/dL
Microalb Creat Ratio: 4.8 mg/g (ref 0.0–30.0)
Microalb, Ur: 6 mg/dL — ABNORMAL HIGH (ref 0.0–1.9)

## 2012-04-11 LAB — PSA: PSA: 1.04 ng/mL (ref 0.10–4.00)

## 2012-04-11 LAB — HEPATIC FUNCTION PANEL
ALT: 25 U/L (ref 0–53)
AST: 26 U/L (ref 0–37)
Alkaline Phosphatase: 50 U/L (ref 39–117)
Bilirubin, Direct: 0.1 mg/dL (ref 0.0–0.3)
Total Bilirubin: 0.8 mg/dL (ref 0.3–1.2)

## 2012-04-11 LAB — HEMOGLOBIN A1C: Hgb A1c MFr Bld: 9.4 % — ABNORMAL HIGH (ref 4.6–6.5)

## 2012-04-11 NOTE — Addendum Note (Signed)
Addended by: Bonnye Fava on: 04/11/2012 08:49 AM   Modules accepted: Orders

## 2012-04-18 ENCOUNTER — Ambulatory Visit (INDEPENDENT_AMBULATORY_CARE_PROVIDER_SITE_OTHER): Payer: Medicare Other | Admitting: Family Medicine

## 2012-04-18 ENCOUNTER — Encounter: Payer: Self-pay | Admitting: Family Medicine

## 2012-04-18 VITALS — BP 120/80 | HR 72 | Temp 98.0°F | Resp 12 | Ht 68.0 in | Wt 190.0 lb

## 2012-04-18 DIAGNOSIS — E785 Hyperlipidemia, unspecified: Secondary | ICD-10-CM

## 2012-04-18 DIAGNOSIS — E119 Type 2 diabetes mellitus without complications: Secondary | ICD-10-CM

## 2012-04-18 DIAGNOSIS — I10 Essential (primary) hypertension: Secondary | ICD-10-CM

## 2012-04-18 DIAGNOSIS — Z0000 Encounter for general adult medical examination without abnormal findings: Secondary | ICD-10-CM

## 2012-04-18 MED ORDER — ATORVASTATIN CALCIUM 40 MG PO TABS: 40.0000 mg | ORAL_TABLET | Freq: Every day | ORAL | Status: DC

## 2012-04-18 MED ORDER — QUINAPRIL-HYDROCHLOROTHIAZIDE 20-12.5 MG PO TABS: 1.0000 | ORAL_TABLET | Freq: Two times a day (BID) | ORAL | Status: DC

## 2012-04-18 MED ORDER — IBUPROFEN 800 MG PO TABS: 800.0000 mg | ORAL_TABLET | Freq: Three times a day (TID) | ORAL | Status: DC | PRN

## 2012-04-18 MED ORDER — METFORMIN HCL 500 MG PO TABS: 500.0000 mg | ORAL_TABLET | Freq: Two times a day (BID) | ORAL | Status: DC

## 2012-04-18 MED ORDER — GLUCOSE BLOOD VI STRP: ORAL_STRIP | Status: DC

## 2012-04-18 MED ORDER — SILDENAFIL CITRATE 100 MG PO TABS
100.0000 mg | ORAL_TABLET | Freq: Every day | ORAL | Status: DC | PRN
Start: 2012-04-18 — End: 2012-04-18

## 2012-04-18 MED ORDER — SILDENAFIL CITRATE 100 MG PO TABS: 100.0000 mg | ORAL_TABLET | Freq: Every day | ORAL | Status: DC | PRN

## 2012-04-18 MED ORDER — BAYER MICROLET LANCETS MISC: Status: DC

## 2012-04-18 NOTE — Progress Notes (Signed)
Subjective:    Patient ID: Kevin Wise, male    DOB: [birth year], 77 y.o.   MRN: [id number]  HPI  Patient seen for complete physical and medical followup. His chronic problems are reviewed and as below. He needs refills of several medications today. Compliant with all. He said history diabetes which is been managed previously with diet exercise. No symptoms of hyperglycemia.  Erectile dysfunction treated with Viagra. This continues to work well for him and he is requesting refills.  Health maintenance issues addressed. Immunizations up to date. Repeat colonoscopy last year. Exercises with walking about 2-3 days per week. Nonsmoker.  Past Medical History  Diagnosis Date  . HYPOTHYROIDISM 01/10/2008  . VITAMIN D DEFICIENCY 01/10/2008  . HYPERLIPIDEMIA 01/06/2007  . ANEMIA 01/10/2008  . ERECTILE DYSFUNCTION 01/06/2007  . HYPERTENSION, BENIGN ESSENTIAL 01/06/2007  . CAROTID ARTERY STENOSIS, RIGHT 01/10/2009  . Rosacea 01/06/2007  . ARTHRITIS 01/06/2007  . HYPERGLYCEMIA, BORDERLINE 12/02/2009  . Other symptoms involving cardiovascular system 01/10/2009  . UNS ADVRS EFF OTH RX MEDICINAL&BIOLOGICAL SBSTNC 01/10/2008  . LIVER FUNCTION TESTS, ABNORMAL, HX OF 12/02/2009   Past Surgical History  Procedure Laterality Date  . Hernia repair      bilateral inguinal   . Tonsillectomy      reports that he has never smoked. He does not have any smokeless tobacco history on file. His alcohol and drug histories are not on file. family history includes Diabetes in his mother and Hyperlipidemia in his mother. No Known Allergies    Review of Systems  Constitutional: Negative for fever, activity change, appetite change and fatigue.  HENT: Negative for ear pain, congestion and trouble swallowing.   Eyes: Negative for pain and visual disturbance.  Respiratory: Negative for cough, shortness of breath and wheezing.   Cardiovascular: Negative for chest pain and palpitations.  Gastrointestinal:  Negative for nausea, vomiting, abdominal pain, diarrhea, constipation, blood in stool, abdominal distention and rectal pain.  Genitourinary: Negative for dysuria, hematuria and testicular pain.  Musculoskeletal: Negative for joint swelling and arthralgias.  Skin: Negative for rash.  Neurological: Negative for dizziness, syncope and headaches.  Hematological: Negative for adenopathy.  Psychiatric/Behavioral: Negative for confusion and dysphoric mood.       Objective:   Physical Exam  Constitutional: He is oriented to person, place, and time. He appears well-developed and well-nourished. No distress.  HENT:  Head: Normocephalic and atraumatic.  Right Ear: External ear normal.  Left Ear: External ear normal.  Mouth/Throat: Oropharynx is clear and moist.  Eyes: Conjunctivae and EOM are normal. Pupils are equal, round, and reactive to light.  Neck: Normal range of motion. Neck supple. No thyromegaly present.  Cardiovascular: Normal rate, regular rhythm and normal heart sounds.  Exam reveals no gallop and no friction rub.   Pulmonary/Chest: No respiratory distress. He has no wheezes. He has no rales.  Abdominal: Soft. Bowel sounds are normal. He exhibits no distension and no mass. There is no tenderness. There is no rebound and no guarding.  Musculoskeletal: He exhibits no edema.  Lymphadenopathy:    He has no cervical adenopathy.  Neurological: He is alert and oriented to person, place, and time. He displays normal reflexes. No cranial nerve deficit.  Skin: No rash noted.  Psychiatric: He has a normal mood and affect.          Assessment & Plan:  #1 health maintenance. Immunizations up-to-date. Colonoscopy up to date. Labs reviewed with patient. PSA normal. Increase frequency of exercise #2 type 2 diabetes. Previously  managed with lifestyle control. A1c 9.4%. Start metformin 500 mg twice daily and increase exercise and weight loss. Reassess A1c 3 months #3 hypertension. Well  controlled. Refill medication for one year  #4 hyperlipidemia. LDL controlled. Has low HDL. Refill Lipitor for one year #5 erectile dysfunction. Refill Viagra for one year

## 2012-04-18 NOTE — Addendum Note (Signed)
Addended by: Melchor Amour on: 04/18/2012 09:58 AM   Modules accepted: Orders

## 2012-04-18 NOTE — Patient Instructions (Addendum)

## 2012-04-22 ENCOUNTER — Ambulatory Visit: Payer: Medicare Other | Admitting: Internal Medicine

## 2012-05-09 ENCOUNTER — Ambulatory Visit (INDEPENDENT_AMBULATORY_CARE_PROVIDER_SITE_OTHER): Payer: Medicare Other | Admitting: Family Medicine

## 2012-05-09 ENCOUNTER — Encounter: Payer: Self-pay | Admitting: Family Medicine

## 2012-05-09 VITALS — BP 120/80 | Temp 98.5°F

## 2012-05-09 DIAGNOSIS — R0982 Postnasal drip: Secondary | ICD-10-CM

## 2012-05-09 MED ORDER — HYDROCODONE-HOMATROPINE 5-1.5 MG/5ML PO SYRP: 5.0000 mL | ORAL_SOLUTION | Freq: Four times a day (QID) | ORAL | Status: DC | PRN

## 2012-05-09 MED ORDER — FLUTICASONE PROPIONATE 50 MCG/ACT NA SUSP: 2.0000 | Freq: Every day | NASAL | Status: DC

## 2012-05-09 NOTE — Progress Notes (Signed)
  Subjective:    Patient ID: Kevin Wise, male    DOB: May 21, 1935, 77 y.o.   MRN: [id number]  HPI Acute visit. Patient has persistent postnasal drip symptoms since January Started with what sounded initially like a viral URI He developed dry cough which is very minimal at this time. Over-the-counter Robitussin preparation without much improvement. No purulent secretions. Denies any headaches, fevers, or chills. Using hydrocodone cough syrup at night to suppress his cough  Does take ACE inhibitor with quinapril but again his cough has been minimal Generally has not had significant allergy issues previously. Denies recent GERD symptoms.  Past Medical History  Diagnosis Date  . HYPOTHYROIDISM 01/10/2008  . VITAMIN D DEFICIENCY 01/10/2008  . HYPERLIPIDEMIA 01/06/2007  . ANEMIA 01/10/2008  . ERECTILE DYSFUNCTION 01/06/2007  . HYPERTENSION, BENIGN ESSENTIAL 01/06/2007  . CAROTID ARTERY STENOSIS, RIGHT 01/10/2009  . Rosacea 01/06/2007  . ARTHRITIS 01/06/2007  . HYPERGLYCEMIA, BORDERLINE 12/02/2009  . Other symptoms involving cardiovascular system 01/10/2009  . UNS ADVRS EFF OTH RX MEDICINAL&BIOLOGICAL SBSTNC 01/10/2008  . LIVER FUNCTION TESTS, ABNORMAL, HX OF 12/02/2009   Past Surgical History  Procedure Laterality Date  . Hernia repair      bilateral inguinal   . Tonsillectomy      reports that he has never smoked. He does not have any smokeless tobacco history on file. His alcohol and drug histories are not on file. family history includes Diabetes in his mother and Hyperlipidemia in his mother. No Known Allergies    Review of Systems  Constitutional: Negative for fever and chills.  HENT: Positive for congestion and postnasal drip. Negative for sore throat, voice change and sinus pressure.   Respiratory: Positive for cough. Negative for shortness of breath and wheezing.   Cardiovascular: Negative for chest pain.       Objective:   Physical Exam  Constitutional: He  appears well-developed and well-nourished.  HENT:  Right Ear: External ear normal.  Left Ear: External ear normal.  Nose: Nose normal.  Mouth/Throat: Oropharynx is clear and moist.  Neck: Neck supple.  Cardiovascular: Normal rate and regular rhythm.   Pulmonary/Chest: Effort normal and breath sounds normal. No respiratory distress. He has no wheezes. He has no rales.  Lymphadenopathy:    He has no cervical adenopathy.          Assessment & Plan:  Persistent postnasal drip symptoms. Question allergic. Try over-the-counter Allegra or Zyrtec. Flonase nasal 2 sprays per nostril once daily. Touch base one week if no improvement. We discussed possible discontinuation of ACE inhibitor but at this point his cough is really minimal and seems to be secondary to postnasal drip symptoms

## 2012-05-09 NOTE — Patient Instructions (Signed)
Try over the counter Allegra or Zyrtec one daily for nasal drip symptoms. Let me know in one week if no better.

## 2012-07-04 ENCOUNTER — Telehealth: Payer: Self-pay | Admitting: Family Medicine

## 2012-07-04 MED ORDER — GLUCOSE BLOOD VI STRP: ORAL_STRIP | Status: DC

## 2012-07-04 MED ORDER — BAYER MICROLET LANCETS MISC: Status: DC

## 2012-07-04 NOTE — Telephone Encounter (Signed)
Rx printed as requested.

## 2012-07-04 NOTE — Telephone Encounter (Signed)
Pt would like if you would run a paper script for his BAYER MICROLET LANCETS lancets and glucose blood test strip. [facility] will not fill due to his type of insurance.  Pt has been using samples until now. Pt never got this RX filled, that is why they will not transfer to another pharm. Pt would like to pick up RX for both at 12:30pm TODAY if possible.  If not possible, please call pt on cell phone.

## 2012-07-19 ENCOUNTER — Other Ambulatory Visit (INDEPENDENT_AMBULATORY_CARE_PROVIDER_SITE_OTHER): Payer: Medicare Other

## 2012-07-19 DIAGNOSIS — IMO0001 Reserved for inherently not codable concepts without codable children: Secondary | ICD-10-CM

## 2012-07-21 NOTE — Progress Notes (Signed)
Quick Note:  Pt has an appt in one week. ______

## 2012-07-25 ENCOUNTER — Ambulatory Visit: Payer: Medicare Other | Admitting: Family Medicine

## 2012-07-27 ENCOUNTER — Telehealth: Payer: Self-pay | Admitting: Family Medicine

## 2012-07-27 NOTE — Telephone Encounter (Signed)
Pt would like blood work results and to know if he can cancel his appt sch for 08-08-12. Please advice.

## 2012-07-28 NOTE — Telephone Encounter (Signed)
Spoke to pt told him Hemoglobin A1c is 7.3 still elevated will discuss at follow up appt on 6/16. Pt verbalized understanding.

## 2012-08-08 ENCOUNTER — Encounter: Payer: Self-pay | Admitting: Family Medicine

## 2012-08-08 ENCOUNTER — Ambulatory Visit (INDEPENDENT_AMBULATORY_CARE_PROVIDER_SITE_OTHER): Payer: Medicare Other | Admitting: Family Medicine

## 2012-08-08 VITALS — BP 120/84 | Temp 98.1°F | Wt 182.0 lb

## 2012-08-08 DIAGNOSIS — E119 Type 2 diabetes mellitus without complications: Secondary | ICD-10-CM

## 2012-08-08 LAB — HM DIABETES FOOT EXAM: HM Diabetic Foot Exam: NORMAL

## 2012-08-08 MED ORDER — METFORMIN HCL 850 MG PO TABS: 850.0000 mg | ORAL_TABLET | Freq: Two times a day (BID) | ORAL | Status: DC

## 2012-08-08 NOTE — Progress Notes (Signed)
  Subjective:    Patient ID: Kevin Wise, male    DOB: October 04, 1935, 77 y.o.   MRN: [id number]  HPI Patient seen for routine followup. Type 2 diabetes. Recent A1c 9.4%. Since that time he has done an excellent job with weight loss and we also increased metformin Recent A1c 7.3% .  Overall feels well. He had to lose some additional weight No symptoms of hyperglycemia. No chest pains.  Blood pressures been well controlled  Past Medical History  Diagnosis Date  . HYPOTHYROIDISM 01/10/2008  . VITAMIN D DEFICIENCY 01/10/2008  . HYPERLIPIDEMIA 01/06/2007  . ANEMIA 01/10/2008  . ERECTILE DYSFUNCTION 01/06/2007  . HYPERTENSION, BENIGN ESSENTIAL 01/06/2007  . CAROTID ARTERY STENOSIS, RIGHT 01/10/2009  . Rosacea 01/06/2007  . ARTHRITIS 01/06/2007  . HYPERGLYCEMIA, BORDERLINE 12/02/2009  . Other symptoms involving cardiovascular system 01/10/2009  . UNS ADVRS EFF OTH RX MEDICINAL&BIOLOGICAL SBSTNC 01/10/2008  . LIVER FUNCTION TESTS, ABNORMAL, HX OF 12/02/2009   Past Surgical History  Procedure Laterality Date  . Hernia repair      bilateral inguinal   . Tonsillectomy      reports that he has never smoked. He does not have any smokeless tobacco history on file. His alcohol and drug histories are not on file. family history includes Diabetes in his mother and Hyperlipidemia in his mother. No Known Allergies    Review of Systems  Constitutional: Negative for fatigue.  Eyes: Negative for visual disturbance.  Respiratory: Negative for cough, chest tightness and shortness of breath.   Cardiovascular: Negative for chest pain, palpitations and leg swelling.  Neurological: Negative for dizziness, syncope, weakness, light-headedness and headaches.       Objective:   Physical Exam  Constitutional: He appears well-developed and well-nourished.  Cardiovascular: Normal rate and regular rhythm.   Pulmonary/Chest: Effort normal and breath sounds normal. No respiratory distress. He has no  wheezes. He has no rales.  Musculoskeletal: He exhibits no edema.  Skin:  Feet reveal no skin lesions. Good distal foot pulses. Good capillary refill. He has a couple small calluses, one each foot laterally but no ulceration. Normal sensation with monofilament testing           Assessment & Plan:  Type 2 diabetes. Improving. Titrate metformin 850 mg twice daily. Continue weight loss efforts. Reassess A1c 3 months

## 2012-10-31 ENCOUNTER — Other Ambulatory Visit (INDEPENDENT_AMBULATORY_CARE_PROVIDER_SITE_OTHER): Payer: Medicare Other

## 2012-10-31 ENCOUNTER — Telehealth: Payer: Self-pay

## 2012-10-31 DIAGNOSIS — Z23 Encounter for immunization: Secondary | ICD-10-CM

## 2012-10-31 DIAGNOSIS — E119 Type 2 diabetes mellitus without complications: Secondary | ICD-10-CM

## 2012-10-31 LAB — HEMOGLOBIN A1C: Hgb A1c MFr Bld: 6.7 % — ABNORMAL HIGH (ref 4.6–6.5)

## 2012-10-31 NOTE — Telephone Encounter (Signed)
What about his appointment? Do you still want him to keep his appointment since his A1C is at goal

## 2012-10-31 NOTE — Telephone Encounter (Signed)
Pt wants to know do you still want him to keep on taking his metformin, pt stated that the medication is making him lose weight. Pt did also state that he working out to lose weight. But he wants to know do you want to cut the medication in half or just stop taking the medication because of his weight loss.

## 2012-10-31 NOTE — Addendum Note (Signed)
Addended by: Kern Reap B on: 10/31/2012 09:20 AM   Modules accepted: Orders

## 2012-10-31 NOTE — Telephone Encounter (Signed)
Pt also wants to know do you still want him to keep his appointment on 9/15.

## 2012-10-31 NOTE — Telephone Encounter (Signed)
Pt aware.

## 2012-10-31 NOTE — Telephone Encounter (Signed)
OK to reduce to once daily but I would not advise quitting altogether.

## 2012-10-31 NOTE — Telephone Encounter (Signed)
Make sure he has appt within 3 months.

## 2012-11-07 ENCOUNTER — Ambulatory Visit: Payer: Medicare Other | Admitting: Family Medicine

## 2013-01-23 ENCOUNTER — Other Ambulatory Visit (INDEPENDENT_AMBULATORY_CARE_PROVIDER_SITE_OTHER): Payer: Medicare Other

## 2013-01-23 DIAGNOSIS — R7309 Other abnormal glucose: Secondary | ICD-10-CM

## 2013-01-23 LAB — HEMOGLOBIN A1C: Hgb A1c MFr Bld: 7.2 % — ABNORMAL HIGH (ref 4.6–6.5)

## 2013-01-31 LAB — HM DIABETES EYE EXAM

## 2013-02-01 ENCOUNTER — Ambulatory Visit (INDEPENDENT_AMBULATORY_CARE_PROVIDER_SITE_OTHER): Payer: Medicare Other | Admitting: Family Medicine

## 2013-02-01 ENCOUNTER — Encounter: Payer: Self-pay | Admitting: Family Medicine

## 2013-02-01 VITALS — BP 132/78 | HR 84 | Temp 98.5°F | Wt 179.0 lb

## 2013-02-01 DIAGNOSIS — I10 Essential (primary) hypertension: Secondary | ICD-10-CM

## 2013-02-01 DIAGNOSIS — E119 Type 2 diabetes mellitus without complications: Secondary | ICD-10-CM

## 2013-02-01 NOTE — Progress Notes (Signed)
Pre visit review using our clinic review tool, if applicable. No additional management support is needed unless otherwise documented below in the visit note. 

## 2013-02-01 NOTE — Patient Instructions (Signed)
Continue with weight loss and exercise efforts.

## 2013-02-01 NOTE — Progress Notes (Signed)
   Subjective:    Patient ID: Kevin Wise, male    DOB: 05/23/1935, 77 y.o.   MRN: [id number]  HPI Patient seen for medical follow up History of type 2 diabetes with recent A1c 7.2%. He had reduced his metformin to 850 mg once daily and is now gone back up to twice daily. He does not have any side effects with twice a day. He has lost some weight due to his efforts since last visit and overall feels well. He is exercising most days of the week. Blood pressures have remained well controlled on quinapril HCTZ. Fasting blood sugars consistently around 130 or less and two-hour postprandial blood sugars less than 180. Patient had flu vaccine. Eye exam just yesterday normal with exception of small right subconjunctival hemorrhage  Past Medical History  Diagnosis Date  . HYPOTHYROIDISM 01/10/2008  . VITAMIN D DEFICIENCY 01/10/2008  . HYPERLIPIDEMIA 01/06/2007  . ANEMIA 01/10/2008  . ERECTILE DYSFUNCTION 01/06/2007  . HYPERTENSION, BENIGN ESSENTIAL 01/06/2007  . CAROTID ARTERY STENOSIS, RIGHT 01/10/2009  . Rosacea 01/06/2007  . ARTHRITIS 01/06/2007  . HYPERGLYCEMIA, BORDERLINE 12/02/2009  . Other symptoms involving cardiovascular system 01/10/2009  . UNS ADVRS EFF OTH RX MEDICINAL&BIOLOGICAL SBSTNC 01/10/2008  . LIVER FUNCTION TESTS, ABNORMAL, HX OF 12/02/2009   Past Surgical History  Procedure Laterality Date  . Hernia repair      bilateral inguinal   . Tonsillectomy      reports that he has never smoked. He does not have any smokeless tobacco history on file. His alcohol and drug histories are not on file. family history includes Diabetes in his mother; Hyperlipidemia in his mother. No Known Allergies    Review of Systems  Constitutional: Negative for fatigue and unexpected weight change.  Eyes: Negative for visual disturbance.  Respiratory: Negative for cough, chest tightness and shortness of breath.   Cardiovascular: Negative for chest pain, palpitations and leg swelling.    Neurological: Negative for dizziness, syncope, weakness, light-headedness and headaches.       Objective:   Physical Exam  Constitutional: He appears well-developed and well-nourished.  Neck: Neck supple. No thyromegaly present.  Cardiovascular: Normal rate and regular rhythm.   Pulmonary/Chest: Effort normal and breath sounds normal. No respiratory distress. He has no wheezes. He has no rales.  Musculoskeletal: He exhibits no edema.          Assessment & Plan:  #1 type 2 diabetes. Fair control. He just recently increased his metformin back to 850 mg twice a day. We'll repeat A1c at followup for physical in February. Continue exercise and weight control efforts #2 hypertension adequate control. Continue Accuretic

## 2013-04-17 ENCOUNTER — Encounter: Payer: Self-pay | Admitting: Family Medicine

## 2013-04-17 ENCOUNTER — Ambulatory Visit (INDEPENDENT_AMBULATORY_CARE_PROVIDER_SITE_OTHER): Payer: Medicare Other | Admitting: Family Medicine

## 2013-04-17 VITALS — BP 130/74 | HR 80 | Wt 182.0 lb

## 2013-04-17 DIAGNOSIS — M545 Low back pain, unspecified: Secondary | ICD-10-CM

## 2013-04-17 MED ORDER — PREDNISONE 10 MG PO TABS: ORAL_TABLET | ORAL | Status: DC

## 2013-04-17 MED ORDER — HYDROCODONE-ACETAMINOPHEN 5-325 MG PO TABS: 1.0000 | ORAL_TABLET | Freq: Four times a day (QID) | ORAL | Status: DC | PRN

## 2013-04-17 NOTE — Patient Instructions (Signed)
Lumbosacral Strain Lumbosacral strain is a strain of any of the parts that make up your lumbosacral vertebrae. Your lumbosacral vertebrae are the bones that make up the lower third of your backbone. Your lumbosacral vertebrae are held together by muscles and tough, fibrous tissue (ligaments).  CAUSES  A sudden blow to your back can cause lumbosacral strain. Also, anything that causes an excessive stretch of the muscles in the low back can cause this strain. This is typically seen when people exert themselves strenuously, fall, lift heavy objects, bend, or crouch repeatedly. RISK FACTORS  Physically demanding work.  Participation in pushing or pulling sports or sports that require sudden twist of the back (tennis, golf, baseball).  Weight lifting.  Excessive lower back curvature.  Forward-tilted pelvis.  Weak back or abdominal muscles or both.  Tight hamstrings. SIGNS AND SYMPTOMS  Lumbosacral strain may cause pain in the area of your injury or pain that moves (radiates) down your leg.  DIAGNOSIS Your health care provider can often diagnose lumbosacral strain through a physical exam. In some cases, you may need tests such as X-ray exams.  TREATMENT  Treatment for your lower back injury depends on many factors that your clinician will have to evaluate. However, most treatment will include the use of anti-inflammatory medicines. HOME CARE INSTRUCTIONS   Avoid hard physical activities (tennis, racquetball, waterskiing) if you are not in proper physical condition for it. This may aggravate or create problems.  If you have a back problem, avoid sports requiring sudden body movements. Swimming and walking are generally safer activities.  Maintain good posture.  Maintain a healthy weight.  For acute conditions, you may put ice on the injured area.  Put ice in a plastic bag.  Place a towel between your skin and the bag.  Leave the ice on for 20 minutes, 2 3 times a day.  When the  low back starts healing, stretching and strengthening exercises may be recommended. SEEK MEDICAL CARE IF:  Your back pain is getting worse.  You experience severe back pain not relieved with medicines. SEEK IMMEDIATE MEDICAL CARE IF:   You have numbness, tingling, weakness, or problems with the use of your arms or legs.  There is a change in bowel or bladder control.  You have increasing pain in any area of the body, including your belly (abdomen).  You notice shortness of breath, dizziness, or feel faint.  You feel sick to your stomach (nauseous), are throwing up (vomiting), or become sweaty.  You notice discoloration of your toes or legs, or your feet get very cold. MAKE SURE YOU:   Understand these instructions.  Will watch your condition.  Will get help right away if you are not doing well or get worse. Document Released: 11/19/2004 Document Revised: 11/30/2012 Document Reviewed: 09/28/2012 [employer] Patient Information 2014 [employer].  

## 2013-04-17 NOTE — Progress Notes (Signed)
Pre visit review using our clinic review tool, if applicable. No additional management support is needed unless otherwise documented below in the visit note. 

## 2013-04-17 NOTE — Progress Notes (Signed)
   Subjective:    Patient ID: Kevin Wise, male    DOB: 03-19-1935, 78 y.o.   MRN: [id number]  Back Pain Pertinent negatives include no abdominal pain, chest pain, dysuria, fever, numbness or weakness.   Acute visit for low back pain. Patient had onset Saturday after doing some shoveling of snow. No specific injury. Location is right lower lumbar. Pain is also known. Moderate severity. He's taken Aleve and Advil without relief. He has occasional sharp fleeting right radiculopathy symptoms. No weakness. No numbness. No urine or stool incontinence. He had difficulty sleeping secondary pain at times. No urinary symptoms. No fevers or chills. No alleviating factors. Exacerbated by change of position.  Past Medical History  Diagnosis Date  . HYPOTHYROIDISM 01/10/2008  . VITAMIN D DEFICIENCY 01/10/2008  . HYPERLIPIDEMIA 01/06/2007  . ANEMIA 01/10/2008  . ERECTILE DYSFUNCTION 01/06/2007  . HYPERTENSION, BENIGN ESSENTIAL 01/06/2007  . CAROTID ARTERY STENOSIS, RIGHT 01/10/2009  . Rosacea 01/06/2007  . ARTHRITIS 01/06/2007  . HYPERGLYCEMIA, BORDERLINE 12/02/2009  . Other symptoms involving cardiovascular system 01/10/2009  . UNS ADVRS EFF OTH RX MEDICINAL&BIOLOGICAL SBSTNC 01/10/2008  . LIVER FUNCTION TESTS, ABNORMAL, HX OF 12/02/2009   Past Surgical History  Procedure Laterality Date  . Hernia repair      bilateral inguinal   . Tonsillectomy      reports that he has never smoked. He does not have any smokeless tobacco history on file. His alcohol and drug histories are not on file. family history includes Diabetes in his mother; Hyperlipidemia in his mother. No Known Allergies    Review of Systems  Constitutional: Negative for fever, activity change and appetite change.  Respiratory: Negative for cough and shortness of breath.   Cardiovascular: Negative for chest pain and leg swelling.  Gastrointestinal: Negative for vomiting and abdominal pain.  Genitourinary: Negative for  dysuria, hematuria and flank pain.  Musculoskeletal: Positive for back pain. Negative for joint swelling.  Neurological: Negative for weakness and numbness.       Objective:   Physical Exam  Constitutional: He appears well-developed and well-nourished.  Cardiovascular: Normal rate.   Pulmonary/Chest: Effort normal and breath sounds normal. No respiratory distress. He has no wheezes. He has no rales.  Musculoskeletal: He exhibits no edema.  Straight leg raise are negative  Neurological:  Full-strength lower extremities. Symmetric reflexes.          Assessment & Plan:  Acute right lower lumbar back pain with intermittent right radiculopathy symptoms with nonfocal neurologic exam. Hold further Advil and Aleve. Prednisone taper and monitor blood sugars closely. Hydrocodone 5 mg every 6 hours as needed for severe pain. Try heat or ice. Reassess next week

## 2013-04-18 ENCOUNTER — Other Ambulatory Visit: Payer: Medicare Other

## 2013-04-20 ENCOUNTER — Other Ambulatory Visit: Payer: Medicare Other

## 2013-04-24 ENCOUNTER — Encounter: Payer: Medicare Other | Admitting: Family Medicine

## 2013-04-26 ENCOUNTER — Encounter: Payer: Self-pay | Admitting: *Deleted

## 2013-04-27 ENCOUNTER — Other Ambulatory Visit (INDEPENDENT_AMBULATORY_CARE_PROVIDER_SITE_OTHER): Payer: Medicare Other

## 2013-04-27 DIAGNOSIS — E119 Type 2 diabetes mellitus without complications: Secondary | ICD-10-CM

## 2013-04-27 DIAGNOSIS — Z125 Encounter for screening for malignant neoplasm of prostate: Secondary | ICD-10-CM

## 2013-04-27 DIAGNOSIS — Z0000 Encounter for general adult medical examination without abnormal findings: Secondary | ICD-10-CM

## 2013-04-27 LAB — HEPATIC FUNCTION PANEL
ALK PHOS: 48 U/L (ref 39–117)
ALT: 32 U/L (ref 0–53)
AST: 22 U/L (ref 0–37)
Albumin: 4 g/dL (ref 3.5–5.2)
BILIRUBIN DIRECT: 0.2 mg/dL (ref 0.0–0.3)
BILIRUBIN TOTAL: 1 mg/dL (ref 0.3–1.2)
Total Protein: 6.9 g/dL (ref 6.0–8.3)

## 2013-04-27 LAB — BASIC METABOLIC PANEL
BUN: 20 mg/dL (ref 6–23)
CALCIUM: 9.3 mg/dL (ref 8.4–10.5)
CO2: 27 mEq/L (ref 19–32)
Chloride: 99 mEq/L (ref 96–112)
Creatinine, Ser: 1.2 mg/dL (ref 0.4–1.5)
GFR: 78.87 mL/min (ref >60.00)
GLUCOSE: 88 mg/dL (ref 70–99)
Potassium: 4.1 mEq/L (ref 3.5–5.1)
Sodium: 136 mEq/L (ref 135–145)

## 2013-04-27 LAB — POCT URINALYSIS DIPSTICK
BILIRUBIN UA: NEGATIVE
Blood, UA: NEGATIVE
Glucose, UA: NEGATIVE
KETONES UA: NEGATIVE
Leukocytes, UA: NEGATIVE
Nitrite, UA: NEGATIVE
SPEC GRAV UA: 1.02
Urobilinogen, UA: 0.2
pH, UA: 6

## 2013-04-27 LAB — CBC WITH DIFFERENTIAL/PLATELET
BASOS ABS: 0 10*3/uL (ref 0.0–0.1)
Basophils Relative: 0.3 % (ref 0.0–3.0)
Eosinophils Absolute: 0.1 10*3/uL (ref 0.0–0.7)
Eosinophils Relative: 1.4 % (ref 0.0–5.0)
HEMATOCRIT: 41.7 % (ref 39.0–52.0)
Hemoglobin: 13.6 g/dL (ref 13.0–17.0)
LYMPHS ABS: 3.4 10*3/uL (ref 0.7–4.0)
LYMPHS PCT: 36 % (ref 12.0–46.0)
MCHC: 32.7 g/dL (ref 30.0–36.0)
MCV: 92.4 fl (ref 78.0–100.0)
Monocytes Absolute: 0.7 10*3/uL (ref 0.1–1.0)
Monocytes Relative: 7.7 % (ref 3.0–12.0)
Neutro Abs: 5.1 10*3/uL (ref 1.4–7.7)
Neutrophils Relative %: 54.6 % (ref 43.0–77.0)
PLATELETS: 290 10*3/uL (ref 150.0–400.0)
RBC: 4.51 Mil/uL (ref 4.22–5.81)
RDW: 13.2 % (ref 11.5–14.6)
WBC: 9.4 10*3/uL (ref 4.5–10.5)

## 2013-04-27 LAB — LIPID PANEL
CHOLESTEROL: 131 mg/dL (ref 0–200)
HDL: 40.5 mg/dL (ref >39.00)
LDL Cholesterol: 67 mg/dL (ref 0–99)
Total CHOL/HDL Ratio: 3
Triglycerides: 117 mg/dL (ref 0.0–149.0)
VLDL: 23.4 mg/dL (ref 0.0–40.0)

## 2013-04-27 LAB — HEMOGLOBIN A1C: Hgb A1c MFr Bld: 7.3 % — ABNORMAL HIGH (ref 4.6–6.5)

## 2013-04-27 LAB — MICROALBUMIN / CREATININE URINE RATIO
Creatinine,U: 153.7 mg/dL
Microalb Creat Ratio: 4.1 mg/g (ref 0.0–30.0)
Microalb, Ur: 6.3 mg/dL — ABNORMAL HIGH (ref 0.0–1.9)

## 2013-04-27 LAB — TSH: TSH: 0.69 u[IU]/mL (ref 0.35–5.50)

## 2013-04-27 LAB — PSA: PSA: 1.22 ng/mL (ref 0.10–4.00)

## 2013-05-01 ENCOUNTER — Ambulatory Visit (INDEPENDENT_AMBULATORY_CARE_PROVIDER_SITE_OTHER): Payer: Medicare Other | Admitting: Family Medicine

## 2013-05-01 ENCOUNTER — Encounter: Payer: Self-pay | Admitting: Family Medicine

## 2013-05-01 VITALS — BP 124/72 | HR 99 | Temp 97.5°F | Ht 68.0 in | Wt 180.0 lb

## 2013-05-01 DIAGNOSIS — Z0000 Encounter for general adult medical examination without abnormal findings: Secondary | ICD-10-CM

## 2013-05-01 DIAGNOSIS — Z23 Encounter for immunization: Secondary | ICD-10-CM

## 2013-05-01 DIAGNOSIS — I10 Essential (primary) hypertension: Secondary | ICD-10-CM

## 2013-05-01 DIAGNOSIS — IMO0002 Reserved for concepts with insufficient information to code with codable children: Secondary | ICD-10-CM

## 2013-05-01 DIAGNOSIS — E785 Hyperlipidemia, unspecified: Secondary | ICD-10-CM

## 2013-05-01 DIAGNOSIS — M5416 Radiculopathy, lumbar region: Secondary | ICD-10-CM

## 2013-05-01 MED ORDER — FLUTICASONE PROPIONATE 50 MCG/ACT NA SUSP: 2.0000 | Freq: Every day | NASAL | Status: DC

## 2013-05-01 MED ORDER — METFORMIN HCL 850 MG PO TABS: 850.0000 mg | ORAL_TABLET | Freq: Two times a day (BID) | ORAL | Status: DC

## 2013-05-01 MED ORDER — GABAPENTIN 300 MG PO CAPS: 300.0000 mg | ORAL_CAPSULE | Freq: Two times a day (BID) | ORAL | Status: DC

## 2013-05-01 MED ORDER — QUINAPRIL-HYDROCHLOROTHIAZIDE 20-12.5 MG PO TABS: 1.0000 | ORAL_TABLET | Freq: Two times a day (BID) | ORAL | Status: DC

## 2013-05-01 MED ORDER — ATORVASTATIN CALCIUM 40 MG PO TABS: 40.0000 mg | ORAL_TABLET | Freq: Every day | ORAL | Status: DC

## 2013-05-01 NOTE — Patient Instructions (Signed)

## 2013-05-01 NOTE — Progress Notes (Signed)
Pre visit review using our clinic review tool, if applicable. No additional management support is needed unless otherwise documented below in the visit note. 

## 2013-05-01 NOTE — Progress Notes (Signed)
Subjective:    Patient ID: Kevin Wise, male    DOB: June 20, 1935, 78 y.o.   MRN: [id number]  HPI Patient seen for complete physical and medical followup. His chronic problems are type 2 diabetes, hypertension, hyperlipidemia, osteoarthritis. He had recent problems of right lower extremity pain with right lumbar back pain. Started in February after shoveling snow. He has some numbness which is fairly persistent right anterior thigh and his pain is mostly right lumbar area toward the knee. Eight out 10 severity at times. Dull achy pain. Tried prednisone and hydrocodone with minimal relief. No loss of bladder or bowel control. Pain is at rest. No significant difficulties with ambulation. No definite weakness. No appetite or weight changes.  Chronic medical problems as above. Medications reviewed. Compliant with all. Needs refills of all medications today.  Past Medical History  Diagnosis Date  . HYPOTHYROIDISM 01/10/2008  . VITAMIN D DEFICIENCY 01/10/2008  . HYPERLIPIDEMIA 01/06/2007  . ANEMIA 01/10/2008  . ERECTILE DYSFUNCTION 01/06/2007  . HYPERTENSION, BENIGN ESSENTIAL 01/06/2007  . CAROTID ARTERY STENOSIS, RIGHT 01/10/2009  . Rosacea 01/06/2007  . ARTHRITIS 01/06/2007  . HYPERGLYCEMIA, BORDERLINE 12/02/2009  . Other symptoms involving cardiovascular system 01/10/2009  . UNS ADVRS EFF OTH RX MEDICINAL&BIOLOGICAL SBSTNC 01/10/2008  . LIVER FUNCTION TESTS, ABNORMAL, HX OF 12/02/2009   Past Surgical History  Procedure Laterality Date  . Hernia repair      bilateral inguinal   . Tonsillectomy      reports that he has never smoked. He does not have any smokeless tobacco history on file. His alcohol and drug histories are not on file. family history includes Diabetes in his mother; Hyperlipidemia in his mother. No Known Allergies  1.  Risk factors based on Past Medical , Social, and Family history reviewed as above 2.  Limitations in physical activities very active. No recent  falls 3.  Depression/mood mood stable 4.  Hearing no recent changes 5.  ADLs independent in all 6.  Cognitive function (orientation to time and place, language, writing, speech,memory) memory intact. Language and judgment intact 7.  Home Safety no issues 8.  Height, weight, and visual acuity. All stable 9.  Counseling 10. Recommendation of preventive services. Prevnar 13 11. Labs based on risk factors recent labs obtained and reviewed 12. Care Plan as above    Review of Systems  Constitutional: Negative for fever, activity change, appetite change and fatigue.  HENT: Negative for congestion, ear pain and trouble swallowing.   Eyes: Negative for pain and visual disturbance.  Respiratory: Negative for cough, shortness of breath and wheezing.   Cardiovascular: Negative for chest pain and palpitations.  Gastrointestinal: Negative for nausea, vomiting, abdominal pain, diarrhea, constipation, blood in stool, abdominal distention and rectal pain.  Endocrine: Negative for polydipsia and polyuria.  Genitourinary: Negative for dysuria, hematuria and testicular pain.  Musculoskeletal: Positive for back pain. Negative for arthralgias and joint swelling.  Skin: Negative for rash.  Neurological: Positive for numbness. Negative for dizziness, syncope and headaches.  Hematological: Negative for adenopathy.  Psychiatric/Behavioral: Negative for confusion and dysphoric mood.       Objective:   Physical Exam  Constitutional: He is oriented to person, place, and time. He appears well-developed and well-nourished. No distress.  HENT:  Head: Normocephalic and atraumatic.  Right Ear: External ear normal.  Left Ear: External ear normal.  Mouth/Throat: Oropharynx is clear and moist.  Eyes: Conjunctivae and EOM are normal. Pupils are equal, round, and reactive to light.  Neck: Normal range  of motion. Neck supple. No thyromegaly present.  Cardiovascular: Normal rate, regular rhythm and normal heart  sounds.   No murmur heard. Pulmonary/Chest: No respiratory distress. He has no wheezes. He has no rales.  Abdominal: Soft. Bowel sounds are normal. He exhibits no distension and no mass. There is no tenderness. There is no rebound and no guarding.  Musculoskeletal: He exhibits no edema.  Straight leg raise are negative.  Lymphadenopathy:    He has no cervical adenopathy.  Neurological: He is alert and oriented to person, place, and time. He displays normal reflexes. No cranial nerve deficit.  Full-strength lower extremities with symmetric reflexes throughout  Skin: No rash noted.  Psychiatric: He has a normal mood and affect.          Assessment & Plan:  #1 health maintenance. Prevnar 13 given. Labs reviewed with no major concerns #2 right lumbar back pain with radiculopathy symptoms. Try gabapentin 300 mg each bedtime and after 3 nights try titrating to 300 mg twice a day. If no better in 2-3 weeks May need MRI scan to further assess #3 type 2 diabetes. History fair control. Recent A1c 7.3%. Continue current regimen #4 hypertension adequately controlled #5 dyslipidemia with stable numbers by recent labs

## 2013-05-02 ENCOUNTER — Telehealth: Payer: Self-pay | Admitting: Family Medicine

## 2013-05-02 NOTE — Telephone Encounter (Signed)
Relevant patient education mailed to patient.  

## 2013-08-07 ENCOUNTER — Other Ambulatory Visit: Payer: Self-pay | Admitting: Family Medicine

## 2013-08-07 ENCOUNTER — Telehealth: Payer: Self-pay | Admitting: Family Medicine

## 2013-08-07 MED ORDER — GLUCOSE BLOOD VI STRP: ORAL_STRIP | Status: DC

## 2013-08-07 NOTE — Telephone Encounter (Signed)
[facility] calling in regards to pt's rx BAYER CONTOUR NEXT TEST test strip and lancets, need dx code before filling.

## 2013-08-07 NOTE — Telephone Encounter (Signed)
RX sent to pharmacy  

## 2013-08-14 ENCOUNTER — Telehealth: Payer: Self-pay | Admitting: Family Medicine

## 2013-08-14 MED ORDER — GLUCOSE BLOOD VI STRP: ORAL_STRIP | Status: DC

## 2013-08-14 MED ORDER — ONETOUCH LANCETS MISC: Status: DC

## 2013-08-14 NOTE — Telephone Encounter (Signed)
OK to change but make sure he understands this is an insurance issue.

## 2013-08-14 NOTE — Telephone Encounter (Signed)
I received a PA request for Bayer Contour Next Test but the PREFERRED products are ACCU-CHEK and OneTouch.  Please advise if the RX can be changed or if I need to submit PA.

## 2013-08-14 NOTE — Telephone Encounter (Signed)
Pt is aware of the changed with meters. Pt aware that meter is at the front.

## 2013-08-28 ENCOUNTER — Ambulatory Visit (INDEPENDENT_AMBULATORY_CARE_PROVIDER_SITE_OTHER): Payer: Medicare Other | Admitting: Family Medicine

## 2013-08-28 ENCOUNTER — Encounter: Payer: Self-pay | Admitting: Family Medicine

## 2013-08-28 VITALS — BP 128/80 | HR 63 | Temp 98.5°F | Wt 178.0 lb

## 2013-08-28 DIAGNOSIS — M79609 Pain in unspecified limb: Secondary | ICD-10-CM

## 2013-08-28 DIAGNOSIS — E119 Type 2 diabetes mellitus without complications: Secondary | ICD-10-CM | POA: Insufficient documentation

## 2013-08-28 DIAGNOSIS — M79604 Pain in right leg: Secondary | ICD-10-CM

## 2013-08-28 DIAGNOSIS — R109 Unspecified abdominal pain: Secondary | ICD-10-CM

## 2013-08-28 LAB — POCT URINALYSIS DIPSTICK
Bilirubin, UA: NEGATIVE
Blood, UA: NEGATIVE
Glucose, UA: NEGATIVE
Ketones, UA: NEGATIVE
Leukocytes, UA: NEGATIVE
Nitrite, UA: NEGATIVE
PROTEIN UA: NEGATIVE
SPEC GRAV UA: 1.015
Urobilinogen, UA: 0.2
pH, UA: 7

## 2013-08-28 LAB — HEMOGLOBIN A1C: Hgb A1c MFr Bld: 6.8 % — ABNORMAL HIGH (ref 4.6–6.5)

## 2013-08-28 NOTE — Addendum Note (Signed)
Addended by: Thomasena Edis on: 08/28/2013 09:22 AM   Modules accepted: Orders

## 2013-08-28 NOTE — Progress Notes (Signed)
   Subjective:    Patient ID: Kevin Wise, male    DOB: May 30, 1935, 78 y.o.   MRN: [id number]  Flank Pain Pertinent negatives include no chest pain, fever, numbness or weakness.   Patient seen with one month (possibly longer) left flank pain. Pain is more constant. Describes achy quality. 4-5/10 intensity. No exacerbating factors. He's tried Advil, Aleve, and Tylenol without relief. Denies any associated dysuria, fever, chills, appetite or weight changes, or change in bowel habits. He reports he had colonoscopy about 2 years ago. No recent hematuria. No history of similar pain. Denies prior history of kidney stones.  Fleeting sharp right foot pain. He has pain that radiates from the knee down to the foot occasionally. Had some previous back pains but none currently. No weakness. No numbness. No clear exacerbating factors.  Past Medical History  Diagnosis Date  . HYPOTHYROIDISM 01/10/2008  . VITAMIN D DEFICIENCY 01/10/2008  . HYPERLIPIDEMIA 01/06/2007  . ANEMIA 01/10/2008  . ERECTILE DYSFUNCTION 01/06/2007  . HYPERTENSION, BENIGN ESSENTIAL 01/06/2007  . CAROTID ARTERY STENOSIS, RIGHT 01/10/2009  . Rosacea 01/06/2007  . ARTHRITIS 01/06/2007  . HYPERGLYCEMIA, BORDERLINE 12/02/2009  . Other symptoms involving cardiovascular system 01/10/2009  . UNS ADVRS EFF OTH RX MEDICINAL&BIOLOGICAL SBSTNC 01/10/2008  . LIVER FUNCTION TESTS, ABNORMAL, HX OF 12/02/2009   Past Surgical History  Procedure Laterality Date  . Hernia repair      bilateral inguinal   . Tonsillectomy      reports that he has never smoked. He does not have any smokeless tobacco history on file. His alcohol and drug histories are not on file. family history includes Diabetes in his mother; Hyperlipidemia in his mother. No Known Allergies    Review of Systems  Constitutional: Negative for fever, chills, appetite change, fatigue and unexpected weight change.  Respiratory: Negative for cough and shortness of breath.     Cardiovascular: Negative for chest pain.  Gastrointestinal: Negative for nausea, vomiting, diarrhea, constipation, blood in stool and abdominal distention.  Genitourinary: Positive for flank pain. Negative for hematuria.  Neurological: Negative for weakness and numbness.  Hematological: Negative for adenopathy.       Objective:   Physical Exam  Constitutional: He appears well-developed and well-nourished.  Cardiovascular: Normal rate and regular rhythm.   Pulmonary/Chest: Effort normal and breath sounds normal. No respiratory distress. He has no wheezes. He has no rales.  Abdominal: Soft. Bowel sounds are normal. He exhibits no distension and no mass. There is no tenderness. There is no rebound and no guarding.  Musculoskeletal: He exhibits no edema.  Straight leg raises are negative bilaterally  Neurological:  Full-strength lower extremities. Symmetric reflexes knee and ankle bilaterally.  Skin: No rash noted.          Assessment & Plan:  #1 left flank pain. Unclear etiology. He does not have any exacerbating or alleviating factors to suggest likely musculoskeletal origin. Start with urinalysis. Consider abdominal/ pelvic ultrasound #2 fleeting right lower extremity pain. Suspect neuropathic. No associated weakness or numbness. Observe for now

## 2013-08-28 NOTE — Progress Notes (Signed)
Pre visit review using our clinic review tool, if applicable. No additional management support is needed unless otherwise documented below in the visit note. 

## 2013-09-04 ENCOUNTER — Other Ambulatory Visit: Payer: Medicare Other

## 2013-09-07 ENCOUNTER — Telehealth: Payer: Self-pay

## 2013-09-07 ENCOUNTER — Ambulatory Visit
Admission: RE | Admit: 2013-09-07 | Discharge: 2013-09-07 | Disposition: A | Discharge status: Home / Self Care | Payer: Medicare Other | Source: Ambulatory Visit | Attending: Family Medicine | Admitting: Family Medicine

## 2013-09-07 DIAGNOSIS — R109 Unspecified abdominal pain: Secondary | ICD-10-CM

## 2013-09-07 NOTE — Telephone Encounter (Signed)
Let's try Tramadol 50 mg 1-2 po q 6 hours prn pain #60 with one refill (since he had already taken Tylenol and Advil without relief).

## 2013-09-07 NOTE — Telephone Encounter (Signed)
Pt wants to know if there is any meds that he can take for pain.

## 2013-09-08 MED ORDER — TRAMADOL HCL 50 MG PO TABS: ORAL_TABLET | ORAL | Status: DC

## 2013-09-08 NOTE — Telephone Encounter (Signed)
RX called into pharmacy and pt is aware.

## 2013-09-08 NOTE — Telephone Encounter (Signed)
Left message for patient to return call.

## 2013-10-10 ENCOUNTER — Other Ambulatory Visit: Payer: Self-pay

## 2013-10-10 MED ORDER — ONETOUCH LANCETS MISC
Status: DC
Start: 2013-10-10 — End: 2014-05-04

## 2013-10-10 MED ORDER — GLUCOSE BLOOD VI STRP: ORAL_STRIP | Status: DC

## 2013-10-23 ENCOUNTER — Encounter: Payer: Self-pay | Admitting: Family Medicine

## 2013-10-23 ENCOUNTER — Ambulatory Visit (INDEPENDENT_AMBULATORY_CARE_PROVIDER_SITE_OTHER): Payer: Medicare Other | Admitting: Family Medicine

## 2013-10-23 VITALS — BP 120/70 | HR 81 | Temp 98.2°F | Wt 179.0 lb

## 2013-10-23 DIAGNOSIS — I10 Essential (primary) hypertension: Secondary | ICD-10-CM

## 2013-10-23 NOTE — Patient Instructions (Signed)
Follow up for any recurrent episodes of nausea/sweats or for any  New symptoms.

## 2013-10-23 NOTE — Progress Notes (Signed)
Pre visit review using our clinic review tool, if applicable. No additional management support is needed unless otherwise documented below in the visit note. 

## 2013-10-23 NOTE — Progress Notes (Signed)
   Subjective:    Patient ID: Kevin Wise, male    DOB: 07-Dec-1935, 78 y.o.   MRN: [id number]  HPI  Here with concern over elevated blood pressure reading yesterday. Yesterday around 1 to 2 PM he had episode of some nausea and sweats. He took his blood pressure and had a couple readings 150 systolic and 90s diastolic. He had taken a couple of Aleve earlier. He denied any chest pains. No dyspnea. CBGs stable.  He takes quinapril HCTZ for hypertension and has been compliant with therapy. Blood pressures are generally very well controlled. He feels well today. Appetite is back to normal. He denies any recent abdominal pain.  Past Medical History  Diagnosis Date  . HYPOTHYROIDISM 01/10/2008  . VITAMIN D DEFICIENCY 01/10/2008  . HYPERLIPIDEMIA 01/06/2007  . ANEMIA 01/10/2008  . ERECTILE DYSFUNCTION 01/06/2007  . HYPERTENSION, BENIGN ESSENTIAL 01/06/2007  . CAROTID ARTERY STENOSIS, RIGHT 01/10/2009  . Rosacea 01/06/2007  . ARTHRITIS 01/06/2007  . HYPERGLYCEMIA, BORDERLINE 12/02/2009  . Other symptoms involving cardiovascular system 01/10/2009  . UNS ADVRS EFF OTH RX MEDICINAL&BIOLOGICAL SBSTNC 01/10/2008  . LIVER FUNCTION TESTS, ABNORMAL, HX OF 12/02/2009   Past Surgical History  Procedure Laterality Date  . Hernia repair      bilateral inguinal   . Tonsillectomy      reports that he has never smoked. He does not have any smokeless tobacco history on file. His alcohol and drug histories are not on file. family history includes Diabetes in his mother; Hyperlipidemia in his mother. No Known Allergies    Review of Systems  Constitutional: Negative for fatigue and unexpected weight change.  Eyes: Negative for visual disturbance.  Respiratory: Negative for cough, chest tightness and shortness of breath.   Cardiovascular: Negative for chest pain, palpitations and leg swelling.  Endocrine: Negative for polydipsia and polyuria.  Neurological: Negative for dizziness, syncope, weakness,  light-headedness and headaches.       Objective:   Physical Exam  Constitutional: He is oriented to person, place, and time. He appears well-developed and well-nourished.  Eyes: Pupils are equal, round, and reactive to light.  Cardiovascular: Normal rate and regular rhythm.  Exam reveals no gallop.   Pulmonary/Chest: Effort normal and breath sounds normal. No respiratory distress. He has no wheezes. He has no rales.  Musculoskeletal: He exhibits no edema.  Neurological: He is alert and oriented to person, place, and time.          Assessment & Plan:  #1 hypertension. Stable with left arm seated 120/70 today. He had minimal elevation yesterday. He'll continue to monitor #2 transient nausea without vomiting yesterday. He had no other associated symptoms. Denies any diarrhea or knee pain. Observe and followup promptly for any recurrent episodes

## 2013-11-22 ENCOUNTER — Telehealth: Payer: Self-pay | Admitting: Family Medicine

## 2013-11-22 NOTE — Telephone Encounter (Signed)
Pt called to ask if we had the high dose flu vaccine I told him we were out.  He would like to know what Dr [name] recommend as for the flu shot.

## 2013-11-22 NOTE — Telephone Encounter (Signed)
I got one High dose that i give the patient. Previously the patient was just getting the regular flu injection. Left message on patient VM informing patient that I would hold injection for a day.

## 2013-11-23 ENCOUNTER — Ambulatory Visit (INDEPENDENT_AMBULATORY_CARE_PROVIDER_SITE_OTHER): Payer: Medicare Other | Admitting: Family Medicine

## 2013-11-23 DIAGNOSIS — Z23 Encounter for immunization: Secondary | ICD-10-CM

## 2013-12-29 ENCOUNTER — Ambulatory Visit (INDEPENDENT_AMBULATORY_CARE_PROVIDER_SITE_OTHER): Payer: Medicare Other | Admitting: Family Medicine

## 2013-12-29 ENCOUNTER — Encounter: Payer: Self-pay | Admitting: Family Medicine

## 2013-12-29 VITALS — BP 126/80 | HR 81 | Temp 98.1°F | Wt 174.0 lb

## 2013-12-29 DIAGNOSIS — E119 Type 2 diabetes mellitus without complications: Secondary | ICD-10-CM

## 2013-12-29 LAB — HEMOGLOBIN A1C: Hgb A1c MFr Bld: 6.6 % — ABNORMAL HIGH (ref 4.6–6.5)

## 2013-12-29 NOTE — Progress Notes (Signed)
Subjective:    Patient ID: Kevin Wise, male    DOB: 02-May-1935, 78 y.o.   MRN: [id number]  HPI   78 year old male presents for 4 month follow up on HTN.  Last appointment was for an isolated incident of feeling the pressure was too high and transient nausea.  Today, he denies any recurrence in symptoms.  Denies recent headaches, nausea, chest pains, SOB, weakness or falls.  No other issues with sugars at home regarding his diabetes.     Review of Systems  Constitutional: Negative for fever, chills, activity change, appetite change and fatigue.  HENT: Negative.   Respiratory: Negative for cough, chest tightness and shortness of breath.   Cardiovascular: Negative for chest pain and leg swelling.  Gastrointestinal: Negative.   Musculoskeletal: Negative.   Neurological: Negative for dizziness, weakness, light-headedness, numbness and headaches.   Past Medical History  Diagnosis Date  . HYPOTHYROIDISM 01/10/2008  . VITAMIN D DEFICIENCY 01/10/2008  . HYPERLIPIDEMIA 01/06/2007  . ANEMIA 01/10/2008  . ERECTILE DYSFUNCTION 01/06/2007  . HYPERTENSION, BENIGN ESSENTIAL 01/06/2007  . CAROTID ARTERY STENOSIS, RIGHT 01/10/2009  . Rosacea 01/06/2007  . ARTHRITIS 01/06/2007  . HYPERGLYCEMIA, BORDERLINE 12/02/2009  . Other symptoms involving cardiovascular system 01/10/2009  . UNS ADVRS EFF OTH RX MEDICINAL&BIOLOGICAL SBSTNC 01/10/2008  . LIVER FUNCTION TESTS, ABNORMAL, HX OF 12/02/2009   Current Outpatient Prescriptions on File Prior to Visit  Medication Sig Dispense Refill  . aspirin 81 MG tablet Take 81 mg by mouth daily.      Marland Kitchen atorvastatin (LIPITOR) 40 MG tablet Take 1 tablet (40 mg total) by mouth daily. 90 tablet 3  . Cholecalciferol (VITAMIN D) 2000 UNITS CAPS Take 2,000 Units by mouth daily.      . fluticasone (FLONASE) 50 MCG/ACT nasal spray Place 2 sprays into both nostrils daily. 16 g 3  . gabapentin (NEURONTIN) 300 MG capsule Take 1 capsule (300 mg total) by mouth 2 (two)  times daily. 90 capsule 3  . glucose blood test strip One Touch Delica. Check sugar 2 times daily. DX: 250.00 200 each 2  . HYDROcodone-acetaminophen (NORCO/VICODIN) 5-325 MG per tablet Take 1 tablet by mouth every 6 (six) hours as needed for moderate pain. 30 tablet 0  . ibuprofen (ADVIL,MOTRIN) 800 MG tablet Take 1 tablet (800 mg total) by mouth every 8 (eight) hours as needed. 60 tablet 1  . metFORMIN (GLUCOPHAGE) 850 MG tablet Take 1 tablet (850 mg total) by mouth 2 (two) times daily with a meal. 180 tablet 3  . ONE TOUCH LANCETS MISC ONE TOUCH VERIO LANCETS. Check sugar 2 times  daily. DX: 250.00 200 each 2  . quinapril-hydrochlorothiazide (ACCURETIC) 20-12.5 MG per tablet Take 1 tablet by mouth 2 (two) times daily. 180 tablet 3  . sildenafil (VIAGRA) 100 MG tablet Take 1 tablet (100 mg total) by mouth daily as needed. 10 tablet 3  . traMADol (ULTRAM) 50 MG tablet Take 1-2 tablets by mouth every 6 hours as needed for pain 60 tablet 1   No current facility-administered medications on file prior to visit.   Family History  Problem Relation Age of Onset  . Hyperlipidemia Mother   . Diabetes Mother    History   Social History  . Marital Status: Married    Spouse Name: N/A    Number of Children: N/A  . Years of Education: N/A   Occupational History  . Not on file.   Social History Main Topics  . Smoking status: Never  Smoker   . Smokeless tobacco: Not on file  . Alcohol Use: Not on file  . Drug Use: Not on file  . Sexual Activity: Not on file   Other Topics Concern  . Not on file   Social History Narrative        Objective:   Physical Exam  Constitutional: He is oriented to person, place, and time. He appears well-developed and well-nourished. No distress.  Neck: Normal range of motion. Neck supple. No JVD present. No tracheal deviation present. No thyromegaly present.  Cardiovascular: Normal rate, regular rhythm and normal heart sounds.   No murmur  heard. Pulmonary/Chest: Effort normal and breath sounds normal. No respiratory distress. He has no wheezes.  Lymphadenopathy:    He has no cervical adenopathy.  Neurological: He is alert and oriented to person, place, and time.  Skin: He is not diaphoretic.  Nursing note and vitals reviewed.       Assessment & Plan:  1.  HTN- well controlled.  No more incidents of HTN at home.  Today BP was 126/80.  Continue on medications   2.  DM type II- check A1C today.  States sugars are well controlled at home.  A1C has previously improved from 7.3 to 6.8   Luiz Ochoa, PA-S   Agree with above.  HTN and diabetes appear to be controlled at this time.  Evelena Peat MD

## 2013-12-29 NOTE — Progress Notes (Signed)
Pre visit review using our clinic review tool, if applicable. No additional management support is needed unless otherwise documented below in the visit note. 

## 2014-02-07 ENCOUNTER — Ambulatory Visit (INDEPENDENT_AMBULATORY_CARE_PROVIDER_SITE_OTHER): Payer: Medicare Other | Admitting: Family Medicine

## 2014-02-07 ENCOUNTER — Encounter: Payer: Self-pay | Admitting: Family Medicine

## 2014-02-07 VITALS — BP 128/68 | HR 89 | Temp 97.9°F | Wt 179.0 lb

## 2014-02-07 DIAGNOSIS — M25519 Pain in unspecified shoulder: Secondary | ICD-10-CM

## 2014-02-07 DIAGNOSIS — R079 Chest pain, unspecified: Secondary | ICD-10-CM

## 2014-02-07 NOTE — Progress Notes (Signed)
   Subjective:    Patient ID: Kevin Wise, male    DOB: November 05, 1935, 78 y.o.   MRN: [id number]  HPI Patient seen as a work in with left shoulder pain onset about a week ago. he had some bilateral shoulder pain especially at night with soreness with rolling onto each shoulder. He has had some intermittent left chest pain as well- never exertional. No dyspnea. No upper extremity numbness or weakness. He does have some pain which is reproduced with abduction over about 100. No radiculopathy symptoms. No known injury. He's taken Tylenol mild relief. No relief with Aleve or Advil. No exertional symptoms. He describes achy pain relatively mild.  He has no cardiac history.  Past Medical History  Diagnosis Date  . HYPOTHYROIDISM 01/10/2008  . VITAMIN D DEFICIENCY 01/10/2008  . HYPERLIPIDEMIA 01/06/2007  . ANEMIA 01/10/2008  . ERECTILE DYSFUNCTION 01/06/2007  . HYPERTENSION, BENIGN ESSENTIAL 01/06/2007  . CAROTID ARTERY STENOSIS, RIGHT 01/10/2009  . Rosacea 01/06/2007  . ARTHRITIS 01/06/2007  . HYPERGLYCEMIA, BORDERLINE 12/02/2009  . Other symptoms involving cardiovascular system 01/10/2009  . UNS ADVRS EFF OTH RX MEDICINAL&BIOLOGICAL SBSTNC 01/10/2008  . LIVER FUNCTION TESTS, ABNORMAL, HX OF 12/02/2009   Past Surgical History  Procedure Laterality Date  . Hernia repair      bilateral inguinal   . Tonsillectomy      reports that he has never smoked. He does not have any smokeless tobacco history on file. His alcohol and drug histories are not on file. family history includes Diabetes in his mother; Hyperlipidemia in his mother. No Known Allergies    Review of Systems  Constitutional: Negative for appetite change and unexpected weight change.  Respiratory: Negative for cough and shortness of breath.   Cardiovascular: Positive for chest pain. Negative for palpitations and leg swelling.  Gastrointestinal: Negative for abdominal pain.  Neurological: Negative for dizziness and syncope.        Objective:   Physical Exam  Constitutional: He appears well-developed and well-nourished.  HENT:  Mouth/Throat: Oropharynx is clear and moist.  Neck: Neck supple. No thyromegaly present.  Cardiovascular: Normal rate and regular rhythm.   Pulmonary/Chest: Effort normal and breath sounds normal. No respiratory distress. He has no wheezes. He has no rales.  Musculoskeletal: He exhibits no edema.  Full range of motion both shoulders. Minimal pain with abduction left shoulder greater than 110 against resistance. No pain with internal rotation. No biceps tenderness. No AC joint tenderness.  Lymphadenopathy:    He has no cervical adenopathy.          Assessment & Plan:  Bilateral shoulder pain L>R.  Atypical chest symptoms.  His symptoms suggest musculoskeletal etiology with worse with movement.  EKG NSR with no acute changes.  Recommend he continue with Tylenol and advil (but caution for prolonged use with age).  Follow up if shoulder pain persists.

## 2014-02-07 NOTE — Progress Notes (Signed)
Pre visit review using our clinic review tool, if applicable. No additional management support is needed unless otherwise documented below in the visit note. 

## 2014-02-07 NOTE — Patient Instructions (Signed)

## 2014-02-28 ENCOUNTER — Telehealth: Payer: Self-pay | Admitting: Family Medicine

## 2014-02-28 NOTE — Telephone Encounter (Signed)
Noted  

## 2014-02-28 NOTE — Telephone Encounter (Signed)
Pt called to say he went to see a Cardiologist in [facility] a Dr Rhona Leavens on 02/22/14  Dr Johnna Acosta office will be faxing over office notes. Pt said if not received by Monday let him know and he will follow up with that office.

## 2014-03-05 NOTE — Telephone Encounter (Signed)
Received notes

## 2014-03-06 ENCOUNTER — Telehealth: Payer: Self-pay | Admitting: Family Medicine

## 2014-03-06 NOTE — Telephone Encounter (Signed)
Notes were in your folder

## 2014-03-06 NOTE — Telephone Encounter (Signed)
Pt is asking if he need to do a visit before his physical in March based on xray on his neck .

## 2014-03-06 NOTE — Telephone Encounter (Signed)
Would look at his symptoms.  If neck pain ongoing and severe set up follow up sooner.

## 2014-03-07 ENCOUNTER — Encounter: Payer: Self-pay | Admitting: Family Medicine

## 2014-03-07 NOTE — Telephone Encounter (Signed)
Pt informed

## 2014-03-08 LAB — HM DIABETES EYE EXAM

## 2014-03-15 ENCOUNTER — Encounter: Payer: Self-pay | Admitting: Family Medicine

## 2014-03-19 ENCOUNTER — Encounter: Payer: Self-pay | Admitting: Family Medicine

## 2014-04-27 ENCOUNTER — Other Ambulatory Visit: Payer: Medicare Other

## 2014-04-30 ENCOUNTER — Other Ambulatory Visit (INDEPENDENT_AMBULATORY_CARE_PROVIDER_SITE_OTHER): Payer: Medicare Other

## 2014-04-30 DIAGNOSIS — Z0000 Encounter for general adult medical examination without abnormal findings: Secondary | ICD-10-CM

## 2014-04-30 DIAGNOSIS — Z125 Encounter for screening for malignant neoplasm of prostate: Secondary | ICD-10-CM | POA: Diagnosis not present

## 2014-04-30 DIAGNOSIS — I10 Essential (primary) hypertension: Secondary | ICD-10-CM | POA: Diagnosis not present

## 2014-04-30 DIAGNOSIS — E785 Hyperlipidemia, unspecified: Secondary | ICD-10-CM

## 2014-04-30 LAB — HEPATIC FUNCTION PANEL
ALT: 22 U/L (ref 0–53)
AST: 19 U/L (ref 0–37)
Albumin: 4.6 g/dL (ref 3.5–5.2)
Alkaline Phosphatase: 44 U/L (ref 39–117)
Bilirubin, Direct: 0.1 mg/dL (ref 0.0–0.3)
Total Bilirubin: 0.5 mg/dL (ref 0.2–1.2)
Total Protein: 7.3 g/dL (ref 6.0–8.3)

## 2014-04-30 LAB — PSA: PSA: 1.1 ng/mL (ref 0.10–4.00)

## 2014-04-30 LAB — CBC WITH DIFFERENTIAL/PLATELET
BASOS ABS: 0 10*3/uL (ref 0.0–0.1)
Basophils Relative: 0.6 % (ref 0.0–3.0)
EOS ABS: 0.2 10*3/uL (ref 0.0–0.7)
Eosinophils Relative: 2.3 % (ref 0.0–5.0)
HCT: 38.7 % — ABNORMAL LOW (ref 39.0–52.0)
Hemoglobin: 12.9 g/dL — ABNORMAL LOW (ref 13.0–17.0)
LYMPHS ABS: 2.9 10*3/uL (ref 0.7–4.0)
Lymphocytes Relative: 40.9 % (ref 12.0–46.0)
MCHC: 33.4 g/dL (ref 30.0–36.0)
MCV: 91.4 fl (ref 78.0–100.0)
MONO ABS: 0.6 10*3/uL (ref 0.1–1.0)
Monocytes Relative: 7.9 % (ref 3.0–12.0)
NEUTROS ABS: 3.4 10*3/uL (ref 1.4–7.7)
Neutrophils Relative %: 48.3 % (ref 43.0–77.0)
Platelets: 244 10*3/uL (ref 150.0–400.0)
RBC: 4.23 Mil/uL (ref 4.22–5.81)
RDW: 13.3 % (ref 11.5–15.5)
WBC: 7.1 10*3/uL (ref 4.0–10.5)

## 2014-04-30 LAB — BASIC METABOLIC PANEL
BUN: 22 mg/dL (ref 6–23)
CALCIUM: 9.9 mg/dL (ref 8.4–10.5)
CO2: 30 mEq/L (ref 19–32)
CREATININE: 1.16 mg/dL (ref 0.40–1.50)
Chloride: 105 mEq/L (ref 96–112)
GFR: 78.66 mL/min (ref >60.00)
GLUCOSE: 102 mg/dL — AB (ref 70–99)
Potassium: 4.5 mEq/L (ref 3.5–5.1)
Sodium: 139 mEq/L (ref 135–145)

## 2014-04-30 LAB — LIPID PANEL
Cholesterol: 117 mg/dL (ref 0–200)
HDL: 42.8 mg/dL (ref >39.00)
LDL CALC: 58 mg/dL (ref 0–99)
NonHDL: 74.2
Total CHOL/HDL Ratio: 3
Triglycerides: 81 mg/dL (ref 0.0–149.0)
VLDL: 16.2 mg/dL (ref 0.0–40.0)

## 2014-04-30 LAB — MICROALBUMIN / CREATININE URINE RATIO
CREATININE, U: 137.3 mg/dL
MICROALB UR: 5.1 mg/dL — AB (ref 0.0–1.9)
Microalb Creat Ratio: 3.7 mg/g (ref 0.0–30.0)

## 2014-04-30 LAB — TSH: TSH: 1.16 u[IU]/mL (ref 0.35–4.50)

## 2014-04-30 LAB — HEMOGLOBIN A1C: HEMOGLOBIN A1C: 6.8 % — AB (ref 4.6–6.5)

## 2014-05-04 ENCOUNTER — Encounter: Payer: Self-pay | Admitting: Family Medicine

## 2014-05-04 ENCOUNTER — Ambulatory Visit (INDEPENDENT_AMBULATORY_CARE_PROVIDER_SITE_OTHER): Payer: Medicare Other | Admitting: Family Medicine

## 2014-05-04 VITALS — BP 128/70 | HR 77 | Temp 98.1°F | Ht 67.25 in | Wt 177.0 lb

## 2014-05-04 DIAGNOSIS — Z0000 Encounter for general adult medical examination without abnormal findings: Secondary | ICD-10-CM

## 2014-05-04 MED ORDER — ATORVASTATIN CALCIUM 40 MG PO TABS: 40.0000 mg | ORAL_TABLET | Freq: Every day | ORAL | Status: DC

## 2014-05-04 MED ORDER — FLUTICASONE PROPIONATE 50 MCG/ACT NA SUSP: 2.0000 | Freq: Every day | NASAL | Status: DC

## 2014-05-04 MED ORDER — GLUCOSE BLOOD VI STRP: ORAL_STRIP | Status: DC

## 2014-05-04 MED ORDER — IBUPROFEN 800 MG PO TABS: 800.0000 mg | ORAL_TABLET | Freq: Three times a day (TID) | ORAL | Status: DC | PRN

## 2014-05-04 MED ORDER — QUINAPRIL-HYDROCHLOROTHIAZIDE 20-12.5 MG PO TABS: 1.0000 | ORAL_TABLET | Freq: Two times a day (BID) | ORAL | Status: DC

## 2014-05-04 MED ORDER — ONETOUCH LANCETS MISC: Status: DC

## 2014-05-04 MED ORDER — GABAPENTIN 300 MG PO CAPS: 300.0000 mg | ORAL_CAPSULE | Freq: Two times a day (BID) | ORAL | Status: DC

## 2014-05-04 MED ORDER — METFORMIN HCL 850 MG PO TABS: 850.0000 mg | ORAL_TABLET | Freq: Two times a day (BID) | ORAL | Status: DC

## 2014-05-04 NOTE — Progress Notes (Signed)
Subjective:    Patient ID: Kevin Wise, male    DOB: 02-Jun-1935, 79 y.o.   MRN: [id number]  HPI Patient here for complete physical. His chronic problems include type 2 diabetes, hyperlipidemia, hypertension, osteoarthritis. He's had some recent issues with right neck pain with occasional right upper extremity radiculopathy symptoms. He went to cardiologist in [facility] who ordered neck films which showed moderate degenerative changes- especially C5-C6 with foraminal narrowing right greater than left. He's not had any weakness. No numbness. Pain is mild to moderate. Not relieved with over-the-counter anti-inflammatories.  Immunizations up-to-date. Colonoscopy up-to-date. Nonsmoker.  Past Medical History  Diagnosis Date  . HYPOTHYROIDISM 01/10/2008  . VITAMIN D DEFICIENCY 01/10/2008  . HYPERLIPIDEMIA 01/06/2007  . ANEMIA 01/10/2008  . ERECTILE DYSFUNCTION 01/06/2007  . HYPERTENSION, BENIGN ESSENTIAL 01/06/2007  . CAROTID ARTERY STENOSIS, RIGHT 01/10/2009  . Rosacea 01/06/2007  . ARTHRITIS 01/06/2007  . HYPERGLYCEMIA, BORDERLINE 12/02/2009  . Other symptoms involving cardiovascular system 01/10/2009  . UNS ADVRS EFF OTH RX MEDICINAL&BIOLOGICAL SBSTNC 01/10/2008  . LIVER FUNCTION TESTS, ABNORMAL, HX OF 12/02/2009   Past Surgical History  Procedure Laterality Date  . Hernia repair      bilateral inguinal   . Tonsillectomy      reports that he has never smoked. He does not have any smokeless tobacco history on file. His alcohol and drug histories are not on file. family history includes Diabetes in his mother; Hyperlipidemia in his mother. No Known Allergies    Review of Systems  Constitutional: Negative for fever, activity change, appetite change and fatigue.  HENT: Negative for congestion, ear pain and trouble swallowing.   Eyes: Negative for pain and visual disturbance.  Respiratory: Negative for cough, shortness of breath and wheezing.   Cardiovascular: Negative for  chest pain and palpitations.  Gastrointestinal: Negative for nausea, vomiting, abdominal pain, diarrhea, constipation, blood in stool, abdominal distention and rectal pain.  Endocrine: Negative for polydipsia and polyuria.  Genitourinary: Negative for dysuria, hematuria and testicular pain.  Musculoskeletal: Positive for arthralgias and neck pain. Negative for joint swelling.  Skin: Negative for rash.  Neurological: Negative for dizziness, syncope and headaches.  Hematological: Negative for adenopathy.  Psychiatric/Behavioral: Negative for confusion and dysphoric mood.       Objective:   Physical Exam  Constitutional: He is oriented to person, place, and time. He appears well-developed and well-nourished. No distress.  HENT:  Head: Normocephalic and atraumatic.  Right Ear: External ear normal.  Left Ear: External ear normal.  Mouth/Throat: Oropharynx is clear and moist.  Eyes: Conjunctivae and EOM are normal. Pupils are equal, round, and reactive to light.  Neck: Normal range of motion. Neck supple. No thyromegaly present.  Cardiovascular: Normal rate, regular rhythm and normal heart sounds.   No murmur heard. Pulmonary/Chest: No respiratory distress. He has no wheezes. He has no rales.  Abdominal: Soft. Bowel sounds are normal. He exhibits no distension and no mass. There is no tenderness. There is no rebound and no guarding.  Musculoskeletal: He exhibits no edema.  Lymphadenopathy:    He has no cervical adenopathy.  Neurological: He is alert and oriented to person, place, and time. He displays normal reflexes. No cranial nerve deficit.  No upper extremity weakness. Right biceps reflex slightly diminished compared to left  Skin: No rash noted.  Psychiatric: He has a normal mood and affect.          Assessment & Plan:  Complete physical. Immunizations up-to-date. Colonoscopy up-to-date. Refill all regular medications.  Start back gabapentin 300 mg daily at bedtime and after few  days titrate to 300 mg twice a day if needed-for his neck pain. Follow-up promptly for any upper history weakness or numbness. Will plan routine follow up in 3 months and repeat A1C then.

## 2014-05-04 NOTE — Patient Instructions (Signed)
Start gabapentin 300 mg at night only. After 1 week if daytime pain is severe may increase to 1 tablet twice daily

## 2014-05-04 NOTE — Progress Notes (Signed)
Pre visit review using our clinic review tool, if applicable. No additional management support is needed unless otherwise documented below in the visit note. 

## 2014-08-06 ENCOUNTER — Ambulatory Visit (INDEPENDENT_AMBULATORY_CARE_PROVIDER_SITE_OTHER): Payer: Medicare Other | Admitting: Family Medicine

## 2014-08-06 ENCOUNTER — Encounter: Payer: Self-pay | Admitting: Family Medicine

## 2014-08-06 VITALS — BP 110/70 | HR 60 | Temp 98.2°F | Wt 175.0 lb

## 2014-08-06 DIAGNOSIS — M542 Cervicalgia: Secondary | ICD-10-CM

## 2014-08-06 DIAGNOSIS — E119 Type 2 diabetes mellitus without complications: Secondary | ICD-10-CM

## 2014-08-06 DIAGNOSIS — I10 Essential (primary) hypertension: Secondary | ICD-10-CM

## 2014-08-06 LAB — HM DIABETES FOOT EXAM: HM Diabetic Foot Exam: NORMAL

## 2014-08-06 LAB — HEMOGLOBIN A1C: HEMOGLOBIN A1C: 6.4 % (ref 4.6–6.5)

## 2014-08-06 MED ORDER — TRAMADOL HCL 50 MG PO TABS: ORAL_TABLET | ORAL | Status: DC

## 2014-08-06 NOTE — Progress Notes (Signed)
   Subjective:    Patient ID: Kevin Wise, male    DOB: Oct 03, 1935, 79 y.o.   MRN: [id number]  HPI  Patient here for follow-up several items  Type 2 diabetes. History of good control. Last A1c 6.8%. Blood sugars have been stable by home readings. He remains on metformin alone. Fairly diligent with following diet. Exercises about 3-4 days per week.  Eye exam in October no retinopathy  Hypertension. History of good control with quinapril HCTZ. No dizziness. No recent chest pains.  Recent cervical neck pains. Going on for several months. He had cervical spine films last December which showed degenerative changes, especially at C5-C6. He has occasional right upper extremity radiculopathy pain. No weakness. No numbness. Pain is moderate in disrupting sleep at night. He tried gabapentin without improvement. Denies any recent injury.  Past Medical History  Diagnosis Date  . HYPOTHYROIDISM 01/10/2008  . VITAMIN D DEFICIENCY 01/10/2008  . HYPERLIPIDEMIA 01/06/2007  . ANEMIA 01/10/2008  . ERECTILE DYSFUNCTION 01/06/2007  . HYPERTENSION, BENIGN ESSENTIAL 01/06/2007  . CAROTID ARTERY STENOSIS, RIGHT 01/10/2009  . Rosacea 01/06/2007  . ARTHRITIS 01/06/2007  . HYPERGLYCEMIA, BORDERLINE 12/02/2009  . Other symptoms involving cardiovascular system 01/10/2009  . UNS ADVRS EFF OTH RX MEDICINAL&BIOLOGICAL SBSTNC 01/10/2008  . LIVER FUNCTION TESTS, ABNORMAL, HX OF 12/02/2009   Past Surgical History  Procedure Laterality Date  . Hernia repair      bilateral inguinal   . Tonsillectomy      reports that he has never smoked. He does not have any smokeless tobacco history on file. His alcohol and drug histories are not on file. family history includes Diabetes in his mother; Hyperlipidemia in his mother. No Known Allergies   Review of Systems  Constitutional: Negative for appetite change, fatigue and unexpected weight change.  Eyes: Negative for visual disturbance.  Respiratory: Negative for  cough, chest tightness and shortness of breath.   Cardiovascular: Negative for chest pain, palpitations and leg swelling.  Genitourinary: Negative for dysuria.  Musculoskeletal: Positive for neck pain.  Neurological: Negative for dizziness, syncope, weakness, light-headedness, numbness and headaches.       Objective:   Physical Exam  Constitutional: He appears well-developed and well-nourished.  Neck: Neck supple. No thyromegaly present.  Cardiovascular: Normal rate and regular rhythm.   Pulmonary/Chest: Effort normal and breath sounds normal. No respiratory distress. He has no wheezes. He has no rales.  Musculoskeletal: He exhibits no edema.  Neurological:  Full strength upper extremities. Symmetric reflexes.  Skin:  He has a couple small calluses one on each foot laterally and distally but no ulceration. Monofilament testing is normal throughout. Both feet are warm to touch with good distal foot pulses          Assessment & Plan:  #1 type 2 diabetes. History of good control. Repeat A1c #2 hypertension stable and at goal. Continue current medication. #3 cervical neck pain. Suspect degenerative in nature. Nonfocal exam neurologically. Poor control with gabapentin. Trial of tramadol 50 mg 1-2 every 6 hours for severe pain. Consider cervical MRI if not improving with the above

## 2014-08-06 NOTE — Progress Notes (Signed)
Pre visit review using our clinic review tool, if applicable. No additional management support is needed unless otherwise documented below in the visit note. 

## 2014-11-12 ENCOUNTER — Ambulatory Visit (INDEPENDENT_AMBULATORY_CARE_PROVIDER_SITE_OTHER): Payer: Medicare Other | Admitting: Family Medicine

## 2014-11-12 ENCOUNTER — Encounter: Payer: Self-pay | Admitting: Family Medicine

## 2014-11-12 VITALS — BP 124/78 | HR 64 | Temp 98.3°F | Ht 67.25 in | Wt 166.7 lb

## 2014-11-12 DIAGNOSIS — E785 Hyperlipidemia, unspecified: Secondary | ICD-10-CM | POA: Diagnosis not present

## 2014-11-12 DIAGNOSIS — Z23 Encounter for immunization: Secondary | ICD-10-CM | POA: Diagnosis not present

## 2014-11-12 DIAGNOSIS — E119 Type 2 diabetes mellitus without complications: Secondary | ICD-10-CM | POA: Diagnosis not present

## 2014-11-12 DIAGNOSIS — I10 Essential (primary) hypertension: Secondary | ICD-10-CM

## 2014-11-12 DIAGNOSIS — M542 Cervicalgia: Secondary | ICD-10-CM

## 2014-11-12 LAB — HEMOGLOBIN A1C: HEMOGLOBIN A1C: 6.5 % (ref 4.6–6.5)

## 2014-11-12 NOTE — Progress Notes (Signed)
   Subjective:    Patient ID: Kevin Wise, male    DOB: February 05, 1936, 79 y.o.   MRN: [id number]  HPI Patient seen for medical follow-up  Type 2 diabetes. History of good control. No polyuria or polydipsia. Last A1c 6.4%. Just had eye exam about a month ago. Possible early glaucoma changes.  Hypertension. Treated with quinapril HCTZ. Blood pressures been stable. No dizziness. No recent chest pains.  Ongoing cervical neck pains. He has radiation toward the right arm. No upper extremity weakness. Pain is usually 4-5 out of 10 severity. He had previous x-rays about a year ago showed degenerative changes specialists he 5 C6. He is taken tramadol along with Tylenol and gabapentin but his had minimal relief with any medications.  Past Medical History  Diagnosis Date  . HYPOTHYROIDISM 01/10/2008  . VITAMIN D DEFICIENCY 01/10/2008  . HYPERLIPIDEMIA 01/06/2007  . ANEMIA 01/10/2008  . ERECTILE DYSFUNCTION 01/06/2007  . HYPERTENSION, BENIGN ESSENTIAL 01/06/2007  . CAROTID ARTERY STENOSIS, RIGHT 01/10/2009  . Rosacea 01/06/2007  . ARTHRITIS 01/06/2007  . HYPERGLYCEMIA, BORDERLINE 12/02/2009  . Other symptoms involving cardiovascular system 01/10/2009  . UNS ADVRS EFF OTH RX MEDICINAL&BIOLOGICAL SBSTNC 01/10/2008  . LIVER FUNCTION TESTS, ABNORMAL, HX OF 12/02/2009   Past Surgical History  Procedure Laterality Date  . Hernia repair      bilateral inguinal   . Tonsillectomy      reports that he has never smoked. He does not have any smokeless tobacco history on file. His alcohol and drug histories are not on file. family history includes Diabetes in his mother; Hyperlipidemia in his mother. No Known Allergies    Review of Systems  Constitutional: Negative for fatigue.  Eyes: Negative for visual disturbance.  Respiratory: Negative for cough, chest tightness and shortness of breath.   Cardiovascular: Negative for chest pain, palpitations and leg swelling.  Endocrine: Negative for  polydipsia and polyuria.  Musculoskeletal: Positive for neck pain.  Neurological: Negative for dizziness, syncope, weakness, light-headedness and headaches.       Objective:   Physical Exam  Constitutional: He is oriented to person, place, and time. He appears well-developed and well-nourished.  HENT:  Right Ear: External ear normal.  Left Ear: External ear normal.  Mouth/Throat: Oropharynx is clear and moist.  Eyes: Pupils are equal, round, and reactive to light.  Neck: Neck supple. No thyromegaly present.  Cardiovascular: Normal rate and regular rhythm.   Pulmonary/Chest: Effort normal and breath sounds normal. No respiratory distress. He has no wheezes. He has no rales.  Musculoskeletal: He exhibits no edema.  Full range of motion right shoulder.  Neurological: He is alert and oriented to person, place, and time.  No upper extremity weakness and symmetric reflexes          Assessment & Plan:  #1 type 2 diabetes. History of good control. Recheck A1c. #2 hypertension. Initial blood pressure up here. Repeat reading down to normal range. Continue current medications  #3 chronic cervical neck pain. Suspect degenerative spondylosis. Nonfocal exam neurologically. Patient inquiring about next step. He has not responded to medications. We discussed possible referral to spine specialist and he will consider.

## 2014-11-12 NOTE — Progress Notes (Signed)
Pre visit review using our clinic review tool, if applicable. No additional management support is needed unless otherwise documented below in the visit note. 

## 2015-02-11 ENCOUNTER — Encounter: Payer: Self-pay | Admitting: Family Medicine

## 2015-02-11 ENCOUNTER — Ambulatory Visit (INDEPENDENT_AMBULATORY_CARE_PROVIDER_SITE_OTHER): Payer: Medicare Other | Admitting: Family Medicine

## 2015-02-11 VITALS — BP 110/64 | HR 73 | Temp 97.8°F | Resp 16 | Ht 67.25 in | Wt 179.0 lb

## 2015-02-11 DIAGNOSIS — E119 Type 2 diabetes mellitus without complications: Secondary | ICD-10-CM

## 2015-02-11 NOTE — Progress Notes (Signed)
   Subjective:    Patient ID: Kevin Wise, male    DOB: 1935-10-05, 79 y.o.   MRN: [id number]  HPI  Follow-up type 2 diabetes History of excellent control. Last A1c 6.5%. Recent fasting blood sugars have been frequently over 130. Recent poor compliance with diet. No polyuria or polydipsia. He gets eye exams once yearly. No neuropathy history or retinopathy  Has upcoming physical scheduled. Already had flu vaccine.  Past Medical History  Diagnosis Date  . HYPOTHYROIDISM 01/10/2008  . VITAMIN D DEFICIENCY 01/10/2008  . HYPERLIPIDEMIA 01/06/2007  . ANEMIA 01/10/2008  . ERECTILE DYSFUNCTION 01/06/2007  . HYPERTENSION, BENIGN ESSENTIAL 01/06/2007  . CAROTID ARTERY STENOSIS, RIGHT 01/10/2009  . Rosacea 01/06/2007  . ARTHRITIS 01/06/2007  . HYPERGLYCEMIA, BORDERLINE 12/02/2009  . Other symptoms involving cardiovascular system 01/10/2009  . UNS ADVRS EFF OTH RX MEDICINAL&BIOLOGICAL SBSTNC 01/10/2008  . LIVER FUNCTION TESTS, ABNORMAL, HX OF 12/02/2009   Past Surgical History  Procedure Laterality Date  . Hernia repair      bilateral inguinal   . Tonsillectomy      reports that he has never smoked. He does not have any smokeless tobacco history on file. His alcohol and drug histories are not on file. family history includes Diabetes in his mother; Hyperlipidemia in his mother. No Known Allergies   Review of Systems  Constitutional: Negative for fatigue.  Eyes: Negative for visual disturbance.  Respiratory: Negative for cough, chest tightness and shortness of breath.   Cardiovascular: Negative for chest pain, palpitations and leg swelling.  Endocrine: Negative for polydipsia and polyuria.  Neurological: Negative for dizziness, syncope, weakness, light-headedness and headaches.       Objective:   Physical Exam  Constitutional: He appears well-developed and well-nourished. No distress.  Neck: Neck supple. No thyromegaly present.  Cardiovascular: Normal rate and regular  rhythm.   Pulmonary/Chest: Effort normal and breath sounds normal. No respiratory distress. He has no wheezes. He has no rales.  Musculoskeletal: He exhibits no edema.          Assessment & Plan:  Diabetes. History of good control. We elected not to get labs today. He has upcoming physical in January and check A1c then. Continue current medications

## 2015-02-11 NOTE — Progress Notes (Signed)
Pre visit review using our clinic review tool, if applicable. No additional management support is needed unless otherwise documented below in the visit note. 

## 2015-03-11 ENCOUNTER — Other Ambulatory Visit (INDEPENDENT_AMBULATORY_CARE_PROVIDER_SITE_OTHER): Payer: Medicare Other

## 2015-03-11 DIAGNOSIS — Z0000 Encounter for general adult medical examination without abnormal findings: Secondary | ICD-10-CM | POA: Diagnosis not present

## 2015-03-11 LAB — CBC WITH DIFFERENTIAL/PLATELET
Basophils Absolute: 0 10*3/uL (ref 0.0–0.1)
Basophils Relative: 0.5 % (ref 0.0–3.0)
EOS PCT: 2.4 % (ref 0.0–5.0)
Eosinophils Absolute: 0.2 10*3/uL (ref 0.0–0.7)
HEMATOCRIT: 42.7 % (ref 39.0–52.0)
HEMOGLOBIN: 13.9 g/dL (ref 13.0–17.0)
LYMPHS PCT: 38.6 % (ref 12.0–46.0)
Lymphs Abs: 3.3 10*3/uL (ref 0.7–4.0)
MCHC: 32.6 g/dL (ref 30.0–36.0)
MCV: 92.4 fl (ref 78.0–100.0)
MONOS PCT: 6.7 % (ref 3.0–12.0)
Monocytes Absolute: 0.6 10*3/uL (ref 0.1–1.0)
Neutro Abs: 4.5 10*3/uL (ref 1.4–7.7)
Neutrophils Relative %: 51.8 % (ref 43.0–77.0)
Platelets: 264 10*3/uL (ref 150.0–400.0)
RBC: 4.63 Mil/uL (ref 4.22–5.81)
RDW: 13.4 % (ref 11.5–15.5)
WBC: 8.7 10*3/uL (ref 4.0–10.5)

## 2015-03-11 LAB — HEPATIC FUNCTION PANEL
ALBUMIN: 4.6 g/dL (ref 3.5–5.2)
ALK PHOS: 54 U/L (ref 39–117)
ALT: 22 U/L (ref 0–53)
AST: 19 U/L (ref 0–37)
BILIRUBIN DIRECT: 0.1 mg/dL (ref 0.0–0.3)
BILIRUBIN TOTAL: 0.6 mg/dL (ref 0.2–1.2)
TOTAL PROTEIN: 7.6 g/dL (ref 6.0–8.3)

## 2015-03-11 LAB — MICROALBUMIN / CREATININE URINE RATIO
CREATININE, U: 177.1 mg/dL
MICROALB UR: 5.5 mg/dL — AB (ref 0.0–1.9)
Microalb Creat Ratio: 3.1 mg/g (ref 0.0–30.0)

## 2015-03-11 LAB — LIPID PANEL
CHOLESTEROL: 133 mg/dL (ref 0–200)
HDL: 40.2 mg/dL (ref >39.00)
LDL CALC: 74 mg/dL (ref 0–99)
NonHDL: 93.08
TRIGLYCERIDES: 95 mg/dL (ref 0.0–149.0)
Total CHOL/HDL Ratio: 3
VLDL: 19 mg/dL (ref 0.0–40.0)

## 2015-03-11 LAB — BASIC METABOLIC PANEL
BUN: 20 mg/dL (ref 6–23)
CO2: 25 mEq/L (ref 19–32)
Calcium: 9.7 mg/dL (ref 8.4–10.5)
Chloride: 102 mEq/L (ref 96–112)
Creatinine, Ser: 1.21 mg/dL (ref 0.40–1.50)
GFR: 74.75 mL/min (ref >60.00)
Glucose, Bld: 111 mg/dL — ABNORMAL HIGH (ref 70–99)
POTASSIUM: 4.2 meq/L (ref 3.5–5.1)
SODIUM: 141 meq/L (ref 135–145)

## 2015-03-11 LAB — PSA: PSA: 0.92 ng/mL (ref 0.10–4.00)

## 2015-03-11 LAB — TSH: TSH: 1.08 u[IU]/mL (ref 0.35–4.50)

## 2015-03-11 LAB — HEMOGLOBIN A1C: HEMOGLOBIN A1C: 6.9 % — AB (ref 4.6–6.5)

## 2015-03-18 ENCOUNTER — Ambulatory Visit (INDEPENDENT_AMBULATORY_CARE_PROVIDER_SITE_OTHER): Payer: Medicare Other | Admitting: Family Medicine

## 2015-03-18 ENCOUNTER — Encounter: Payer: Self-pay | Admitting: Family Medicine

## 2015-03-18 VITALS — BP 104/80 | HR 77 | Temp 98.3°F | Ht 67.25 in | Wt 174.7 lb

## 2015-03-18 DIAGNOSIS — Z0000 Encounter for general adult medical examination without abnormal findings: Secondary | ICD-10-CM | POA: Diagnosis not present

## 2015-03-18 MED ORDER — METFORMIN HCL 850 MG PO TABS: 850.0000 mg | ORAL_TABLET | Freq: Two times a day (BID) | ORAL | Status: DC

## 2015-03-18 MED ORDER — ATORVASTATIN CALCIUM 40 MG PO TABS: 40.0000 mg | ORAL_TABLET | Freq: Every day | ORAL | Status: DC

## 2015-03-18 MED ORDER — GLUCOSE BLOOD VI STRP: ORAL_STRIP | Status: DC

## 2015-03-18 MED ORDER — ONETOUCH LANCETS MISC: Status: DC

## 2015-03-18 MED ORDER — FLUTICASONE PROPIONATE 50 MCG/ACT NA SUSP: 2.0000 | Freq: Every day | NASAL | Status: DC

## 2015-03-18 MED ORDER — QUINAPRIL-HYDROCHLOROTHIAZIDE 20-12.5 MG PO TABS: 1.0000 | ORAL_TABLET | Freq: Two times a day (BID) | ORAL | Status: DC

## 2015-03-18 NOTE — Progress Notes (Signed)
   Subjective:    Patient ID: Kevin Wise, male    DOB: May 08, 1935, 80 y.o.   MRN: [id number]  HPI  patient here for complete physical. His chronic problems include hypertension, history of peripheral vascular disease, hyperlipidemia , type 2 diabetes. Medications reviewed. Compliant with all. Blood pressure been very well controlled. Colonoscopy up-to-date. Immunizations are all up-to-date. No recent falls. No depression issues. Blood sugars of remains stable by home readings. No recent hypoglycemia.  Past Medical History  Diagnosis Date  . HYPOTHYROIDISM 01/10/2008  . VITAMIN D DEFICIENCY 01/10/2008  . HYPERLIPIDEMIA 01/06/2007  . ANEMIA 01/10/2008  . ERECTILE DYSFUNCTION 01/06/2007  . HYPERTENSION, BENIGN ESSENTIAL 01/06/2007  . CAROTID ARTERY STENOSIS, RIGHT 01/10/2009  . Rosacea 01/06/2007  . ARTHRITIS 01/06/2007  . HYPERGLYCEMIA, BORDERLINE 12/02/2009  . Other symptoms involving cardiovascular system 01/10/2009  . UNS ADVRS EFF OTH RX MEDICINAL&BIOLOGICAL SBSTNC 01/10/2008  . LIVER FUNCTION TESTS, ABNORMAL, HX OF 12/02/2009   Past Surgical History  Procedure Laterality Date  . Hernia repair      bilateral inguinal   . Tonsillectomy      reports that he has never smoked. He does not have any smokeless tobacco history on file. His alcohol and drug histories are not on file. family history includes Diabetes in his mother; Hyperlipidemia in his mother. No Known Allergies    Review of Systems  Constitutional: Negative for fever, activity change, appetite change and fatigue.  HENT: Negative for congestion, ear pain and trouble swallowing.   Eyes: Negative for pain and visual disturbance.  Respiratory: Negative for cough, shortness of breath and wheezing.   Cardiovascular: Negative for chest pain and palpitations.  Gastrointestinal: Negative for nausea, vomiting, abdominal pain, diarrhea, constipation, blood in stool, abdominal distention and rectal pain.  Genitourinary:  Negative for dysuria, hematuria and testicular pain.  Musculoskeletal: Negative for joint swelling and arthralgias.  Skin: Negative for rash.  Neurological: Negative for dizziness, syncope and headaches.  Hematological: Negative for adenopathy.  Psychiatric/Behavioral: Negative for confusion and dysphoric mood.       Objective:   Physical Exam  Constitutional: He is oriented to person, place, and time. He appears well-developed and well-nourished. No distress.  HENT:  Head: Normocephalic and atraumatic.  Right Ear: External ear normal.  Left Ear: External ear normal.  Mouth/Throat: Oropharynx is clear and moist.  Eyes: Conjunctivae and EOM are normal. Pupils are equal, round, and reactive to light.  Neck: Normal range of motion. Neck supple. No thyromegaly present.  Cardiovascular: Normal rate, regular rhythm and normal heart sounds.   No murmur heard. Pulmonary/Chest: No respiratory distress. He has no wheezes. He has no rales.  Abdominal: Soft. Bowel sounds are normal. He exhibits no distension and no mass. There is no tenderness. There is no rebound and no guarding.  Musculoskeletal: He exhibits no edema.  Lymphadenopathy:    He has no cervical adenopathy.  Neurological: He is alert and oriented to person, place, and time. He displays normal reflexes. No cranial nerve deficit.  Skin: No rash noted.  He has fairly thickened callus lateral distal aspect of both feet. No ulceration.  Psychiatric: He has a normal mood and affect.          Assessment & Plan:  Physical exam. Labs reviewed. No major concerns. Preventative things like immunizations are up-to-date. He does have callus on both feet and he will return later to have this trimmed. Routine follow-up in 3 months. Refills on all medications were given today

## 2015-06-17 ENCOUNTER — Ambulatory Visit (INDEPENDENT_AMBULATORY_CARE_PROVIDER_SITE_OTHER): Payer: Medicare Other | Admitting: Family Medicine

## 2015-06-17 ENCOUNTER — Encounter: Payer: Self-pay | Admitting: Family Medicine

## 2015-06-17 VITALS — BP 110/80 | HR 75 | Temp 98.7°F | Ht 67.25 in | Wt 178.1 lb

## 2015-06-17 DIAGNOSIS — I10 Essential (primary) hypertension: Secondary | ICD-10-CM

## 2015-06-17 DIAGNOSIS — E785 Hyperlipidemia, unspecified: Secondary | ICD-10-CM

## 2015-06-17 DIAGNOSIS — E119 Type 2 diabetes mellitus without complications: Secondary | ICD-10-CM | POA: Diagnosis not present

## 2015-06-17 DIAGNOSIS — L84 Corns and callosities: Secondary | ICD-10-CM

## 2015-06-17 LAB — POCT GLYCOSYLATED HEMOGLOBIN (HGB A1C): HEMOGLOBIN A1C: 6.6

## 2015-06-17 NOTE — Progress Notes (Signed)
Pre visit review using our clinic tool,if applicable. No additional management support is needed unless otherwise documented below in the visit note.  

## 2015-06-17 NOTE — Progress Notes (Signed)
   Subjective:    Patient ID: Kevin Wise, male    DOB: 1935-04-24, 80 y.o.   MRN: [id number]  HPI Patient seen for medical follow-up Chronic problems include hypertension, dyslipidemia, type 2 diabetes. Last A1c 6.9%. Fasting blood sugars consistently less than 130. No polyuria or polydipsia.  Blood pressure well controlled with Accuretic. No dizziness. No chest pains. Walks for exercise 3 days per week.  Dyslipidemia treated with Lipitor. Lipids were reviewed from January and stable.  Callused area right lateral foot. Painful with walking. No alleviating factors.  Past Medical History  Diagnosis Date  . HYPOTHYROIDISM 01/10/2008  . VITAMIN D DEFICIENCY 01/10/2008  . HYPERLIPIDEMIA 01/06/2007  . ANEMIA 01/10/2008  . ERECTILE DYSFUNCTION 01/06/2007  . HYPERTENSION, BENIGN ESSENTIAL 01/06/2007  . CAROTID ARTERY STENOSIS, RIGHT 01/10/2009  . Rosacea 01/06/2007  . ARTHRITIS 01/06/2007  . HYPERGLYCEMIA, BORDERLINE 12/02/2009  . Other symptoms involving cardiovascular system 01/10/2009  . UNS ADVRS EFF OTH RX MEDICINAL&BIOLOGICAL SBSTNC 01/10/2008  . LIVER FUNCTION TESTS, ABNORMAL, HX OF 12/02/2009   Past Surgical History  Procedure Laterality Date  . Hernia repair      bilateral inguinal   . Tonsillectomy      reports that he has never smoked. He does not have any smokeless tobacco history on file. His alcohol and drug histories are not on file. family history includes Diabetes in his mother; Hyperlipidemia in his mother. No Known Allergies    Review of Systems  Constitutional: Negative for fatigue.  Eyes: Negative for visual disturbance.  Respiratory: Negative for cough, chest tightness and shortness of breath.   Cardiovascular: Negative for chest pain, palpitations and leg swelling.  Endocrine: Negative for polydipsia and polyuria.  Neurological: Negative for dizziness, syncope, weakness, light-headedness and headaches.       Objective:   Physical Exam    Constitutional: He is oriented to person, place, and time. He appears well-developed and well-nourished.  HENT:  Right Ear: External ear normal.  Left Ear: External ear normal.  Mouth/Throat: Oropharynx is clear and moist.  Eyes: Pupils are equal, round, and reactive to light.  Neck: Neck supple. No thyromegaly present.  Cardiovascular: Normal rate and regular rhythm.   Pulmonary/Chest: Effort normal and breath sounds normal. No respiratory distress. He has no wheezes. He has no rales.  Musculoskeletal: He exhibits no edema.  Neurological: He is alert and oriented to person, place, and time.  Skin:  Patient has a callus both the right and left feet laterally and distally. No evidence for ulceration          Assessment & Plan:  #1 type 2 diabetes. History of good control. Recheck A1c. If stable, six-month follow-up  #2 hypertension stable and at goal  #3 dyslipidemia. Continue Lipitor  #4 callus involving both feet. No ulceration. We trimmed the right callus and patient did notice some symptomatic relief. He is given option of regular follow-up with podiatry but declines at this time  Kristian Covey MD  [facility]

## 2015-08-23 ENCOUNTER — Telehealth: Payer: Self-pay | Admitting: Family Medicine

## 2015-08-23 MED ORDER — IBUPROFEN 800 MG PO TABS: 800.0000 mg | ORAL_TABLET | Freq: Three times a day (TID) | ORAL | Status: DC | PRN

## 2015-08-23 NOTE — Telephone Encounter (Signed)
Last refill #60, 2 rf Pending appt 12/16/2015 Last seen for f/u on 06/17/2015 Okay for refill?

## 2015-08-23 NOTE — Telephone Encounter (Signed)
Medication sent into the pharmacy. 

## 2015-08-23 NOTE — Telephone Encounter (Signed)
Pt need new Rx for ibuprofen 800 mg    Pharm:  [facility] [place]   [phone].

## 2015-08-23 NOTE — Telephone Encounter (Signed)
Refill once.  Avoid regular use, given his age.

## 2015-10-14 ENCOUNTER — Other Ambulatory Visit: Payer: Self-pay | Admitting: Family Medicine

## 2015-10-16 NOTE — Telephone Encounter (Signed)
Rx refill sent to pharmacy. 

## 2015-12-09 ENCOUNTER — Encounter: Payer: Self-pay | Admitting: Family Medicine

## 2015-12-09 ENCOUNTER — Ambulatory Visit (INDEPENDENT_AMBULATORY_CARE_PROVIDER_SITE_OTHER): Payer: Medicare Other | Admitting: Family Medicine

## 2015-12-09 VITALS — BP 120/84 | HR 80 | Temp 98.1°F | Ht 67.25 in | Wt 175.1 lb

## 2015-12-09 DIAGNOSIS — E119 Type 2 diabetes mellitus without complications: Secondary | ICD-10-CM | POA: Diagnosis not present

## 2015-12-09 DIAGNOSIS — K644 Residual hemorrhoidal skin tags: Secondary | ICD-10-CM

## 2015-12-09 DIAGNOSIS — Z23 Encounter for immunization: Secondary | ICD-10-CM | POA: Diagnosis not present

## 2015-12-09 DIAGNOSIS — I10 Essential (primary) hypertension: Secondary | ICD-10-CM | POA: Diagnosis not present

## 2015-12-09 DIAGNOSIS — E785 Hyperlipidemia, unspecified: Secondary | ICD-10-CM

## 2015-12-09 DIAGNOSIS — L84 Corns and callosities: Secondary | ICD-10-CM

## 2015-12-09 LAB — POCT GLYCOSYLATED HEMOGLOBIN (HGB A1C): HEMOGLOBIN A1C: 6.3

## 2015-12-09 MED ORDER — LIDOCAINE-HYDROCORTISONE ACE 3-0.5 % RE CREA: 1.0000 | TOPICAL_CREAM | Freq: Two times a day (BID) | RECTAL | 2 refills | Status: DC

## 2015-12-09 NOTE — Progress Notes (Signed)
Pre visit review using our clinic review tool, if applicable. No additional management support is needed unless otherwise documented below in the visit note. 

## 2015-12-09 NOTE — Progress Notes (Signed)
Subjective:     Patient ID: Kevin Wise, male   DOB: 1935/04/01, 80 y.o.   MRN: [id number]  HPI Patient seen for medical follow-up and also requesting callus trimming on both feet. He has history of calluses on the lateral aspect of the right and left foot. These have been treated previously. They're starting become very painful with ambulation.  Hypertension treated with quinapril HCTZ. Blood pressure stable. No dizziness. No headaches. No chest pains. History of well-controlled type 2 diabetes and remains on metformin. Fasting blood sugars usually range between 120 and 140. Hyperlipidemia with atorvastatin. No myalgias. No chest pains. Compliant with medications. Needs flu vaccine.  History of external hemorrhoids. Requesting refill of lidocaine/hydrocortisone cream  No recent falls. Denies depression symptoms.  Past Medical History:  Diagnosis Date  . ANEMIA 01/10/2008  . ARTHRITIS 01/06/2007  . CAROTID ARTERY STENOSIS, RIGHT 01/10/2009  . ERECTILE DYSFUNCTION 01/06/2007  . HYPERGLYCEMIA, BORDERLINE 12/02/2009  . HYPERLIPIDEMIA 01/06/2007  . HYPERTENSION, BENIGN ESSENTIAL 01/06/2007  . HYPOTHYROIDISM 01/10/2008  . LIVER FUNCTION TESTS, ABNORMAL, HX OF 12/02/2009  . Other symptoms involving cardiovascular system 01/10/2009  . Rosacea 01/06/2007  . UNS ADVRS EFF OTH RX MEDICINAL&BIOLOGICAL SBSTNC 01/10/2008  . VITAMIN D DEFICIENCY 01/10/2008   Past Surgical History:  Procedure Laterality Date  . HERNIA REPAIR     bilateral inguinal   . TONSILLECTOMY      reports that he has never smoked. He has never used smokeless tobacco. His alcohol and drug histories are not on file. family history includes Diabetes in his mother; Hyperlipidemia in his mother. No Known Allergies   Review of Systems  Constitutional: Negative for fatigue and unexpected weight change.  Eyes: Negative for visual disturbance.  Respiratory: Negative for cough, chest tightness and shortness of breath.    Cardiovascular: Negative for chest pain, palpitations and leg swelling.  Endocrine: Negative for polydipsia and polyuria.  Neurological: Negative for dizziness, syncope, weakness, light-headedness and headaches.       Objective:   Physical Exam  Constitutional: He is oriented to person, place, and time. He appears well-developed and well-nourished.  HENT:  Right Ear: External ear normal.  Left Ear: External ear normal.  Mouth/Throat: Oropharynx is clear and moist.  Eyes: Pupils are equal, round, and reactive to light.  Neck: Neck supple. No thyromegaly present.  Cardiovascular: Normal rate and regular rhythm.   Pulmonary/Chest: Effort normal and breath sounds normal. No respiratory distress. He has no wheezes. He has no rales.  Musculoskeletal: He exhibits no edema.  Neurological: He is alert and oriented to person, place, and time.  Skin:  Thick callus along the lateral aspect of both feet distally. No evidence for ulceration       Assessment:     #1 history of well-controlled type 2 diabetes  #2 hypertension stable and at goal  #3 hyperlipidemia  #4 foot callus right and left feet  #5 history of external hemorrhoids    Plan:     -Trimmed foot callus right and left feet with #15 blade. Patient noticed some immediate improvement afterwards -Repeat hemoglobin A1c -Flu vaccine given -Routine follow-up in 3 months for complete physical -Refill lidocaine/hydrocortisone cream for external use as needed for hemorrhoids  Kristian Covey MD [place] [facility] at [facility]

## 2015-12-16 ENCOUNTER — Ambulatory Visit: Payer: Medicare Other | Admitting: Family Medicine

## 2016-03-16 ENCOUNTER — Other Ambulatory Visit: Payer: Medicare Other

## 2016-03-16 LAB — HM DIABETES EYE EXAM

## 2016-03-19 ENCOUNTER — Encounter: Payer: Self-pay | Admitting: Family Medicine

## 2016-03-21 ENCOUNTER — Other Ambulatory Visit: Payer: Self-pay | Admitting: Family Medicine

## 2016-03-23 ENCOUNTER — Encounter: Payer: Medicare Other | Admitting: Family Medicine

## 2016-03-30 ENCOUNTER — Encounter: Payer: Medicare Other | Admitting: Family Medicine

## 2016-03-30 ENCOUNTER — Other Ambulatory Visit (INDEPENDENT_AMBULATORY_CARE_PROVIDER_SITE_OTHER): Payer: Medicare Other

## 2016-03-30 DIAGNOSIS — Z0000 Encounter for general adult medical examination without abnormal findings: Secondary | ICD-10-CM | POA: Diagnosis not present

## 2016-03-30 LAB — HEPATIC FUNCTION PANEL
ALBUMIN: 4.4 g/dL (ref 3.5–5.2)
ALK PHOS: 49 U/L (ref 39–117)
ALT: 25 U/L (ref 0–53)
AST: 20 U/L (ref 0–37)
BILIRUBIN TOTAL: 0.6 mg/dL (ref 0.2–1.2)
Bilirubin, Direct: 0.1 mg/dL (ref 0.0–0.3)
Total Protein: 7.1 g/dL (ref 6.0–8.3)

## 2016-03-30 LAB — PSA: PSA: 1.01 ng/mL (ref 0.10–4.00)

## 2016-03-30 LAB — BASIC METABOLIC PANEL
BUN: 20 mg/dL (ref 6–23)
CALCIUM: 9.6 mg/dL (ref 8.4–10.5)
CO2: 28 mEq/L (ref 19–32)
CREATININE: 1.26 mg/dL (ref 0.40–1.50)
Chloride: 105 mEq/L (ref 96–112)
GFR: 71.14 mL/min (ref >60.00)
GLUCOSE: 111 mg/dL — AB (ref 70–99)
Potassium: 4.3 mEq/L (ref 3.5–5.1)
Sodium: 140 mEq/L (ref 135–145)

## 2016-03-30 LAB — CBC WITH DIFFERENTIAL/PLATELET
BASOS ABS: 0.1 10*3/uL (ref 0.0–0.1)
Basophils Relative: 0.6 % (ref 0.0–3.0)
EOS ABS: 0.2 10*3/uL (ref 0.0–0.7)
EOS PCT: 2.5 % (ref 0.0–5.0)
HCT: 39.8 % (ref 39.0–52.0)
Hemoglobin: 13.3 g/dL (ref 13.0–17.0)
LYMPHS ABS: 3 10*3/uL (ref 0.7–4.0)
Lymphocytes Relative: 38.5 % (ref 12.0–46.0)
MCHC: 33.4 g/dL (ref 30.0–36.0)
MCV: 92.4 fl (ref 78.0–100.0)
MONO ABS: 0.6 10*3/uL (ref 0.1–1.0)
Monocytes Relative: 7.2 % (ref 3.0–12.0)
NEUTROS PCT: 51.2 % (ref 43.0–77.0)
Neutro Abs: 4 10*3/uL (ref 1.4–7.7)
Platelets: 258 10*3/uL (ref 150.0–400.0)
RBC: 4.31 Mil/uL (ref 4.22–5.81)
RDW: 13.5 % (ref 11.5–15.5)
WBC: 7.9 10*3/uL (ref 4.0–10.5)

## 2016-03-30 LAB — MICROALBUMIN / CREATININE URINE RATIO
CREATININE, U: 176.3 mg/dL
MICROALB/CREAT RATIO: 3.5 mg/g (ref 0.0–30.0)
Microalb, Ur: 6.2 mg/dL — ABNORMAL HIGH (ref 0.0–1.9)

## 2016-03-30 LAB — LIPID PANEL
CHOLESTEROL: 118 mg/dL (ref 0–200)
HDL: 38.8 mg/dL — AB (ref >39.00)
LDL CALC: 61 mg/dL (ref 0–99)
NonHDL: 78.78
TRIGLYCERIDES: 90 mg/dL (ref 0.0–149.0)
Total CHOL/HDL Ratio: 3
VLDL: 18 mg/dL (ref 0.0–40.0)

## 2016-03-30 LAB — TSH: TSH: 0.93 u[IU]/mL (ref 0.35–4.50)

## 2016-03-30 LAB — HEMOGLOBIN A1C: HEMOGLOBIN A1C: 7 % — AB (ref 4.6–6.5)

## 2016-04-03 ENCOUNTER — Other Ambulatory Visit: Payer: Self-pay | Admitting: Family Medicine

## 2016-04-06 ENCOUNTER — Ambulatory Visit (INDEPENDENT_AMBULATORY_CARE_PROVIDER_SITE_OTHER): Payer: Medicare Other | Admitting: Family Medicine

## 2016-04-06 ENCOUNTER — Encounter: Payer: Self-pay | Admitting: Family Medicine

## 2016-04-06 VITALS — BP 100/70 | HR 85 | Ht 67.25 in | Wt 177.6 lb

## 2016-04-06 DIAGNOSIS — Z0000 Encounter for general adult medical examination without abnormal findings: Secondary | ICD-10-CM

## 2016-04-06 MED ORDER — QUINAPRIL-HYDROCHLOROTHIAZIDE 20-12.5 MG PO TABS: 1.0000 | ORAL_TABLET | Freq: Two times a day (BID) | ORAL | 3 refills | Status: DC

## 2016-04-06 MED ORDER — FLUTICASONE PROPIONATE 50 MCG/ACT NA SUSP: NASAL | 1 refills | Status: DC

## 2016-04-06 MED ORDER — METFORMIN HCL 850 MG PO TABS: ORAL_TABLET | ORAL | 2 refills | Status: DC

## 2016-04-06 MED ORDER — ATORVASTATIN CALCIUM 40 MG PO TABS
40.0000 mg | ORAL_TABLET | Freq: Every day | ORAL | 2 refills | Status: DC
Start: 2016-04-06 — End: 2016-12-23

## 2016-04-06 MED ORDER — GLUCOSE BLOOD VI STRP: ORAL_STRIP | 2 refills | Status: DC

## 2016-04-06 MED ORDER — TRAMADOL HCL 50 MG PO TABS: ORAL_TABLET | ORAL | 0 refills | Status: DC

## 2016-04-06 MED ORDER — ONETOUCH LANCETS MISC: 2 refills | Status: DC

## 2016-04-06 MED ORDER — LIDOCAINE-HYDROCORTISONE ACE 3-0.5 % RE CREA: 1.0000 | TOPICAL_CREAM | Freq: Two times a day (BID) | RECTAL | 2 refills | Status: DC

## 2016-04-06 NOTE — Progress Notes (Signed)
Subjective:     Patient ID: Kevin Wise, male   DOB: [birth year], 81 y.o.   MRN: [id number]  HPI Patient is here for physical exam. His chronic problems include history of type 2 diabetes, hyperlipidemia, hypertension. He still exercises several days per week. No recent chest pains. No dyspnea. Poor compliance with diet recently. Blood sugars relatively stable. Needs refills of several medications today.  Immunizations up-to-date. Colonoscopy up-to-date  Past Medical History:  Diagnosis Date  . ANEMIA 01/10/2008  . ARTHRITIS 01/06/2007  . CAROTID ARTERY STENOSIS, RIGHT 01/10/2009  . ERECTILE DYSFUNCTION 01/06/2007  . HYPERGLYCEMIA, BORDERLINE 12/02/2009  . HYPERLIPIDEMIA 01/06/2007  . HYPERTENSION, BENIGN ESSENTIAL 01/06/2007  . HYPOTHYROIDISM 01/10/2008  . LIVER FUNCTION TESTS, ABNORMAL, HX OF 12/02/2009  . Other symptoms involving cardiovascular system 01/10/2009  . Rosacea 01/06/2007  . UNS ADVRS EFF OTH RX MEDICINAL&BIOLOGICAL SBSTNC 01/10/2008  . VITAMIN D DEFICIENCY 01/10/2008   Past Surgical History:  Procedure Laterality Date  . HERNIA REPAIR     bilateral inguinal   . TONSILLECTOMY      reports that he has never smoked. He has never used smokeless tobacco. He reports that he does not use drugs. His alcohol history is not on file. family history includes Diabetes in his mother; Hyperlipidemia in his mother. No Known Allergies   Review of Systems  Constitutional: Negative for activity change, appetite change, fatigue and fever.  HENT: Negative for congestion, ear pain and trouble swallowing.   Eyes: Negative for pain and visual disturbance.  Respiratory: Negative for cough, shortness of breath and wheezing.   Cardiovascular: Negative for chest pain and palpitations.  Gastrointestinal: Negative for abdominal distention, abdominal pain, blood in stool, constipation, diarrhea, nausea, rectal pain and vomiting.  Genitourinary: Negative for dysuria, hematuria and  testicular pain.  Musculoskeletal: Negative for arthralgias and joint swelling.  Skin: Negative for rash.  Neurological: Negative for dizziness, syncope and headaches.  Hematological: Negative for adenopathy.  Psychiatric/Behavioral: Negative for confusion and dysphoric mood.       Objective:   Physical Exam  Constitutional: He is oriented to person, place, and time. He appears well-developed and well-nourished. No distress.  HENT:  Head: Normocephalic and atraumatic.  Right Ear: External ear normal.  Left Ear: External ear normal.  Mouth/Throat: Oropharynx is clear and moist.  Eyes: Conjunctivae and EOM are normal. Pupils are equal, round, and reactive to light.  Neck: Normal range of motion. Neck supple. No thyromegaly present.  Cardiovascular: Normal rate, regular rhythm and normal heart sounds.   No murmur heard. Pulmonary/Chest: No respiratory distress. He has no wheezes. He has no rales.  Abdominal: Soft. Bowel sounds are normal. He exhibits no distension and no mass. There is no tenderness. There is no rebound and no guarding.  Musculoskeletal: He exhibits no edema.  Lymphadenopathy:    He has no cervical adenopathy.  Neurological: He is alert and oriented to person, place, and time. He displays normal reflexes. No cranial nerve deficit.  Skin: No rash noted.  He has a hardened callus left lateral foot distally but no ulceration. Normal sensory function.  Psychiatric: He has a normal mood and affect.       Assessment:     Physical exam. Labs reviewed with no major concerns    Plan:     -Continue with yearly flu vaccine -Foot care discussed. Consider daily use of Vaseline to left lateral foot callus. Consider trimming if this gets any thicker -Discussed pros and cons of PSA testing in the  future  Kristian Covey MD [facility] at [facility]

## 2016-04-06 NOTE — Patient Instructions (Signed)
Consider vaseline to right foot callus.   Let's plan on 3 month follow up.

## 2016-04-06 NOTE — Progress Notes (Signed)
Pre visit review using our clinic review tool, if applicable. No additional management support is needed unless otherwise documented below in the visit note. 

## 2016-05-19 ENCOUNTER — Ambulatory Visit (INDEPENDENT_AMBULATORY_CARE_PROVIDER_SITE_OTHER): Payer: Medicare Other | Admitting: Family Medicine

## 2016-05-19 ENCOUNTER — Encounter: Payer: Self-pay | Admitting: Family Medicine

## 2016-05-19 ENCOUNTER — Other Ambulatory Visit: Payer: Self-pay | Admitting: Family Medicine

## 2016-05-19 VITALS — BP 110/70 | HR 65 | Temp 97.8°F | Wt 178.8 lb

## 2016-05-19 DIAGNOSIS — R1031 Right lower quadrant pain: Secondary | ICD-10-CM

## 2016-05-19 DIAGNOSIS — K4091 Unilateral inguinal hernia, without obstruction or gangrene, recurrent: Secondary | ICD-10-CM | POA: Diagnosis not present

## 2016-05-19 LAB — POCT URINALYSIS DIPSTICK
Bilirubin, UA: NEGATIVE
Blood, UA: NEGATIVE
Glucose, UA: NEGATIVE
Ketones, UA: NEGATIVE
LEUKOCYTES UA: NEGATIVE
Nitrite, UA: NEGATIVE
PROTEIN UA: NEGATIVE
Spec Grav, UA: 1.015 (ref 1.030–1.035)
UROBILINOGEN UA: 0.2 (ref ?–2.0)
pH, UA: 6.5 (ref 5.0–8.0)

## 2016-05-19 NOTE — Progress Notes (Signed)
Subjective:     Patient ID: Kevin Wise, male   DOB: May 28, 1935, 81 y.o.   MRN: [id number]  HPI  Patient seen with pain right lower abdomen and pelvic area for at least couple months now. Denies any injury. Pain has been relatively mild- dull ache somewhat intermittent 4 out of 10 severity. Good appetite. No weight changes. He had colonoscopy 2012. He's noted that he sometimes has pain while urinating but denies any burning with urination.  No gross hematuria. He had PSA back in February which was normal.  Denies any exacerbating or alleviating factors. Occasional night sweats but no weight change. He does have history of bilateral hernia repair which was done without mesh years ago. He has not noted any recent bulge but does have some pain down in the lower region near site of previous hernia repair.  Past Medical History:  Diagnosis Date  . ANEMIA 01/10/2008  . ARTHRITIS 01/06/2007  . CAROTID ARTERY STENOSIS, RIGHT 01/10/2009  . ERECTILE DYSFUNCTION 01/06/2007  . HYPERGLYCEMIA, BORDERLINE 12/02/2009  . HYPERLIPIDEMIA 01/06/2007  . HYPERTENSION, BENIGN ESSENTIAL 01/06/2007  . HYPOTHYROIDISM 01/10/2008  . LIVER FUNCTION TESTS, ABNORMAL, HX OF 12/02/2009  . Other symptoms involving cardiovascular system 01/10/2009  . Rosacea 01/06/2007  . UNS ADVRS EFF OTH RX MEDICINAL&BIOLOGICAL SBSTNC 01/10/2008  . VITAMIN D DEFICIENCY 01/10/2008   Past Surgical History:  Procedure Laterality Date  . HERNIA REPAIR     bilateral inguinal   . TONSILLECTOMY      reports that he has never smoked. He has never used smokeless tobacco. He reports that he does not use drugs. His alcohol history is not on file. family history includes Diabetes in his mother; Hyperlipidemia in his mother. No Known Allergies  Review of Systems  Constitutional: Negative for appetite change, chills, fever and unexpected weight change.  Respiratory: Negative for shortness of breath.   Cardiovascular: Negative for chest  pain, palpitations and leg swelling.  Gastrointestinal: Negative for blood in stool, diarrhea, nausea and vomiting.  Genitourinary: Negative for hematuria.  Musculoskeletal: Negative for back pain.       Objective:   Physical Exam  Constitutional: He appears well-developed and well-nourished. No distress.  Cardiovascular: Normal rate and regular rhythm.   Pulmonary/Chest: Effort normal and breath sounds normal. No respiratory distress. He has no wheezes. He has no rales.  Abdominal: Soft. Bowel sounds are normal. He exhibits no distension and no mass. There is no tenderness. There is no rebound and no guarding.  Patient has slight bulge right inguinal region compared to the left       Assessment:     Right-sided abdominal/pelvic pain. Patient does not have any red flags such as appetite or weight change or any fever. Recent lab work unremarkable. Suspect this might be related to early right inguinal hernia recurrence with remote history of non-mesh related repair years ago    Plan:     -Check urine dipstick=normal.  -Consider CT abdomen and pelvis to further assess given duration of symptoms -Consider general surgery consult if symptoms persist -Follow-up immediately for any fever, stool changes, or progressive pain  Kristian Covey MD [place] [facility] at [facility]

## 2016-05-19 NOTE — Progress Notes (Signed)
Pre visit review using our clinic review tool, if applicable. No additional management support is needed unless otherwise documented below in the visit note. 

## 2016-05-19 NOTE — Patient Instructions (Signed)

## 2016-05-21 ENCOUNTER — Other Ambulatory Visit (INDEPENDENT_AMBULATORY_CARE_PROVIDER_SITE_OTHER): Payer: Medicare Other

## 2016-05-21 DIAGNOSIS — R1031 Right lower quadrant pain: Secondary | ICD-10-CM

## 2016-05-21 LAB — BASIC METABOLIC PANEL
BUN: 20 mg/dL (ref 6–23)
CALCIUM: 9.5 mg/dL (ref 8.4–10.5)
CO2: 28 mEq/L (ref 19–32)
CREATININE: 1.15 mg/dL (ref 0.40–1.50)
Chloride: 104 mEq/L (ref 96–112)
GFR: 79.02 mL/min (ref >60.00)
Glucose, Bld: 114 mg/dL — ABNORMAL HIGH (ref 70–99)
Potassium: 4.3 mEq/L (ref 3.5–5.1)
Sodium: 140 mEq/L (ref 135–145)

## 2016-05-26 ENCOUNTER — Ambulatory Visit (INDEPENDENT_AMBULATORY_CARE_PROVIDER_SITE_OTHER)
Admission: RE | Admit: 2016-05-26 | Discharge: 2016-05-26 | Disposition: A | Discharge status: Home / Self Care | Payer: Medicare Other | Source: Ambulatory Visit | Attending: Family Medicine | Admitting: Family Medicine

## 2016-05-26 DIAGNOSIS — R1031 Right lower quadrant pain: Secondary | ICD-10-CM | POA: Diagnosis not present

## 2016-05-26 HISTORY — DX: Type 2 diabetes mellitus without complications: E11.9

## 2016-05-27 ENCOUNTER — Telehealth: Payer: Self-pay | Admitting: Family Medicine

## 2016-05-27 ENCOUNTER — Inpatient Hospital Stay: Admission: RE | Admit: 2016-05-27 | Payer: Medicare Other | Source: Ambulatory Visit

## 2016-05-27 NOTE — Telephone Encounter (Signed)
Kevin Wise pt returned your call and state he will be there all day.

## 2016-05-28 ENCOUNTER — Telehealth: Payer: Self-pay | Admitting: Family Medicine

## 2016-05-28 NOTE — Telephone Encounter (Signed)
Patient called back and asked there were anything he should or should not do in regards to the hernia and do these change in size or what?  I advised the pt Dr [name] did not state this in his results and our office does not treat a hernia as this is the reason he recommended a referral.  Patient stated he will call back with a specialist name once he finds one.

## 2016-05-28 NOTE — Telephone Encounter (Signed)
See results note. 

## 2016-05-28 NOTE — Telephone Encounter (Signed)
Left a message at the cell number for the pt to return my call.

## 2016-05-28 NOTE — Telephone Encounter (Signed)
Pt states he has a couple of questions concerning his hernia.  Would like you to call him at the home number, and if more than 30 min, please call on the cell phone. Thank you

## 2016-06-02 ENCOUNTER — Telehealth: Payer: Self-pay | Admitting: Family Medicine

## 2016-06-02 DIAGNOSIS — K469 Unspecified abdominal hernia without obstruction or gangrene: Secondary | ICD-10-CM

## 2016-06-02 NOTE — Telephone Encounter (Signed)
Pt needs a referral to see dr Conan Bowens teppara phone [phone] fax # [phone]. Address [address] [place]  [address] attn donna. Pt has hernia

## 2016-06-02 NOTE — Telephone Encounter (Signed)
OK 

## 2016-06-02 NOTE — Telephone Encounter (Signed)
Okay to refer? 

## 2016-06-03 NOTE — Telephone Encounter (Signed)
Pt following up on request for referral to surgeon. thanks

## 2016-06-03 NOTE — Telephone Encounter (Signed)
Referral placed.

## 2016-07-06 ENCOUNTER — Encounter: Payer: Self-pay | Admitting: Family Medicine

## 2016-07-06 ENCOUNTER — Ambulatory Visit (INDEPENDENT_AMBULATORY_CARE_PROVIDER_SITE_OTHER): Payer: Medicare Other | Admitting: Family Medicine

## 2016-07-06 DIAGNOSIS — E119 Type 2 diabetes mellitus without complications: Secondary | ICD-10-CM

## 2016-07-06 LAB — POCT GLYCOSYLATED HEMOGLOBIN (HGB A1C): HEMOGLOBIN A1C: 6.7

## 2016-07-06 NOTE — Progress Notes (Signed)
Subjective:     Patient ID: Kevin Wise, male   DOB: [birth year], 81 y.o.   MRN: [id number]  HPI Patient seen for medical follow-up. Hitting having some right lower abdominal pelvic pain for couple months and CT scan confirmed right inguinal hernia containing some fat and a knuckle of the bladder. He has seen surgeon and is scheduled for robotic surgery for next month.  Type 2 diabetes. History of good control. Last A1c 7.0%. Remains on metformin. He has tightened diet up a bit since last visit. Stays very active with exercise. Compliant with all medications.  Past Medical History:  Diagnosis Date  . ANEMIA 01/10/2008  . ARTHRITIS 01/06/2007  . CAROTID ARTERY STENOSIS, RIGHT 01/10/2009  . Diabetes mellitus without complication (HCC)   . ERECTILE DYSFUNCTION 01/06/2007  . HYPERGLYCEMIA, BORDERLINE 12/02/2009  . HYPERLIPIDEMIA 01/06/2007  . HYPERTENSION, BENIGN ESSENTIAL 01/06/2007  . HYPOTHYROIDISM 01/10/2008  . LIVER FUNCTION TESTS, ABNORMAL, HX OF 12/02/2009  . Other symptoms involving cardiovascular system 01/10/2009  . Rosacea 01/06/2007  . UNS ADVRS EFF OTH RX MEDICINAL&BIOLOGICAL SBSTNC 01/10/2008  . VITAMIN D DEFICIENCY 01/10/2008   Past Surgical History:  Procedure Laterality Date  . HERNIA REPAIR     bilateral inguinal   . TONSILLECTOMY      reports that he has never smoked. He has never used smokeless tobacco. He reports that he does not use drugs. His alcohol history is not on file. family history includes Diabetes in his mother; Hyperlipidemia in his mother. No Known Allergies    Review of Systems  Constitutional: Negative for fatigue.  Eyes: Negative for visual disturbance.  Respiratory: Negative for cough, chest tightness and shortness of breath.   Cardiovascular: Negative for chest pain, palpitations and leg swelling.  Endocrine: Negative for polydipsia and polyuria.  Genitourinary: Negative for dysuria.  Neurological: Negative for dizziness, syncope,  weakness, light-headedness and headaches.       Objective:   Physical Exam  Constitutional: He is oriented to person, place, and time. He appears well-developed and well-nourished.  HENT:  Right Ear: External ear normal.  Left Ear: External ear normal.  Mouth/Throat: Oropharynx is clear and moist.  Eyes: Pupils are equal, round, and reactive to light.  Neck: Neck supple. No thyromegaly present.  Cardiovascular: Normal rate and regular rhythm.   Pulmonary/Chest: Effort normal and breath sounds normal. No respiratory distress. He has no wheezes. He has no rales.  Musculoskeletal: He exhibits no edema.  Neurological: He is alert and oriented to person, place, and time.       Assessment:     Type 2 diabetes. Well controlled with repeat A1c today 6.7%     Plan:     -Continue healthy lifestyle habits  -Follow-up in 3 months and recheck A1c then  -Continue with yearly eye exam   Kristian Covey MD [facility] at [facility]

## 2016-08-24 ENCOUNTER — Telehealth: Payer: Self-pay | Admitting: Family Medicine

## 2016-08-24 MED ORDER — ZOSTER VAC RECOMB ADJUVANTED 50 MCG/0.5ML IM SUSR: 0.5000 mL | Freq: Once | INTRAMUSCULAR | 1 refills | Status: AC

## 2016-08-24 NOTE — Telephone Encounter (Signed)
Rx sent and patient is aware. 

## 2016-08-24 NOTE — Telephone Encounter (Signed)
Pt would like to know if it is okay for him to take the new shingle vaccine if so he would like for it to be called in to [facility] on [address].

## 2016-08-24 NOTE — Telephone Encounter (Signed)
OK- shingrix 0.5 mg IM and repeat in 2 months.

## 2016-10-05 ENCOUNTER — Ambulatory Visit (INDEPENDENT_AMBULATORY_CARE_PROVIDER_SITE_OTHER): Payer: Medicare Other | Admitting: Family Medicine

## 2016-10-05 ENCOUNTER — Encounter: Payer: Self-pay | Admitting: Family Medicine

## 2016-10-05 ENCOUNTER — Ambulatory Visit: Payer: Medicare Other | Admitting: Family Medicine

## 2016-10-05 VITALS — BP 118/70 | HR 57 | Temp 98.6°F | Wt 177.7 lb

## 2016-10-05 DIAGNOSIS — M79642 Pain in left hand: Secondary | ICD-10-CM

## 2016-10-05 DIAGNOSIS — E119 Type 2 diabetes mellitus without complications: Secondary | ICD-10-CM | POA: Diagnosis not present

## 2016-10-05 DIAGNOSIS — I10 Essential (primary) hypertension: Secondary | ICD-10-CM | POA: Diagnosis not present

## 2016-10-05 DIAGNOSIS — M79641 Pain in right hand: Secondary | ICD-10-CM | POA: Diagnosis not present

## 2016-10-05 DIAGNOSIS — E785 Hyperlipidemia, unspecified: Secondary | ICD-10-CM | POA: Diagnosis not present

## 2016-10-05 LAB — VITAMIN B12: VITAMIN B 12: 254 pg/mL (ref 211–911)

## 2016-10-05 LAB — POCT GLYCOSYLATED HEMOGLOBIN (HGB A1C): HEMOGLOBIN A1C: 6.4

## 2016-10-05 NOTE — Patient Instructions (Signed)
Consider night time wrist splints.

## 2016-10-05 NOTE — Progress Notes (Signed)
Subjective:     Patient ID: Kevin Wise, male   DOB: 03-Sep-1935, 81 y.o.   MRN: [id number]  HPI Patient seen for medical follow-up. He had recent robotic bilateral inguinal hernia repair at [facility] and that has gone very well. He is pleased with the results.  Type 2 diabetes. A1c today 6.4%. Compliant with diet. No hypoglycemia. Takes metformin. Other medications reviewed. He is on atorvastatin for hyperlipidemia. No myalgias. Blood pressure stable. No dizziness. No chest pains.  Patient received new shingles vaccine last month and plans to get follow-up in one month  New problem of bilateral hand pain especially at night. Mostly involving first 3 digits and predominantly middle finger of both hands. He describes this as achy type pain which is worse at night. No numbness. No burning sensation. No lower extremity symptoms.  Past Medical History:  Diagnosis Date  . ANEMIA 01/10/2008  . ARTHRITIS 01/06/2007  . CAROTID ARTERY STENOSIS, RIGHT 01/10/2009  . Diabetes mellitus without complication (HCC)   . ERECTILE DYSFUNCTION 01/06/2007  . HYPERGLYCEMIA, BORDERLINE 12/02/2009  . HYPERLIPIDEMIA 01/06/2007  . HYPERTENSION, BENIGN ESSENTIAL 01/06/2007  . HYPOTHYROIDISM 01/10/2008  . LIVER FUNCTION TESTS, ABNORMAL, HX OF 12/02/2009  . Other symptoms involving cardiovascular system 01/10/2009  . Rosacea 01/06/2007  . UNS ADVRS EFF OTH RX MEDICINAL&BIOLOGICAL SBSTNC 01/10/2008  . VITAMIN D DEFICIENCY 01/10/2008   Past Surgical History:  Procedure Laterality Date  . HERNIA REPAIR     bilateral inguinal   . TONSILLECTOMY      reports that he has never smoked. He has never used smokeless tobacco. He reports that he does not use drugs. His alcohol history is not on file. family history includes Diabetes in his mother; Hyperlipidemia in his mother. No Known Allergies   Review of Systems  Constitutional: Negative for fatigue.  Eyes: Negative for visual disturbance.  Respiratory:  Negative for cough, chest tightness and shortness of breath.   Cardiovascular: Negative for chest pain, palpitations and leg swelling.  Endocrine: Negative for polydipsia and polyuria.  Neurological: Negative for dizziness, syncope, weakness, light-headedness and headaches.       Objective:   Physical Exam  Constitutional: He is oriented to person, place, and time. He appears well-developed and well-nourished.  HENT:  Right Ear: External ear normal.  Left Ear: External ear normal.  Mouth/Throat: Oropharynx is clear and moist.  Eyes: Pupils are equal, round, and reactive to light.  Neck: Neck supple. No thyromegaly present.  Cardiovascular: Normal rate and regular rhythm.   Pulmonary/Chest: Effort normal and breath sounds normal. No respiratory distress. He has no wheezes. He has no rales.  Musculoskeletal: He exhibits no edema.  Neurological: He is alert and oriented to person, place, and time. He has normal reflexes.  Symmetric upper extremity reflexes with normal sensory function throughout       Assessment:     #1 hypertension stable and at goal  #2 type 2 diabetes well controlled with A1c today 6.4%  #3 dyslipidemia  #4 bilateral hand pain. Question carpal tunnel syndrome. Doubt neuropathy. Patient does take metformin and is at higher risk for B12 deficiency    Plan:     -Check B12 level -Consider nighttime wrist splints and touch base in a few weeks if not improving with that -Continue current medications -Routine follow-up in 3 months and sooner as needed  Kristian Covey MD [facility] at [facility]

## 2016-10-07 ENCOUNTER — Telehealth: Payer: Self-pay | Admitting: Family Medicine

## 2016-10-07 NOTE — Telephone Encounter (Signed)
Left message on machine for patient to return our call to review lab results

## 2016-10-07 NOTE — Telephone Encounter (Signed)
Kevin Wise pt returned your call and would like for you to call back on his cell phone.

## 2016-10-08 ENCOUNTER — Telehealth: Payer: Self-pay | Admitting: Family Medicine

## 2016-10-08 NOTE — Telephone Encounter (Signed)
See results note. 

## 2016-10-08 NOTE — Telephone Encounter (Signed)
° ° °  Pt said if you are not able to reach him just leave the message on his voice mail .

## 2016-11-13 ENCOUNTER — Ambulatory Visit (INDEPENDENT_AMBULATORY_CARE_PROVIDER_SITE_OTHER): Payer: Medicare Other | Admitting: Family Medicine

## 2016-11-13 ENCOUNTER — Encounter: Payer: Self-pay | Admitting: Family Medicine

## 2016-11-13 VITALS — BP 118/60 | HR 76 | Temp 98.2°F | Ht 67.25 in | Wt 179.3 lb

## 2016-11-13 DIAGNOSIS — R1031 Right lower quadrant pain: Secondary | ICD-10-CM

## 2016-11-13 DIAGNOSIS — R1032 Left lower quadrant pain: Secondary | ICD-10-CM

## 2016-11-13 NOTE — Progress Notes (Signed)
Subjective:     Patient ID: Kevin Wise, male   DOB: [birth year], 81 y.o.   MRN: [id number]  HPI Patient seen with bilateral lower abdominal pain with onset 2 days ago. This lasted for about a day and a half. His pain is currently resolved. He denies any recent constipation. No fevers or chills. No dysuria. He had colonoscopy 2012 with recommended 10 year follow-up. No clear exacerbating or alleviating factors. As above, pain resolved at this time. He had CT abdomen pelvis last April which showed no significant acute abnormalities. He had hernia which was repaired recently and uneventfully.  Past Medical History:  Diagnosis Date  . ANEMIA 01/10/2008  . ARTHRITIS 01/06/2007  . CAROTID ARTERY STENOSIS, RIGHT 01/10/2009  . Diabetes mellitus without complication (HCC)   . ERECTILE DYSFUNCTION 01/06/2007  . HYPERGLYCEMIA, BORDERLINE 12/02/2009  . HYPERLIPIDEMIA 01/06/2007  . HYPERTENSION, BENIGN ESSENTIAL 01/06/2007  . HYPOTHYROIDISM 01/10/2008  . LIVER FUNCTION TESTS, ABNORMAL, HX OF 12/02/2009  . Other symptoms involving cardiovascular system 01/10/2009  . Rosacea 01/06/2007  . UNS ADVRS EFF OTH RX MEDICINAL&BIOLOGICAL SBSTNC 01/10/2008  . VITAMIN D DEFICIENCY 01/10/2008   Past Surgical History:  Procedure Laterality Date  . HERNIA REPAIR     bilateral inguinal   . TONSILLECTOMY      reports that he has never smoked. He has never used smokeless tobacco. He reports that he does not use drugs. His alcohol history is not on file. family history includes Diabetes in his mother; Hyperlipidemia in his mother. No Known Allergies   Review of Systems  Constitutional: Negative for chills and fever.  Respiratory: Negative for shortness of breath.   Cardiovascular: Negative for chest pain.  Gastrointestinal: Negative for abdominal distention, blood in stool, constipation, diarrhea, nausea and vomiting.  Genitourinary: Negative for dysuria.       Objective:   Physical Exam   Constitutional: He appears well-developed and well-nourished.  Cardiovascular: Normal rate and regular rhythm.   Pulmonary/Chest: Effort normal and breath sounds normal. No respiratory distress. He has no wheezes. He has no rales.  Abdominal: Soft. Bowel sounds are normal. He exhibits no distension and no mass. There is no tenderness. There is no rebound and no guarding.       Assessment:     Transient abdominal pain earlier in the week currently resolved.  Denies any red flags such as appetite or weight change, fever, stool change, etc.    Plan:     -Recommend observation for now. Follow-up promptly for any fever, recurrent pain, or other concerns -we elected to not get any labs or x-rays at this time since pain resolved.    Kristian Covey MD [facility] at [facility]

## 2016-11-13 NOTE — Patient Instructions (Signed)

## 2016-11-18 ENCOUNTER — Other Ambulatory Visit: Payer: Self-pay | Admitting: *Deleted

## 2016-11-18 MED ORDER — METFORMIN HCL 850 MG PO TABS: ORAL_TABLET | ORAL | 2 refills | Status: DC

## 2016-12-08 ENCOUNTER — Telehealth: Payer: Self-pay | Admitting: *Deleted

## 2016-12-08 NOTE — Telephone Encounter (Signed)
He can get this anytime.  No need to wait.

## 2016-12-08 NOTE — Telephone Encounter (Signed)
Patient is aware and an appointment scheduled 

## 2016-12-08 NOTE — Telephone Encounter (Signed)
Patient called stating he had his 2nd shingles on 11/30/16 and patient would like to know how long does he need to wait to get a flu shot. Please advise

## 2016-12-11 ENCOUNTER — Ambulatory Visit: Payer: Self-pay | Admitting: Family Medicine

## 2016-12-11 ENCOUNTER — Ambulatory Visit (INDEPENDENT_AMBULATORY_CARE_PROVIDER_SITE_OTHER): Payer: Medicare Other | Admitting: *Deleted

## 2016-12-11 DIAGNOSIS — Z23 Encounter for immunization: Secondary | ICD-10-CM | POA: Diagnosis not present

## 2016-12-23 ENCOUNTER — Other Ambulatory Visit: Payer: Self-pay | Admitting: *Deleted

## 2016-12-23 MED ORDER — ATORVASTATIN CALCIUM 40 MG PO TABS: 40.0000 mg | ORAL_TABLET | Freq: Every day | ORAL | 1 refills | Status: DC

## 2017-01-04 ENCOUNTER — Ambulatory Visit (INDEPENDENT_AMBULATORY_CARE_PROVIDER_SITE_OTHER): Payer: Medicare Other | Admitting: Family Medicine

## 2017-01-04 ENCOUNTER — Encounter: Payer: Self-pay | Admitting: Family Medicine

## 2017-01-04 VITALS — BP 110/70 | HR 65 | Temp 98.1°F | Wt 178.7 lb

## 2017-01-04 DIAGNOSIS — E119 Type 2 diabetes mellitus without complications: Secondary | ICD-10-CM

## 2017-01-04 LAB — POCT GLYCOSYLATED HEMOGLOBIN (HGB A1C): Hemoglobin A1C: 6.7

## 2017-01-04 NOTE — Progress Notes (Signed)
Subjective:     Patient ID: Kevin Wise, male   DOB: 11-01-1935, 81 y.o.   MRN: [id number]  HPI Patient seen for follow-up type 2 diabetes. His other medical problems include history of hyperlipidemia and hypertension. He remains on metformin. Blood sugars been well controlled. He had some recent mild tingling sensation in both hands. B12 levels 254 and we recommended over-the-counter oral B12 which is taking daily. His tingling sensation has fully resolved after starting B12 replacement. Denies any polyuria or polydipsia.  Past Medical History:  Diagnosis Date  . ANEMIA 01/10/2008  . ARTHRITIS 01/06/2007  . CAROTID ARTERY STENOSIS, RIGHT 01/10/2009  . Diabetes mellitus without complication (HCC)   . ERECTILE DYSFUNCTION 01/06/2007  . HYPERGLYCEMIA, BORDERLINE 12/02/2009  . HYPERLIPIDEMIA 01/06/2007  . HYPERTENSION, BENIGN ESSENTIAL 01/06/2007  . HYPOTHYROIDISM 01/10/2008  . LIVER FUNCTION TESTS, ABNORMAL, HX OF 12/02/2009  . Other symptoms involving cardiovascular system 01/10/2009  . Rosacea 01/06/2007  . UNS ADVRS EFF OTH RX MEDICINAL&BIOLOGICAL SBSTNC 01/10/2008  . VITAMIN D DEFICIENCY 01/10/2008   Past Surgical History:  Procedure Laterality Date  . HERNIA REPAIR     bilateral inguinal   . TONSILLECTOMY      reports that  has never smoked. he has never used smokeless tobacco. He reports that he does not use drugs. His alcohol history is not on file. family history includes Diabetes in his mother; Hyperlipidemia in his mother. No Known Allergies   Review of Systems  Constitutional: Negative for fatigue.  Eyes: Negative for visual disturbance.  Respiratory: Negative for cough, chest tightness and shortness of breath.   Cardiovascular: Negative for chest pain, palpitations and leg swelling.  Endocrine: Negative for polydipsia and polyuria.  Neurological: Negative for dizziness, syncope, weakness, light-headedness and headaches.       Objective:   Physical Exam   Constitutional: He is oriented to person, place, and time. He appears well-developed and well-nourished.  HENT:  Right Ear: External ear normal.  Left Ear: External ear normal.  Mouth/Throat: Oropharynx is clear and moist.  Eyes: Pupils are equal, round, and reactive to light.  Neck: Neck supple. No thyromegaly present.  Cardiovascular: Normal rate and regular rhythm.  Pulmonary/Chest: Effort normal and breath sounds normal. No respiratory distress. He has no wheezes. He has no rales.  Musculoskeletal: He exhibits no edema.  Neurological: He is alert and oriented to person, place, and time.       Assessment:     Type 2 diabetes. Well controlled with A1c today 6.7%    Plan:     -Continue current medications -Continue regular exercise -Routine follow-up in February for complete physical  Kristian Covey MD [facility] at [facility]

## 2017-03-12 ENCOUNTER — Ambulatory Visit (INDEPENDENT_AMBULATORY_CARE_PROVIDER_SITE_OTHER): Payer: Medicare Other | Admitting: Family Medicine

## 2017-03-12 ENCOUNTER — Encounter: Payer: Self-pay | Admitting: Family Medicine

## 2017-03-12 VITALS — BP 122/76 | HR 82 | Temp 97.9°F | Wt 180.0 lb

## 2017-03-12 DIAGNOSIS — M25511 Pain in right shoulder: Secondary | ICD-10-CM | POA: Diagnosis not present

## 2017-03-12 MED ORDER — NAPROXEN 500 MG PO TABS: 500.0000 mg | ORAL_TABLET | Freq: Two times a day (BID) | ORAL | 0 refills | Status: DC | PRN

## 2017-03-12 NOTE — Patient Instructions (Addendum)
BEFORE YOU LEAVE: -rotator cuff exercises -follow up: 1 week  Ice the next few days. Then heat as needed.  Topical menthol (tiger balm) or other sports creams as needed for pain.  Exercises as tolerated.  I hope you are feeling better soon! Seek care sooner if your symptoms worsen or new concerns arise.

## 2017-03-12 NOTE — Progress Notes (Signed)
HPI: Acute visit for right shoulder pain: -Started yesterday after pushing open a door -he felt like he strained it while he was opening the door -Pain is in the upper lateral arm/shoulder -Sharp pain and rather severe initially, improved with some over-the-counter Aleve -Doing better today, but still quite sore -Denies weakness, numbness, fevers, malaise, radiation of the pain down the arm, prior shoulder issues  ROS: See pertinent positives and negatives per HPI.  Past Medical History:  Diagnosis Date  . ANEMIA 01/10/2008  . ARTHRITIS 01/06/2007  . CAROTID ARTERY STENOSIS, RIGHT 01/10/2009  . Diabetes mellitus without complication (HCC)   . ERECTILE DYSFUNCTION 01/06/2007  . HYPERGLYCEMIA, BORDERLINE 12/02/2009  . HYPERLIPIDEMIA 01/06/2007  . HYPERTENSION, BENIGN ESSENTIAL 01/06/2007  . HYPOTHYROIDISM 01/10/2008  . LIVER FUNCTION TESTS, ABNORMAL, HX OF 12/02/2009  . Other symptoms involving cardiovascular system 01/10/2009  . Rosacea 01/06/2007  . UNS ADVRS EFF OTH RX MEDICINAL&BIOLOGICAL SBSTNC 01/10/2008  . VITAMIN D DEFICIENCY 01/10/2008    Past Surgical History:  Procedure Laterality Date  . HERNIA REPAIR     bilateral inguinal   . TONSILLECTOMY      Family History  Problem Relation Age of Onset  . Hyperlipidemia Mother   . Diabetes Mother     Social History   Socioeconomic History  . Marital status: Married    Spouse name: None  . Number of children: None  . Years of education: None  . Highest education level: None  Social Needs  . Financial resource strain: None  . Food insecurity - worry: None  . Food insecurity - inability: None  . Transportation needs - medical: None  . Transportation needs - non-medical: None  Occupational History  . None  Tobacco Use  . Smoking status: Never Smoker  . Smokeless tobacco: Never Used  Substance and Sexual Activity  . Alcohol use: None  . Drug use: No  . Sexual activity: None  Other Topics Concern  . None   Social History Narrative  . None     Current Outpatient Medications:  .  aspirin 81 MG tablet, Take 81 mg by mouth daily.  , Disp: , Rfl:  .  atorvastatin (LIPITOR) 40 MG tablet, Take 1 tablet (40 mg total) by mouth daily., Disp: 90 tablet, Rfl: 1 .  fluticasone (FLONASE) 50 MCG/ACT nasal spray, INSERT 2 SPRAYS IN EACH NOSTRIL EVERY DAY, Disp: 48 g, Rfl: 1 .  glucose blood test strip, One Touch Delica. Check sugar 2 times daily. DX: 250.00, Disp: 200 each, Rfl: 2 .  latanoprost (XALATAN) 0.005 % ophthalmic solution, PLACE 1 DROP IN BOTH EYES AT BEDTIME, Disp: , Rfl: 3 .  lidocaine-hydrocortisone (ANAMANTEL HC) 3-0.5 % CREA, Place 1 Applicatorful rectally 2 (two) times daily., Disp: 7 g, Rfl: 2 .  metFORMIN (GLUCOPHAGE) 850 MG tablet, TAKE 1 TABLET BY MOUTH TWICE A DAY WITH A MEAL, Disp: 180 tablet, Rfl: 2 .  ONE TOUCH LANCETS MISC, ONE TOUCH VERIO LANCETS. Check sugar 2 times  daily. DX: 250.00, Disp: 200 each, Rfl: 2 .  quinapril-hydrochlorothiazide (ACCURETIC) 20-12.5 MG tablet, Take 1 tablet by mouth 2 (two) times daily., Disp: 180 tablet, Rfl: 3 .  traMADol (ULTRAM) 50 MG tablet, Take one to two tablets every 6 hours prn neck pain, Disp: 90 tablet, Rfl: 0 .  naproxen (NAPROSYN) 500 MG tablet, Take 1 tablet (500 mg total) by mouth 2 (two) times daily as needed., Disp: 14 tablet, Rfl: 0  EXAM:  Vitals:   03/12/17 1504  BP:  122/76  Pulse: 82  Temp: 97.9 F (36.6 C)  SpO2: 98%    Body mass index is 27.98 kg/m.  GENERAL: vitals reviewed and listed above, alert, oriented, appears well hydrated and in no acute distress  HEENT: atraumatic, conjunttiva clear, no obvious abnormalities on inspection of external nose and ears  NECK: no obvious masses on inspection  MS: moves all extremities without noticeable abnormality.  Inspection of the shoulder does not reveal any rash, bruising, swelling or step-off.  He is tender in the area of the rotator cuff attachments to the humerus.  No  other appreciable tenderness on exam.  Muscle testing is a little limited secondary to pain, seems he is afraid as he would have weakness on both sides, then would have full strength on the contralateral side with coaching, but seemed afraid to try on the affected side.  Was able to coach him into full strength in extension, flexion, abduction and internal rotation.  Abduction, particularly above 60 degrees and external rotation against resistance limited secondary to pain.  Pain with empty can testing.  Positive impingement testing on the right.  Negative shawl sign.  Neurovascularly intact distally otherwise.  PSYCH: pleasant and cooperative, no obvious depression or anxiety  ASSESSMENT AND PLAN:  Discussed the following assessment and plan:  Acute pain of right shoulder  -we discussed possible serious and likely etiologies, workup and treatment, treatment risks and return precautions -suspect rotator cuff injury -after this discussion, Finnegan opted for conservative management ice, home exercises, Aleve  (interactions and risks discussed )with close follow-up -follow up advised 1 week, may need imaging if not improving -of course, we advised Ademide  to return or notify a doctor sooner if symptoms worsen  or new concerns arise.    Patient Instructions  BEFORE YOU LEAVE: -rotator cuff exercises -follow up: 1 week  Ice the next few days. Then heat as needed.  Topical menthol (tiger balm) or other sports creams as needed for pain.  Exercises as tolerated.  I hope you are feeling better soon! Seek care sooner if your symptoms worsen or new concerns arise.      Kriste Basque R., DO

## 2017-03-22 ENCOUNTER — Ambulatory Visit (INDEPENDENT_AMBULATORY_CARE_PROVIDER_SITE_OTHER): Payer: Medicare Other | Admitting: Family Medicine

## 2017-03-22 ENCOUNTER — Encounter: Payer: Self-pay | Admitting: Family Medicine

## 2017-03-22 VITALS — BP 102/62 | HR 95 | Temp 98.2°F | Wt 180.1 lb

## 2017-03-22 DIAGNOSIS — M25511 Pain in right shoulder: Secondary | ICD-10-CM | POA: Diagnosis not present

## 2017-03-22 NOTE — Progress Notes (Signed)
Subjective:     Patient ID: Kevin Wise, male   DOB: March 01, 1935, 82 y.o.   MRN: [id number]  HPI Patient seen for follow-up regarding right shoulder pain. Recent notes reviewed. Pain first noted on 03/11/17. He was going into a [employer] store and was pushing open the door when he felt a sudden sharp pain in his right shoulder and he states his shoulder/RUE "fell down ". His right upper extremity was difficult to lift at that point (secondary to pain) any noticed some lateral shoulder pain. Denied any fall. He had fairly sharp and rather severe pain and had tried some initially over-the-counter Aleve with minimal improvement.  Was seen by Dr. Selena Batten and started on naproxen and has noted minimal relief. No neck pain. He has pain with internal rotation and abduction. Very limited abduction secondary to pain and weakness.  Past Medical History:  Diagnosis Date  . ANEMIA 01/10/2008  . ARTHRITIS 01/06/2007  . CAROTID ARTERY STENOSIS, RIGHT 01/10/2009  . Diabetes mellitus without complication (HCC)   . ERECTILE DYSFUNCTION 01/06/2007  . HYPERGLYCEMIA, BORDERLINE 12/02/2009  . HYPERLIPIDEMIA 01/06/2007  . HYPERTENSION, BENIGN ESSENTIAL 01/06/2007  . HYPOTHYROIDISM 01/10/2008  . LIVER FUNCTION TESTS, ABNORMAL, HX OF 12/02/2009  . Other symptoms involving cardiovascular system 01/10/2009  . Rosacea 01/06/2007  . UNS ADVRS EFF OTH RX MEDICINAL&BIOLOGICAL SBSTNC 01/10/2008  . VITAMIN D DEFICIENCY 01/10/2008   Past Surgical History:  Procedure Laterality Date  . HERNIA REPAIR     bilateral inguinal   . TONSILLECTOMY      reports that  has never smoked. he has never used smokeless tobacco. He reports that he does not use drugs. His alcohol history is not on file. family history includes Diabetes in his mother; Hyperlipidemia in his mother. No Known Allergies   Review of Systems  Constitutional: Negative for appetite change and unexpected weight change.  Respiratory: Negative for cough  and shortness of breath.   Cardiovascular: Negative for chest pain.  Neurological: Positive for weakness. Negative for dizziness.       Objective:   Physical Exam  Constitutional: He appears well-developed and well-nourished.  Cardiovascular: Normal rate and regular rhythm.  Pulmonary/Chest: Effort normal and breath sounds normal. No respiratory distress. He has no wheezes. He has no rales.  Musculoskeletal:  Right shoulder reveals no specific point tenderness. No biceps tenderness. No acromioclavicular tenderness. Patient has minimal pain with internal rotation. He has pain with abduction greater than about 60.  Neurological:  Patient seems have significant weakness right rotator cuff versus some limitation secondary to pain       Assessment:     Acute right shoulder pain and probable weakness. Rule out rotator cuff tear    Plan:     -Continue ice as needed for symptomatic relief 15 minutes 2-3 times daily -Set up sports medicine referral to rule out rotator cuff tear  Kristian Covey MD [facility] at [facility]

## 2017-03-22 NOTE — Patient Instructions (Signed)
Continue with ice as needed for symptom relief. We are setting up Sports Medicine referral.

## 2017-03-31 ENCOUNTER — Ambulatory Visit: Payer: Self-pay

## 2017-03-31 ENCOUNTER — Ambulatory Visit (INDEPENDENT_AMBULATORY_CARE_PROVIDER_SITE_OTHER): Payer: Medicare Other | Admitting: Family Medicine

## 2017-03-31 ENCOUNTER — Encounter: Payer: Self-pay | Admitting: Family Medicine

## 2017-03-31 VITALS — BP 118/82 | HR 64 | Ht 69.0 in | Wt 179.0 lb

## 2017-03-31 DIAGNOSIS — M75101 Unspecified rotator cuff tear or rupture of right shoulder, not specified as traumatic: Secondary | ICD-10-CM | POA: Insufficient documentation

## 2017-03-31 DIAGNOSIS — M75121 Complete rotator cuff tear or rupture of right shoulder, not specified as traumatic: Secondary | ICD-10-CM

## 2017-03-31 DIAGNOSIS — M25511 Pain in right shoulder: Secondary | ICD-10-CM | POA: Diagnosis not present

## 2017-03-31 NOTE — Assessment & Plan Note (Signed)
Injected today.  Attempted aspiration but seems to be more of a hemarthrosis.  X-rays ordered today.  With patient's weakness concern for a full-thickness rotator cuff tear that is consistent with what is found on ultrasound and patient's weakness.  Patient will get x-rays today to rule out any other type of occult fracture that could be contributing.  We discussed icing regimen.  Follow-up again in 2 weeks and if patient is not making any significant strides we may need to consider MRI or MR arthrogram.  Patient swelling seems to be worsening to be in the anterior labrum.

## 2017-03-31 NOTE — Patient Instructions (Signed)
Good to see you  I think you have a large tear.  Xray of the shoulder downstairs pennsaid pinkie amount topically 2 times daily as needed.  Exercises 3 times a week.  Keep hands within peripheral vision.  See me again in 2-3 weeks.

## 2017-03-31 NOTE — Progress Notes (Signed)
[name] [address] [place], [place] [address] Phone: [phone] Subjective:    I'm seeing this patient by the request  of:  Kristian Covey, MD   CC: Right shoulder pain  [id number]  Kevin Wise is a 82 y.o. male coming in right shoulder pain for the past 3 weeks. He was opening a swinging door when he felt a "pop" and sharp pain. He said that his shoulder was weak and unable to use it after this incident. He has had improvement but does feel pain in the entire joint. Denies any radicular symptoms.   Onset- acute Location- right shoulder Duration- intermittent Character- sharp, achy now Aggravating factors- moving arm Reliving factors-time Therapies tried-nothing really some mild ice Severity-initially 9 out of 10 now 6 out of 10     Past Medical History:  Diagnosis Date  . ANEMIA 01/10/2008  . ARTHRITIS 01/06/2007  . CAROTID ARTERY STENOSIS, RIGHT 01/10/2009  . Diabetes mellitus without complication (HCC)   . ERECTILE DYSFUNCTION 01/06/2007  . HYPERGLYCEMIA, BORDERLINE 12/02/2009  . HYPERLIPIDEMIA 01/06/2007  . HYPERTENSION, BENIGN ESSENTIAL 01/06/2007  . HYPOTHYROIDISM 01/10/2008  . LIVER FUNCTION TESTS, ABNORMAL, HX OF 12/02/2009  . Other symptoms involving cardiovascular system 01/10/2009  . Rosacea 01/06/2007  . UNS ADVRS EFF OTH RX MEDICINAL&BIOLOGICAL SBSTNC 01/10/2008  . VITAMIN D DEFICIENCY 01/10/2008   Past Surgical History:  Procedure Laterality Date  . HERNIA REPAIR     bilateral inguinal   . TONSILLECTOMY     Social History   Socioeconomic History  . Marital status: Married    Spouse name: None  . Number of children: None  . Years of education: None  . Highest education level: None  Social Needs  . Financial resource strain: None  . Food insecurity - worry: None  . Food insecurity - inability: None  . Transportation needs - medical: None  . Transportation needs - non-medical: None  Occupational  History  . None  Tobacco Use  . Smoking status: Never Smoker  . Smokeless tobacco: Never Used  Substance and Sexual Activity  . Alcohol use: None  . Drug use: No  . Sexual activity: None  Other Topics Concern  . None  Social History Narrative  . None   No Known Allergies Family History  Problem Relation Age of Onset  . Hyperlipidemia Mother   . Diabetes Mother      Past medical history, social, surgical and family history all reviewed in electronic medical record.  No pertanent information unless stated regarding to the chief complaint.   Review of Systems:Review of systems updated and as accurate as of 03/31/17  No headache, visual changes, nausea, vomiting, diarrhea, constipation, dizziness, abdominal pain, skin rash, fevers, chills, night sweats, weight loss, swollen lymph nodes, body aches, joint swelling, muscle aches, chest pain, shortness of breath, mood changes.   Objective  Blood pressure 118/82, pulse 64, height 5\' 9"  (1.753 m), weight 179 lb (81.2 kg), SpO2 98 %. Systems examined below as of 03/31/17   General: No apparent distress alert and oriented x3 mood and affect normal, dressed appropriately.  HEENT: Pupils equal, extraocular movements intact  Respiratory: Patient's speak in full sentences and does not appear short of breath  Cardiovascular: No lower extremity edema, non tender, no erythema  Skin: Warm dry intact with no signs of infection or rash on extremities or on axial skeleton.  Abdomen: Soft nontender  Neuro: Cranial nerves II through XII are intact, neurovascularly intact  in all extremities with 2+ DTRs and 2+ pulses.  Lymph: No lymphadenopathy of posterior or anterior cervical chain or axillae bilaterally.  Gait normal with good balance and coordination.  MSK:  Non tender with full range of motion and good stability and symmetric strength and tone of  elbows, wrist, hip, knee and ankles bilaterally.  Shoulder: Right Inspection reveals no  abnormalities, atrophy or asymmetry. Palpation is normal with no tenderness over AC joint or bicipital groove. ROM is restricted lacking last 10 degrees of external rotation internal rotation 5 degrees as well. Rotator cuff strength 3 out of 5 compared to contralateral side signs of impingement with positive Neer and Hawkin's tests, but positive empty can Speeds and Yergason's tests normal. Positive O'Brien's Normal scapular function observed. Positive painful arc Contralateral shoulder unremarkable  MSK US performed of: Right This study was ordered, performed, and interpreted by Terrilee Files D.O.  Shoulder:   Supraspinatus: Significant swelling hemarthrosis noted.  Patient does have what appears to be a retraction noted. Infraspinatus:  Appears normal on long and transverse views. Significant increase in Doppler flow Subscapularis: Tear noted. Teres Minor:  Appears normal on long and transverse views. AC joint: Mild arthritis Glenohumeral Joint: Mild arthritis Glenoid Labrum:  Intact without visualized tears. Biceps Tendon: Hypoechoic changes noted.  Impression: Large rotator cuff tear of the subscapularis, supraspinatus with retraction, as well as likely anterior labral tear  Procedure: Real-time Ultrasound Guided Injection of right glenohumeral joint Device: GE Logiq E  Ultrasound guided injection is preferred based studies that show increased duration, increased effect, greater accuracy, decreased procedural pain, increased response rate with ultrasound guided versus blind injection.  Verbal informed consent obtained.  Time-out conducted.  Noted no overlying erythema, induration, or other signs of local infection.  Skin prepped in a sterile fashion.  Local anesthesia: Topical Ethyl chloride.  With sterile technique and under real time ultrasound guidance:  Joint visualized.  23g 1  inch needle inserted posterior approach. Pictures taken for needle placement. Patient did have  injection of 2 cc of 1% lidocaine, 2 cc of 0.5% Marcaine, and 1.0 cc of Kenalog 40 mg/dL. Completed without difficulty  Pain immediately resolved suggesting accurate placement of the medication.  Advised to call if fevers/chills, erythema, induration, drainage, or persistent bleeding.  Images permanently stored and available for review in the ultrasound unit.  Impression: Technically successful ultrasound guided injection.    Impression and Recommendations:     This case required medical decision making of moderate complexity.      Note: This dictation was prepared with Dragon dictation along with smaller phrase technology. Any transcriptional errors that result from this process are unintentional.

## 2017-04-11 ENCOUNTER — Other Ambulatory Visit: Payer: Self-pay | Admitting: Family Medicine

## 2017-04-12 ENCOUNTER — Ambulatory Visit (INDEPENDENT_AMBULATORY_CARE_PROVIDER_SITE_OTHER): Payer: Medicare Other | Admitting: Family Medicine

## 2017-04-12 ENCOUNTER — Encounter: Payer: Self-pay | Admitting: Family Medicine

## 2017-04-12 VITALS — BP 110/68 | HR 67 | Temp 98.1°F | Ht 68.0 in | Wt 177.8 lb

## 2017-04-12 DIAGNOSIS — E119 Type 2 diabetes mellitus without complications: Secondary | ICD-10-CM | POA: Diagnosis not present

## 2017-04-12 DIAGNOSIS — E785 Hyperlipidemia, unspecified: Secondary | ICD-10-CM | POA: Diagnosis not present

## 2017-04-12 DIAGNOSIS — Z0000 Encounter for general adult medical examination without abnormal findings: Secondary | ICD-10-CM

## 2017-04-12 DIAGNOSIS — I10 Essential (primary) hypertension: Secondary | ICD-10-CM

## 2017-04-12 LAB — BASIC METABOLIC PANEL
BUN: 22 mg/dL (ref 6–23)
CALCIUM: 9.4 mg/dL (ref 8.4–10.5)
CO2: 29 mEq/L (ref 19–32)
Chloride: 100 mEq/L (ref 96–112)
Creatinine, Ser: 1.18 mg/dL (ref 0.40–1.50)
GFR: 76.53 mL/min (ref >60.00)
Glucose, Bld: 99 mg/dL (ref 70–99)
POTASSIUM: 4.4 meq/L (ref 3.5–5.1)
SODIUM: 138 meq/L (ref 135–145)

## 2017-04-12 LAB — LIPID PANEL
CHOLESTEROL: 120 mg/dL (ref 0–200)
HDL: 42.3 mg/dL (ref >39.00)
LDL Cholesterol: 60 mg/dL (ref 0–99)
NONHDL: 77.65
TRIGLYCERIDES: 88 mg/dL (ref 0.0–149.0)
Total CHOL/HDL Ratio: 3
VLDL: 17.6 mg/dL (ref 0.0–40.0)

## 2017-04-12 LAB — CBC WITH DIFFERENTIAL/PLATELET
Basophils Absolute: 0 10*3/uL (ref 0.0–0.1)
Basophils Relative: 0.5 % (ref 0.0–3.0)
Eosinophils Absolute: 0.1 10*3/uL (ref 0.0–0.7)
Eosinophils Relative: 1.2 % (ref 0.0–5.0)
HCT: 39 % (ref 39.0–52.0)
Hemoglobin: 12.8 g/dL — ABNORMAL LOW (ref 13.0–17.0)
LYMPHS ABS: 3 10*3/uL (ref 0.7–4.0)
Lymphocytes Relative: 35.9 % (ref 12.0–46.0)
MCHC: 32.8 g/dL (ref 30.0–36.0)
MCV: 93.1 fl (ref 78.0–100.0)
MONO ABS: 0.6 10*3/uL (ref 0.1–1.0)
MONOS PCT: 6.7 % (ref 3.0–12.0)
NEUTROS ABS: 4.7 10*3/uL (ref 1.4–7.7)
NEUTROS PCT: 55.7 % (ref 43.0–77.0)
PLATELETS: 306 10*3/uL (ref 150.0–400.0)
RBC: 4.19 Mil/uL — ABNORMAL LOW (ref 4.22–5.81)
RDW: 13.8 % (ref 11.5–15.5)
WBC: 8.4 10*3/uL (ref 4.0–10.5)

## 2017-04-12 LAB — HEMOGLOBIN A1C: HEMOGLOBIN A1C: 7.2 % — AB (ref 4.6–6.5)

## 2017-04-12 LAB — HEPATIC FUNCTION PANEL
ALK PHOS: 47 U/L (ref 39–117)
ALT: 19 U/L (ref 0–53)
AST: 15 U/L (ref 0–37)
Albumin: 4.5 g/dL (ref 3.5–5.2)
Bilirubin, Direct: 0.1 mg/dL (ref 0.0–0.3)
TOTAL PROTEIN: 7.4 g/dL (ref 6.0–8.3)
Total Bilirubin: 0.6 mg/dL (ref 0.2–1.2)

## 2017-04-12 LAB — PSA: PSA: 1.01 ng/mL (ref 0.10–4.00)

## 2017-04-12 MED ORDER — METFORMIN HCL 850 MG PO TABS: ORAL_TABLET | ORAL | 11 refills | Status: DC

## 2017-04-12 MED ORDER — ATORVASTATIN CALCIUM 40 MG PO TABS: 40.0000 mg | ORAL_TABLET | Freq: Every day | ORAL | 11 refills | Status: DC

## 2017-04-12 MED ORDER — QUINAPRIL-HYDROCHLOROTHIAZIDE 20-12.5 MG PO TABS: 1.0000 | ORAL_TABLET | Freq: Two times a day (BID) | ORAL | 11 refills | Status: DC

## 2017-04-12 MED ORDER — FLUTICASONE PROPIONATE 50 MCG/ACT NA SUSP: NASAL | 3 refills | Status: DC

## 2017-04-12 NOTE — Progress Notes (Signed)
Subjective:     Patient ID: Kevin Wise, male   DOB: March 11, 1935, 82 y.o.   MRN: [id number]  HPI Patient seen for physical exam. He had recent right shoulder injury and ultrasound revealed tear subscapularis and supraspinatus. He received injection and is doing much better with much less pain. Also has improved range of motion.  Patient has history of type 2 diabetes, hypertension, dyslipidemia. Medications reviewed. Compliant with all. Needs several refills today.  Will be due for repeat colonoscopy 2022. Immunizations up-to-date. Patient denies any recent depression symptoms. No recent falls.  Past Medical History:  Diagnosis Date  . ANEMIA 01/10/2008  . ARTHRITIS 01/06/2007  . CAROTID ARTERY STENOSIS, RIGHT 01/10/2009  . Diabetes mellitus without complication (HCC)   . ERECTILE DYSFUNCTION 01/06/2007  . HYPERGLYCEMIA, BORDERLINE 12/02/2009  . HYPERLIPIDEMIA 01/06/2007  . HYPERTENSION, BENIGN ESSENTIAL 01/06/2007  . HYPOTHYROIDISM 01/10/2008  . LIVER FUNCTION TESTS, ABNORMAL, HX OF 12/02/2009  . Other symptoms involving cardiovascular system 01/10/2009  . Rosacea 01/06/2007  . UNS ADVRS EFF OTH RX MEDICINAL&BIOLOGICAL SBSTNC 01/10/2008  . VITAMIN D DEFICIENCY 01/10/2008   Past Surgical History:  Procedure Laterality Date  . HERNIA REPAIR     bilateral inguinal   . TONSILLECTOMY      reports that  has never smoked. he has never used smokeless tobacco. He reports that he does not use drugs. His alcohol history is not on file. family history includes Diabetes in his mother; Hyperlipidemia in his mother. No Known Allergies   Review of Systems  Constitutional: Negative for activity change, appetite change, fatigue, fever and unexpected weight change.  HENT: Negative for congestion, ear pain and trouble swallowing.   Eyes: Negative for pain and visual disturbance.  Respiratory: Negative for cough, shortness of breath and wheezing.   Cardiovascular: Negative for chest pain and  palpitations.  Gastrointestinal: Negative for abdominal distention, abdominal pain, blood in stool, constipation, diarrhea, nausea, rectal pain and vomiting.  Endocrine: Negative for polydipsia and polyuria.  Genitourinary: Negative for dysuria, hematuria and testicular pain.  Musculoskeletal: Negative for arthralgias and joint swelling.  Skin: Negative for rash.  Neurological: Negative for dizziness, syncope and headaches.  Hematological: Negative for adenopathy.  Psychiatric/Behavioral: Negative for confusion and dysphoric mood.       Objective:   Physical Exam  Constitutional: He is oriented to person, place, and time. He appears well-developed and well-nourished. No distress.  HENT:  Head: Normocephalic and atraumatic.  Right Ear: External ear normal.  Left Ear: External ear normal.  Mouth/Throat: Oropharynx is clear and moist. No oropharyngeal exudate.  Eyes: Conjunctivae and EOM are normal. Pupils are equal, round, and reactive to light.  Neck: Normal range of motion. Neck supple. No thyromegaly present.  Cardiovascular: Normal rate, regular rhythm and normal heart sounds.  No murmur heard. Pulmonary/Chest: No respiratory distress. He has no wheezes. He has no rales.  Abdominal: Soft. Bowel sounds are normal. He exhibits no distension and no mass. There is no tenderness. There is no rebound and no guarding.  Musculoskeletal: He exhibits no edema.  Lymphadenopathy:    He has no cervical adenopathy.  Neurological: He is alert and oriented to person, place, and time. He displays normal reflexes. No cranial nerve deficit.  Skin: No rash noted.  Psychiatric: He has a normal mood and affect.       Assessment:     Physical exam. Health maintenance up-to-date. Several issues discussed as below    Plan:     -Refill several regular medications  including Lipitor, Flonase, metformin, quinapril HCTZ -Obtain lab work including hemoglobin A1c, lipid panel, hepatic panel, basic  metabolic panel -Long discussion regarding PSA testing. We explained [employer] does not recommend screening after age 10. Patient nevertheless requests testing after full discussion of pros and cons -Continue with yearly flu vaccine  Kristian Covey MD [facility]

## 2017-04-17 NOTE — Progress Notes (Signed)
[name] [address] [place], [place] [address] Phone: [phone] Subjective:       CC: Right shoulder pain follow-up  [id number]  Kevin Wise is a 82 y.o. male coming in with complaint of right shoulder pain.  Found to have a rotator cuff.  Potential anterior labral tear.  Patient was given an injection at that time.  Was to do home exercises.  Patient states that he is able to move his shoulder more than last visit. Overhead motions still bother him and most of his pain is over the anteiror portion of the shoulder.      Past Medical History:  Diagnosis Date  . ANEMIA 01/10/2008  . ARTHRITIS 01/06/2007  . CAROTID ARTERY STENOSIS, RIGHT 01/10/2009  . Diabetes mellitus without complication (HCC)   . ERECTILE DYSFUNCTION 01/06/2007  . HYPERGLYCEMIA, BORDERLINE 12/02/2009  . HYPERLIPIDEMIA 01/06/2007  . HYPERTENSION, BENIGN ESSENTIAL 01/06/2007  . HYPOTHYROIDISM 01/10/2008  . LIVER FUNCTION TESTS, ABNORMAL, HX OF 12/02/2009  . Other symptoms involving cardiovascular system 01/10/2009  . Rosacea 01/06/2007  . UNS ADVRS EFF OTH RX MEDICINAL&BIOLOGICAL SBSTNC 01/10/2008  . VITAMIN D DEFICIENCY 01/10/2008   Past Surgical History:  Procedure Laterality Date  . HERNIA REPAIR     bilateral inguinal   . TONSILLECTOMY     Social History   Socioeconomic History  . Marital status: Married    Spouse name: None  . Number of children: None  . Years of education: None  . Highest education level: None  Social Needs  . Financial resource strain: None  . Food insecurity - worry: None  . Food insecurity - inability: None  . Transportation needs - medical: None  . Transportation needs - non-medical: None  Occupational History  . None  Tobacco Use  . Smoking status: Never Smoker  . Smokeless tobacco: Never Used  Substance and Sexual Activity  . Alcohol use: None  . Drug use: No  . Sexual activity: None  Other Topics Concern  . None    Social History Narrative  . None   No Known Allergies Family History  Problem Relation Age of Onset  . Hyperlipidemia Mother   . Diabetes Mother      Past medical history, social, surgical and family history all reviewed in electronic medical record.  No pertanent information unless stated regarding to the chief complaint.   Review of Systems:Review of systems updated and as accurate as of 04/19/17  No headache, visual changes, nausea, vomiting, diarrhea, constipation, dizziness, abdominal pain, skin rash, fevers, chills, night sweats, weight loss, swollen lymph nodes, body aches, joint swelling, muscle aches, chest pain, shortness of breath, mood changes.   Objective  Blood pressure 128/68, pulse 81, height 5\' 8"  (1.727 m), weight 177 lb 9.6 oz (80.6 kg), SpO2 98 %. Systems examined below as of 04/19/17   General: No apparent distress alert and oriented x3 mood and affect normal, dressed appropriately.  HEENT: Pupils equal, extraocular movements intact  Respiratory: Patient's speak in full sentences and does not appear short of breath  Cardiovascular: No lower extremity edema, non tender, no erythema  Skin: Warm dry intact with no signs of infection or rash on extremities or on axial skeleton.  Abdomen: Soft nontender  Neuro: Cranial nerves II through XII are intact, neurovascularly intact in all extremities with 2+ DTRs and 2+ pulses.  Lymph: No lymphadenopathy of posterior or anterior cervical chain or axillae bilaterally.  Gait normal with good balance and coordination.  MSK:  Non tender with full range of motion and good stability and symmetric strength and tone of  elbows, wrist, hip, knee and ankles bilaterally.  Mild arthritic changes  Patient shoulder exam still shows 4 out of 5 strength but this is improved from previous exam.  Positive labral pathology with a gravid.  Significant increase in range of motion.  Neurovascularly intact distally.  Mild crepitus with range of  motion.    Impression and Recommendations:     This case required medical decision making of moderate complexity.      Note: This dictation was prepared with Dragon dictation along with smaller phrase technology. Any transcriptional errors that result from this process are unintentional.

## 2017-04-19 ENCOUNTER — Encounter: Payer: Self-pay | Admitting: Family Medicine

## 2017-04-19 ENCOUNTER — Ambulatory Visit: Payer: Medicare Other | Admitting: Family Medicine

## 2017-04-19 DIAGNOSIS — M75121 Complete rotator cuff tear or rupture of right shoulder, not specified as traumatic: Secondary | ICD-10-CM

## 2017-04-19 NOTE — Assessment & Plan Note (Signed)
Doing very well.  Can repeat injections if needed.  Discussed icing regimen and home exercises.  Discussed this to do which wants to avoid.  Patient will come back and see me again in 6 weeks.

## 2017-04-19 NOTE — Patient Instructions (Signed)
Good to see you  Kevin Wise is your friend.  Keep hands within peripheral vision.  Keep up with the exercises 2-3 times a week  See me again in 4-5 weeks

## 2017-05-15 NOTE — Progress Notes (Signed)
[name] [address] [place], [place] [address] Phone: [phone] Subjective:     CC: Right shoulder pain follow-up  [id number]  Kevin Wise is a 82 y.o. male coming in with complaint of right shoulder pain.  Found to have rotator cuff tear and anterior labral tear.  Given injection initially.  Was doing better at last follow-up 3 weeks ago.  Patient was to continue with home exercises and icing regimen.  Patient states if anything only mild improvement.  Patient states that the nighttime pain seems to be worsening.    Past Medical History:  Diagnosis Date  . ANEMIA 01/10/2008  . ARTHRITIS 01/06/2007  . CAROTID ARTERY STENOSIS, RIGHT 01/10/2009  . Diabetes mellitus without complication (HCC)   . ERECTILE DYSFUNCTION 01/06/2007  . HYPERGLYCEMIA, BORDERLINE 12/02/2009  . HYPERLIPIDEMIA 01/06/2007  . HYPERTENSION, BENIGN ESSENTIAL 01/06/2007  . HYPOTHYROIDISM 01/10/2008  . LIVER FUNCTION TESTS, ABNORMAL, HX OF 12/02/2009  . Other symptoms involving cardiovascular system 01/10/2009  . Rosacea 01/06/2007  . UNS ADVRS EFF OTH RX MEDICINAL&BIOLOGICAL SBSTNC 01/10/2008  . VITAMIN D DEFICIENCY 01/10/2008   Past Surgical History:  Procedure Laterality Date  . HERNIA REPAIR     bilateral inguinal   . TONSILLECTOMY     Social History   Socioeconomic History  . Marital status: Married    Spouse name: Not on file  . Number of children: Not on file  . Years of education: Not on file  . Highest education level: Not on file  Occupational History  . Not on file  Social Needs  . Financial resource strain: Not on file  . Food insecurity:    Worry: Not on file    Inability: Not on file  . Transportation needs:    Medical: Not on file    Non-medical: Not on file  Tobacco Use  . Smoking status: Never Smoker  . Smokeless tobacco: Never Used  Substance and Sexual Activity  . Alcohol use: Not on file  . Drug use: No  . Sexual activity: Not  on file  Lifestyle  . Physical activity:    Days per week: Not on file    Minutes per session: Not on file  . Stress: Not on file  Relationships  . Social connections:    Talks on phone: Not on file    Gets together: Not on file    Attends religious service: Not on file    Active member of club or organization: Not on file    Attends meetings of clubs or organizations: Not on file    Relationship status: Not on file  Other Topics Concern  . Not on file  Social History Narrative  . Not on file   No Known Allergies Family History  Problem Relation Age of Onset  . Hyperlipidemia Mother   . Diabetes Mother      Past medical history, social, surgical and family history all reviewed in electronic medical record.  No pertanent information unless stated regarding to the chief complaint.   Review of Systems:Review of systems updated and as accurate as of 05/17/17  No headache, visual changes, nausea, vomiting, diarrhea, constipation, dizziness, abdominal pain, skin rash, fevers, chills, night sweats, weight loss, swollen lymph nodes, body aches, joint swelling,  chest pain, shortness of breath, mood changes.  Mild positive muscle aches  Objective  Blood pressure 122/80, pulse 67, height 5\' 8"  (1.727 m), weight 179 lb (81.2 kg), SpO2 98 %. Systems examined below  as of 05/17/17   General: No apparent distress alert and oriented x3 mood and affect normal, dressed appropriately.  HEENT: Pupils equal, extraocular movements intact  Respiratory: Patient's speak in full sentences and does not appear short of breath  Cardiovascular: No lower extremity edema, non tender, no erythema  Skin: Warm dry intact with no signs of infection or rash on extremities or on axial skeleton.  Abdomen: Soft nontender  Neuro: Cranial nerves II through XII are intact, neurovascularly intact in all extremities with 2+ DTRs and 2+ pulses.  Lymph: No lymphadenopathy of posterior or anterior cervical chain or  axillae bilaterally.  Gait normal with good balance and coordination.  MSK:  Non tender with full range of motion and good stability and symmetric strength and tone of  elbows, wrist, hip, knee and ankles bilaterally.  Shoulder: Right Inspection reveals no abnormalities, atrophy or asymmetry. Palpation is normal with no tenderness over AC joint or bicipital groove. ROM is full in all planes. Rotator cuff strength 4+ out of 5 compared to the contralateral side.  More improvement Mild impingement noted Speeds and Yergason's tests normal. Positive O'Brien's Normal scapular function observed. No painful arc and no drop arm sign. No apprehension sign Contralateral shoulder unremarkable      Impression and Recommendations:     This case required medical decision making of moderate complexity.      Note: This dictation was prepared with Dragon dictation along with smaller phrase technology. Any transcriptional errors that result from this process are unintentional.

## 2017-05-17 ENCOUNTER — Encounter: Payer: Self-pay | Admitting: Family Medicine

## 2017-05-17 ENCOUNTER — Ambulatory Visit (INDEPENDENT_AMBULATORY_CARE_PROVIDER_SITE_OTHER)
Admission: RE | Admit: 2017-05-17 | Discharge: 2017-05-17 | Disposition: A | Discharge status: Home / Self Care | Payer: Medicare Other | Source: Ambulatory Visit | Attending: Family Medicine | Admitting: Family Medicine

## 2017-05-17 ENCOUNTER — Ambulatory Visit: Payer: Medicare Other | Admitting: Family Medicine

## 2017-05-17 DIAGNOSIS — M25511 Pain in right shoulder: Secondary | ICD-10-CM

## 2017-05-17 DIAGNOSIS — M75121 Complete rotator cuff tear or rupture of right shoulder, not specified as traumatic: Secondary | ICD-10-CM | POA: Diagnosis not present

## 2017-05-17 MED ORDER — GABAPENTIN 100 MG PO CAPS: 100.0000 mg | ORAL_CAPSULE | Freq: Every day | ORAL | 3 refills | Status: DC

## 2017-05-17 NOTE — Patient Instructions (Addendum)
Good to see you  Ice is your friend at night pennsaid pinkie amount topically 2 times daily as needed.   Gabapentin 100mg  at night to help sleep  Continue the exercises 2-3 times a week  See me again in 6 weeks

## 2017-05-17 NOTE — Assessment & Plan Note (Signed)
Patient is making some progress.  Discussed icing regimen and home exercises.  Discussed which activities to doing which wants to avoid.  Patient is to increase activity slowly over the course the next several days.  Follow-up with me again 4-8 weeks.

## 2017-06-21 ENCOUNTER — Ambulatory Visit: Payer: Medicare Other | Admitting: Family Medicine

## 2017-07-04 NOTE — Progress Notes (Signed)
[name] [address] [place], [place] [address] Phone: [phone] Subjective:    CC: Right shoulder pain follow-up  IHK:[id number]  Kevin Wise is a 82 y.o. male coming in with complaint of  Right shoulder pain.  Found to have a rotator cuff tear.  Patient also had what seemed to be more of an anterior labral tear.  Had been making some improvement.  Patient no given gabapentin for nighttime relief which also helped a little bit but now the pain is starting to worsen again.  Describes the pain as a dull, throbbing aching sensation.    Past Medical History:  Diagnosis Date  . ANEMIA 01/10/2008  . ARTHRITIS 01/06/2007  . CAROTID ARTERY STENOSIS, RIGHT 01/10/2009  . Diabetes mellitus without complication (HCC)   . ERECTILE DYSFUNCTION 01/06/2007  . HYPERGLYCEMIA, BORDERLINE 12/02/2009  . HYPERLIPIDEMIA 01/06/2007  . HYPERTENSION, BENIGN ESSENTIAL 01/06/2007  . HYPOTHYROIDISM 01/10/2008  . LIVER FUNCTION TESTS, ABNORMAL, HX OF 12/02/2009  . Other symptoms involving cardiovascular system 01/10/2009  . Rosacea 01/06/2007  . UNS ADVRS EFF OTH RX MEDICINAL&BIOLOGICAL SBSTNC 01/10/2008  . VITAMIN D DEFICIENCY 01/10/2008   Past Surgical History:  Procedure Laterality Date  . HERNIA REPAIR     bilateral inguinal   . TONSILLECTOMY     Social History   Socioeconomic History  . Marital status: Married    Spouse name: Not on file  . Number of children: Not on file  . Years of education: Not on file  . Highest education level: Not on file  Occupational History  . Not on file  Social Needs  . Financial resource strain: Not on file  . Food insecurity:    Worry: Not on file    Inability: Not on file  . Transportation needs:    Medical: Not on file    Non-medical: Not on file  Tobacco Use  . Smoking status: Never Smoker  . Smokeless tobacco: Never Used  Substance and Sexual Activity  . Alcohol use: Not on file  . Drug use: No  . Sexual  activity: Not on file  Lifestyle  . Physical activity:    Days per week: Not on file    Minutes per session: Not on file  . Stress: Not on file  Relationships  . Social connections:    Talks on phone: Not on file    Gets together: Not on file    Attends religious service: Not on file    Active member of club or organization: Not on file    Attends meetings of clubs or organizations: Not on file    Relationship status: Not on file  Other Topics Concern  . Not on file  Social History Narrative  . Not on file   No Known Allergies Family History  Problem Relation Age of Onset  . Hyperlipidemia Mother   . Diabetes Mother      Past medical history, social, surgical and family history all reviewed in electronic medical record.  No pertanent information unless stated regarding to the chief complaint.   Review of Systems:Review of systems updated and as accurate as of 07/05/17  No headache, visual changes, nausea, vomiting, diarrhea, constipation, dizziness, abdominal pain, skin rash, fevers, chills, night sweats, weight loss, swollen lymph nodes, body aches, joint swelling, chest pain, shortness of breath, mood changes.  Positive muscle aches  Objective  Blood pressure 134/74, pulse 64, height 5\' 8"  (1.727 m), weight 178 lb (80.7 kg), SpO2 98 %.  Systems examined below as of 07/05/17   General: No apparent distress alert and oriented x3 mood and affect normal, dressed appropriately.  HEENT: Pupils equal, extraocular movements intact  Respiratory: Patient's speak in full sentences and does not appear short of breath  Cardiovascular: No lower extremity edema, non tender, no erythema  Skin: Warm dry intact with no signs of infection or rash on extremities or on axial skeleton.  Abdomen: Soft nontender  Neuro: Cranial nerves II through XII are intact, neurovascularly intact in all extremities with 2+ DTRs and 2+ pulses.  Lymph: No lymphadenopathy of posterior or anterior cervical chain  or axillae bilaterally.  Gait normal with good balance and coordination.  MSK:  Non tender with full range of motion and good stability and symmetric strength and tone of  elbows, wrist, hip, knee and ankles bilaterally.  Shoulder: Inspection reveals no abnormalities, atrophy or asymmetry. Palpation is normal with no tenderness over AC joint or bicipital groove. ROM is full in all planes. Rotator cuff strength 4 out of 5 compared to the contralateral side.  Positive impingement Speeds and Yergason's tests normal. No labral pathology noted with negative Obrien's, negative clunk and good stability. Normal scapular function observed. Mild painful arc No apprehension sign Contralateral shoulder unremarkable      Impression and Recommendations:     This case required medical decision making of moderate complexity.      Note: This dictation was prepared with Dragon dictation along with smaller phrase technology. Any transcriptional errors that result from this process are unintentional.

## 2017-07-05 ENCOUNTER — Ambulatory Visit: Payer: Medicare Other | Admitting: Family Medicine

## 2017-07-05 ENCOUNTER — Encounter: Payer: Self-pay | Admitting: Family Medicine

## 2017-07-05 VITALS — BP 134/74 | HR 64 | Ht 68.0 in | Wt 178.0 lb

## 2017-07-05 DIAGNOSIS — M75121 Complete rotator cuff tear or rupture of right shoulder, not specified as traumatic: Secondary | ICD-10-CM

## 2017-07-05 DIAGNOSIS — G8929 Other chronic pain: Secondary | ICD-10-CM

## 2017-07-05 DIAGNOSIS — M25519 Pain in unspecified shoulder: Secondary | ICD-10-CM

## 2017-07-05 NOTE — Patient Instructions (Addendum)
Good to see you  Kevin Wise is your friend.  Try to keep hands within peripheral vision  We will try PT and they will be calling you  Stay active See me again in 3-4 weeks and if not where we want to be we will do injection again

## 2017-07-05 NOTE — Assessment & Plan Note (Addendum)
Some worsening symptoms at this time.  Discussed icing regimen and home exercises.  Discussed which activities to do which wants to avoid.  Patient will be sent to physical therapy patient is to increase activity as tolerated.  Follow-up again 4 to 8 weeks worsening symptoms consider injections.

## 2017-07-12 ENCOUNTER — Encounter: Payer: Self-pay | Admitting: Family Medicine

## 2017-07-12 ENCOUNTER — Ambulatory Visit (INDEPENDENT_AMBULATORY_CARE_PROVIDER_SITE_OTHER): Payer: Medicare Other | Admitting: Family Medicine

## 2017-07-12 VITALS — BP 110/70 | HR 98 | Temp 98.9°F | Wt 175.2 lb

## 2017-07-12 DIAGNOSIS — E119 Type 2 diabetes mellitus without complications: Secondary | ICD-10-CM | POA: Diagnosis not present

## 2017-07-12 LAB — POCT GLYCOSYLATED HEMOGLOBIN (HGB A1C): Hemoglobin A1C: 6

## 2017-07-12 NOTE — Progress Notes (Signed)
  Subjective:     Patient ID: Kevin Wise, male   DOB: April 13, 1935, 82 y.o.   MRN: [id number]  HPI Follow-up type 2 diabetes. Last A1c was 7.2% back in February. Since that time, he has made some positive changes with diet. He is exercising at the gym about 3 days per week. Still having some issues with his right shoulder and getting set up for some physical therapy. Denies any chest pain, dizziness, dyspnea, or any other complaints  Past Medical History:  Diagnosis Date  . ANEMIA 01/10/2008  . ARTHRITIS 01/06/2007  . CAROTID ARTERY STENOSIS, RIGHT 01/10/2009  . Diabetes mellitus without complication (HCC)   . ERECTILE DYSFUNCTION 01/06/2007  . HYPERGLYCEMIA, BORDERLINE 12/02/2009  . HYPERLIPIDEMIA 01/06/2007  . HYPERTENSION, BENIGN ESSENTIAL 01/06/2007  . HYPOTHYROIDISM 01/10/2008  . LIVER FUNCTION TESTS, ABNORMAL, HX OF 12/02/2009  . Other symptoms involving cardiovascular system 01/10/2009  . Rosacea 01/06/2007  . UNS ADVRS EFF OTH RX MEDICINAL&BIOLOGICAL SBSTNC 01/10/2008  . VITAMIN D DEFICIENCY 01/10/2008   Past Surgical History:  Procedure Laterality Date  . HERNIA REPAIR     bilateral inguinal   . TONSILLECTOMY      reports that he has never smoked. He has never used smokeless tobacco. He reports that he does not use drugs. His alcohol history is not on file. family history includes Diabetes in his mother; Hyperlipidemia in his mother. No Known Allergies   Review of Systems  Constitutional: Negative for fatigue and unexpected weight change.  Eyes: Negative for visual disturbance.  Respiratory: Negative for cough, chest tightness and shortness of breath.   Cardiovascular: Negative for chest pain, palpitations and leg swelling.  Endocrine: Negative for polydipsia and polyuria.  Neurological: Negative for dizziness, syncope, weakness, light-headedness and headaches.       Objective:   Physical Exam  Constitutional: He appears well-developed and well-nourished.   Cardiovascular: Normal rate and regular rhythm. Exam reveals no gallop.  Pulmonary/Chest: Effort normal and breath sounds normal. He has no wheezes. He has no rales.  Musculoskeletal: He exhibits no edema.       Assessment:     Type 2 diabetes-improved with A1c 6.0%    Plan:     -continue regular exercise habits -Continue with yearly eye exams -Routine follow-up in 3 months and sooner as needed  Kristian Covey MD [facility]

## 2017-08-02 ENCOUNTER — Ambulatory Visit: Payer: Medicare Other | Admitting: Family Medicine

## 2017-08-21 NOTE — Progress Notes (Signed)
[facility] [address] [place], [place] [address] Phone: [phone] Subjective:    I'm seeing this patient by the request  of:    CC: Right shoulder pain  NTI:[id number]  Kevin Wise is a 82 y.o. male coming in with complaint of shoulder pain. Shoulder feels the same. Is doing the exercises that we provided. Has good days and bad days. Most of his pain occurs at night.  Patient has been seen previously and did have a rotator cuff tear and likely a labral tear with anterior swelling noted.     Past Medical History:  Diagnosis Date  . ANEMIA 01/10/2008  . ARTHRITIS 01/06/2007  . CAROTID ARTERY STENOSIS, RIGHT 01/10/2009  . Diabetes mellitus without complication (HCC)   . ERECTILE DYSFUNCTION 01/06/2007  . HYPERGLYCEMIA, BORDERLINE 12/02/2009  . HYPERLIPIDEMIA 01/06/2007  . HYPERTENSION, BENIGN ESSENTIAL 01/06/2007  . HYPOTHYROIDISM 01/10/2008  . LIVER FUNCTION TESTS, ABNORMAL, HX OF 12/02/2009  . Other symptoms involving cardiovascular system 01/10/2009  . Rosacea 01/06/2007  . UNS ADVRS EFF OTH RX MEDICINAL&BIOLOGICAL SBSTNC 01/10/2008  . VITAMIN D DEFICIENCY 01/10/2008   Past Surgical History:  Procedure Laterality Date  . HERNIA REPAIR     bilateral inguinal   . TONSILLECTOMY     Social History   Socioeconomic History  . Marital status: Married    Spouse name: Not on file  . Number of children: Not on file  . Years of education: Not on file  . Highest education level: Not on file  Occupational History  . Not on file  Social Needs  . Financial resource strain: Not on file  . Food insecurity:    Worry: Not on file    Inability: Not on file  . Transportation needs:    Medical: Not on file    Non-medical: Not on file  Tobacco Use  . Smoking status: Never Smoker  . Smokeless tobacco: Never Used  Substance and Sexual Activity  . Alcohol use: Not on file  . Drug use: No  . Sexual activity: Not on file  Lifestyle  . Physical  activity:    Days per week: Not on file    Minutes per session: Not on file  . Stress: Not on file  Relationships  . Social connections:    Talks on phone: Not on file    Gets together: Not on file    Attends religious service: Not on file    Active member of club or organization: Not on file    Attends meetings of clubs or organizations: Not on file    Relationship status: Not on file  Other Topics Concern  . Not on file  Social History Narrative  . Not on file   No Known Allergies Family History  Problem Relation Age of Onset  . Hyperlipidemia Mother   . Diabetes Mother      Past medical history, social, surgical and family history all reviewed in electronic medical record.  No pertanent information unless stated regarding to the chief complaint.   Review of Systems:Review of systems updated and as accurate as of 08/23/17  No headache, visual changes, nausea, vomiting, diarrhea, constipation, dizziness, abdominal pain, skin rash, fevers, chills, night sweats, weight loss, swollen lymph nodes, body aches, joint swelling,chest pain, shortness of breath, mood changes.  Positive muscle aches  Objective  Blood pressure 124/74, pulse 80, height 5\' 8"  (1.727 m), weight 177 lb (80.3 kg), SpO2 98 %. Systems examined below as of  08/23/17   General: No apparent distress alert and oriented x3 mood and affect normal, dressed appropriately.  HEENT: Pupils equal, extraocular movements intact  Respiratory: Patient's speak in full sentences and does not appear short of breath  Cardiovascular: No lower extremity edema, non tender, no erythema  Skin: Warm dry intact with no signs of infection or rash on extremities or on axial skeleton.  Abdomen: Soft nontender  Neuro: Cranial nerves II through XII are intact, neurovascularly intact in all extremities with 2+ DTRs and 2+ pulses.  Lymph: No lymphadenopathy of posterior or anterior cervical chain or axillae bilaterally.  Gait normal with good  balance and coordination.  MSK:  Non tender with full range of motion and good stability and symmetric strength and tone of  elbows, wrist, hip, knee and ankles bilaterally.  Right shoulder exam does not have any significant atrophy.  Patient does have positive impingement.  4+ out of 5 strength in the right side compared to left side.  Still doing very well with full range of motion as well.  Procedure: Real-time Ultrasound Guided Injection of right glenohumeral joint Device: GE Logiq Q7  Ultrasound guided injection is preferred based studies that show increased duration, increased effect, greater accuracy, decreased procedural pain, increased response rate with ultrasound guided versus blind injection.  Verbal informed consent obtained.  Time-out conducted.  Noted no overlying erythema, induration, or other signs of local infection.  Skin prepped in a sterile fashion.  Local anesthesia: Topical Ethyl chloride.  With sterile technique and under real time ultrasound guidance:  Joint visualized.  23g 1  inch needle inserted posterior approach. Pictures taken for needle placement. Patient did have injection of 2 cc of 1% lidocaine, 2 cc of 0.5% Marcaine, and 1.0 cc of Kenalog 40 mg/dL. Completed without difficulty  Pain immediately resolved suggesting accurate placement of the medication.  Advised to call if fevers/chills, erythema, induration, drainage, or persistent bleeding.  Images permanently stored and available for review in the ultrasound unit.  Impression: Technically successful ultrasound guided injection.    Impression and Recommendations:     This case required medical decision making of moderate complexity.      Note: This dictation was prepared with Dragon dictation along with smaller phrase technology. Any transcriptional errors that result from this process are unintentional.

## 2017-08-23 ENCOUNTER — Ambulatory Visit: Payer: Self-pay

## 2017-08-23 ENCOUNTER — Ambulatory Visit: Payer: Medicare Other | Admitting: Family Medicine

## 2017-08-23 ENCOUNTER — Encounter: Payer: Self-pay | Admitting: Family Medicine

## 2017-08-23 VITALS — BP 124/74 | HR 80 | Ht 68.0 in | Wt 177.0 lb

## 2017-08-23 DIAGNOSIS — G8929 Other chronic pain: Secondary | ICD-10-CM | POA: Diagnosis not present

## 2017-08-23 DIAGNOSIS — M25511 Pain in right shoulder: Secondary | ICD-10-CM | POA: Diagnosis not present

## 2017-08-23 DIAGNOSIS — M75121 Complete rotator cuff tear or rupture of right shoulder, not specified as traumatic: Secondary | ICD-10-CM | POA: Diagnosis not present

## 2017-08-23 NOTE — Assessment & Plan Note (Signed)
Patient given injection today.  Discussed again with patient's tear I do feel that advanced imaging would be warranted.  Patient still doing better with the range of motion though at this moment.  Discussed icing regimen and topical anti-inflammatories.  Patient will follow-up in 4 weeks.

## 2017-08-23 NOTE — Patient Instructions (Signed)
Good to see you  Ice is your friend If this does not work long term we need MRI of the shoulder and just call me  See me again in 6 weeks otherwise

## 2017-10-01 NOTE — Progress Notes (Deleted)
[facility] [address] [place], [place] [address] Phone: [phone] Subjective:    I'm seeing this patient by the request  of:    CC:   [id number]  Kevin Wise is a 82 y.o. male coming in with complaint of ***  Onset-  Location Duration-  Character- Aggravating factors- Reliving factors-  Therapies tried-  Severity-     Past Medical History:  Diagnosis Date  . ANEMIA 01/10/2008  . ARTHRITIS 01/06/2007  . CAROTID ARTERY STENOSIS, RIGHT 01/10/2009  . Diabetes mellitus without complication (HCC)   . ERECTILE DYSFUNCTION 01/06/2007  . HYPERGLYCEMIA, BORDERLINE 12/02/2009  . HYPERLIPIDEMIA 01/06/2007  . HYPERTENSION, BENIGN ESSENTIAL 01/06/2007  . HYPOTHYROIDISM 01/10/2008  . LIVER FUNCTION TESTS, ABNORMAL, HX OF 12/02/2009  . Other symptoms involving cardiovascular system 01/10/2009  . Rosacea 01/06/2007  . UNS ADVRS EFF OTH RX MEDICINAL&BIOLOGICAL SBSTNC 01/10/2008  . VITAMIN D DEFICIENCY 01/10/2008   Past Surgical History:  Procedure Laterality Date  . HERNIA REPAIR     bilateral inguinal   . TONSILLECTOMY     Social History   Socioeconomic History  . Marital status: Married    Spouse name: Not on file  . Number of children: Not on file  . Years of education: Not on file  . Highest education level: Not on file  Occupational History  . Not on file  Social Needs  . Financial resource strain: Not on file  . Food insecurity:    Worry: Not on file    Inability: Not on file  . Transportation needs:    Medical: Not on file    Non-medical: Not on file  Tobacco Use  . Smoking status: Never Smoker  . Smokeless tobacco: Never Used  Substance and Sexual Activity  . Alcohol use: Not on file  . Drug use: No  . Sexual activity: Not on file  Lifestyle  . Physical activity:    Days per week: Not on file    Minutes per session: Not on file  . Stress: Not on file  Relationships  . Social connections:    Talks on phone:  Not on file    Gets together: Not on file    Attends religious service: Not on file    Active member of club or organization: Not on file    Attends meetings of clubs or organizations: Not on file    Relationship status: Not on file  Other Topics Concern  . Not on file  Social History Narrative  . Not on file   No Known Allergies Family History  Problem Relation Age of Onset  . Hyperlipidemia Mother   . Diabetes Mother      Past medical history, social, surgical and family history all reviewed in electronic medical record.  No pertanent information unless stated regarding to the chief complaint.   Review of Systems:Review of systems updated and as accurate as of 10/01/17  No headache, visual changes, nausea, vomiting, diarrhea, constipation, dizziness, abdominal pain, skin rash, fevers, chills, night sweats, weight loss, swollen lymph nodes, body aches, joint swelling, muscle aches, chest pain, shortness of breath, mood changes.   Objective  There were no vitals taken for this visit. Systems examined below as of 10/01/17   General: No apparent distress alert and oriented x3 mood and affect normal, dressed appropriately.  HEENT: Pupils equal, extraocular movements intact  Respiratory: Patient's speak in full sentences and does not appear short of breath  Cardiovascular: No lower extremity  edema, non tender, no erythema  Skin: Warm dry intact with no signs of infection or rash on extremities or on axial skeleton.  Abdomen: Soft nontender  Neuro: Cranial nerves II through XII are intact, neurovascularly intact in all extremities with 2+ DTRs and 2+ pulses.  Lymph: No lymphadenopathy of posterior or anterior cervical chain or axillae bilaterally.  Gait normal with good balance and coordination.  MSK:  Non tender with full range of motion and good stability and symmetric strength and tone of shoulders, elbows, wrist, hip, knee and ankles bilaterally.     Impression and  Recommendations:     This case required medical decision making of moderate complexity.      Note: This dictation was prepared with Dragon dictation along with smaller phrase technology. Any transcriptional errors that result from this process are unintentional.

## 2017-10-04 ENCOUNTER — Ambulatory Visit: Payer: Medicare Other | Admitting: Family Medicine

## 2017-10-11 ENCOUNTER — Encounter: Payer: Self-pay | Admitting: Family Medicine

## 2017-10-11 ENCOUNTER — Ambulatory Visit (INDEPENDENT_AMBULATORY_CARE_PROVIDER_SITE_OTHER): Payer: Medicare Other | Admitting: Family Medicine

## 2017-10-11 VITALS — BP 110/80 | HR 60 | Temp 98.3°F | Wt 176.4 lb

## 2017-10-11 DIAGNOSIS — I10 Essential (primary) hypertension: Secondary | ICD-10-CM | POA: Diagnosis not present

## 2017-10-11 DIAGNOSIS — E119 Type 2 diabetes mellitus without complications: Secondary | ICD-10-CM

## 2017-10-11 DIAGNOSIS — E785 Hyperlipidemia, unspecified: Secondary | ICD-10-CM

## 2017-10-11 LAB — POCT GLYCOSYLATED HEMOGLOBIN (HGB A1C): HEMOGLOBIN A1C: 6.4 % — AB (ref 4.0–5.6)

## 2017-10-11 MED ORDER — ATORVASTATIN CALCIUM 40 MG PO TABS: 40.0000 mg | ORAL_TABLET | Freq: Every day | ORAL | 2 refills | Status: DC

## 2017-10-11 MED ORDER — QUINAPRIL-HYDROCHLOROTHIAZIDE 20-12.5 MG PO TABS: 1.0000 | ORAL_TABLET | Freq: Two times a day (BID) | ORAL | 2 refills | Status: DC

## 2017-10-11 MED ORDER — METFORMIN HCL 850 MG PO TABS: ORAL_TABLET | ORAL | 2 refills | Status: DC

## 2017-10-11 NOTE — Progress Notes (Signed)
  Subjective:     Patient ID: Kevin Wise, male   DOB: 1935-09-18, 82 y.o.   MRN: [id number]  HPI Patient seen for medical follow-up.  Type 2 diabetes. Not monitoring sugars regularly. Slightly worse compliance with diet past few months. Last A1c 6.0%. Requesting refills of metformin. He does exercise fairly often  Hypertension treated with quinapril HCTZ. No headaches. No dizziness. No chest pains.  He has hyperlipidemia treated with Lipitor. Requesting refills. No myalgias. He had lipids done in February with physical  Past Medical History:  Diagnosis Date  . ANEMIA 01/10/2008  . ARTHRITIS 01/06/2007  . CAROTID ARTERY STENOSIS, RIGHT 01/10/2009  . Diabetes mellitus without complication (HCC)   . ERECTILE DYSFUNCTION 01/06/2007  . HYPERGLYCEMIA, BORDERLINE 12/02/2009  . HYPERLIPIDEMIA 01/06/2007  . HYPERTENSION, BENIGN ESSENTIAL 01/06/2007  . HYPOTHYROIDISM 01/10/2008  . LIVER FUNCTION TESTS, ABNORMAL, HX OF 12/02/2009  . Other symptoms involving cardiovascular system 01/10/2009  . Rosacea 01/06/2007  . UNS ADVRS EFF OTH RX MEDICINAL&BIOLOGICAL SBSTNC 01/10/2008  . VITAMIN D DEFICIENCY 01/10/2008   Past Surgical History:  Procedure Laterality Date  . HERNIA REPAIR     bilateral inguinal   . TONSILLECTOMY      reports that he has never smoked. He has never used smokeless tobacco. He reports that he does not use drugs. His alcohol history is not on file. family history includes Diabetes in his mother; Hyperlipidemia in his mother. No Known Allergies   Review of Systems  Constitutional: Negative for appetite change, fatigue and unexpected weight change.  Eyes: Negative for visual disturbance.  Respiratory: Negative for cough, chest tightness and shortness of breath.   Cardiovascular: Negative for chest pain, palpitations and leg swelling.  Endocrine: Negative for polydipsia and polyuria.  Genitourinary: Negative for dysuria.  Neurological: Negative for dizziness,  syncope, weakness, light-headedness and headaches.       Objective:   Physical Exam  Constitutional: He is oriented to person, place, and time. He appears well-developed and well-nourished.  HENT:  Right Ear: External ear normal.  Left Ear: External ear normal.  Mouth/Throat: Oropharynx is clear and moist.  Eyes: Pupils are equal, round, and reactive to light.  Neck: Neck supple. No thyromegaly present.  Cardiovascular: Normal rate and regular rhythm.  Pulmonary/Chest: Effort normal and breath sounds normal. No respiratory distress. He has no wheezes. He has no rales.  Musculoskeletal: He exhibits no edema.  Neurological: He is alert and oriented to person, place, and time.       Assessment:     #1 type 2 diabetes well controlled with A1c 6.4%  #2 hypertension stable and at goal  #3 dyslipidemia on Lipitor    Plan:     -Refilled Lipitor, quinapril HCTZ, and metformin -A1c as above. Continue low glycemic diet and regular exercise -Routine follow-up in 3 months. -Discuss flu vaccine. Patient wishes to wait until September or October for that  Kristian Covey MD [facility]

## 2017-10-20 NOTE — Progress Notes (Signed)
[name] [address] [place], [place] [address] Phone: [phone] Subjective:      CC: Right shoulder pain follow-up  TTS:[id number]  Kevin Wise is a 82 y.o. male coming in with complaint of right shoulder pain. Shoulder is about the same. Injection lasted for about 10 days.  Unfortunately continues to have daily pain.  Certain activities seem to cause more pain.  Feels like he cannot lift anything more than 15 pounds with the arm.  Can wake him up at night.  Has been seen previously and has had a rotator cuff tear now going on nearly 10 months.    Past Medical History:  Diagnosis Date  . ANEMIA 01/10/2008  . ARTHRITIS 01/06/2007  . CAROTID ARTERY STENOSIS, RIGHT 01/10/2009  . Diabetes mellitus without complication (HCC)   . ERECTILE DYSFUNCTION 01/06/2007  . HYPERGLYCEMIA, BORDERLINE 12/02/2009  . HYPERLIPIDEMIA 01/06/2007  . HYPERTENSION, BENIGN ESSENTIAL 01/06/2007  . HYPOTHYROIDISM 01/10/2008  . LIVER FUNCTION TESTS, ABNORMAL, HX OF 12/02/2009  . Other symptoms involving cardiovascular system 01/10/2009  . Rosacea 01/06/2007  . UNS ADVRS EFF OTH RX MEDICINAL&BIOLOGICAL SBSTNC 01/10/2008  . VITAMIN D DEFICIENCY 01/10/2008   Past Surgical History:  Procedure Laterality Date  . HERNIA REPAIR     bilateral inguinal   . TONSILLECTOMY     Social History   Socioeconomic History  . Marital status: Married    Spouse name: Not on file  . Number of children: Not on file  . Years of education: Not on file  . Highest education level: Not on file  Occupational History  . Not on file  Social Needs  . Financial resource strain: Not on file  . Food insecurity:    Worry: Not on file    Inability: Not on file  . Transportation needs:    Medical: Not on file    Non-medical: Not on file  Tobacco Use  . Smoking status: Never Smoker  . Smokeless tobacco: Never Used  Substance and Sexual Activity  . Alcohol use: Not on file  . Drug use: No   . Sexual activity: Not on file  Lifestyle  . Physical activity:    Days per week: Not on file    Minutes per session: Not on file  . Stress: Not on file  Relationships  . Social connections:    Talks on phone: Not on file    Gets together: Not on file    Attends religious service: Not on file    Active member of club or organization: Not on file    Attends meetings of clubs or organizations: Not on file    Relationship status: Not on file  Other Topics Concern  . Not on file  Social History Narrative  . Not on file   No Known Allergies Family History  Problem Relation Age of Onset  . Hyperlipidemia Mother   . Diabetes Mother     Current Outpatient Medications (Endocrine & Metabolic):  .  metFORMIN (GLUCOPHAGE) 850 MG tablet, TAKE 1 TABLET BY MOUTH TWICE A DAY WITH A MEAL  Current Outpatient Medications (Cardiovascular):  .  atorvastatin (LIPITOR) 40 MG tablet, Take 1 tablet (40 mg total) by mouth daily. .  quinapril-hydrochlorothiazide (ACCURETIC) 20-12.5 MG tablet, Take 1 tablet by mouth 2 (two) times daily.  Current Outpatient Medications (Respiratory):  .  fluticasone (FLONASE) 50 MCG/ACT nasal spray, INSERT 2 SPRAYS IN EACH NOSTRIL Wise DAY  Current Outpatient Medications (Analgesics):  .  aspirin 81 MG tablet, Take 81 mg by mouth daily.   .  traMADol (ULTRAM) 50 MG tablet, Take one to two tablets Wise 6 hours prn neck pain   Current Outpatient Medications (Other):  .  gabapentin (NEURONTIN) 100 MG capsule, Take 1 capsule (100 mg total) by mouth at bedtime. Marland Kitchen  glucose blood test strip, One Touch Delica. Check sugar 2 times daily. DX: 250.00 .  latanoprost (XALATAN) 0.005 % ophthalmic solution, PLACE 1 DROP IN BOTH EYES AT BEDTIME .  lidocaine-hydrocortisone (ANAMANTEL HC) 3-0.5 % CREA, Place 1 Applicatorful rectally 2 (two) times daily. .  ONE TOUCH LANCETS MISC, ONE TOUCH VERIO LANCETS. Check sugar 2 times  daily. DX: 250.00    Past medical history, social,  surgical and family history all reviewed in electronic medical record.  No pertanent information unless stated regarding to the chief complaint.   Review of Systems:  No headache, visual changes, nausea, vomiting, diarrhea, constipation, dizziness, abdominal pain, skin rash, fevers, chills, night sweats, weight loss, swollen lymph nodes, body aches, joint swelling, muscle aches, chest pain, shortness of breath, mood changes.   Objective  Blood pressure 110/72, pulse 80, height 5\' 8"  (1.727 m), weight 179 lb (81.2 kg), SpO2 94 %.    General: No apparent distress alert and oriented x3 mood and affect normal, dressed appropriately.  HEENT: Pupils equal, extraocular movements intact  Respiratory: Patient's speak in full sentences and does not appear short of breath  Cardiovascular: No lower extremity edema, non tender, no erythema  Skin: Warm dry intact with no signs of infection or rash on extremities or on axial skeleton.  Abdomen: Soft nontender  Neuro: Cranial nerves II through XII are intact, neurovascularly intact in all extremities with 2+ DTRs and 2+ pulses.  Lymph: No lymphadenopathy of posterior or anterior cervical chain or axillae bilaterally.  Gait normal with good balance and coordination.  MSK:  Non tender with full range of motion and good stability and symmetric strength and tone of  elbows, wrist, hip, knee and ankles bilaterally.  Shoulder: Right Inspection reveals no abnormalities, atrophy or asymmetry. Palpation diffusely tender ROM decrease in external and internal range of motion. Rotator cuff strength 4 out of 5 completely with no significant strength of the infraspinatus Positive impingement Speeds and Yergason's tests normal. Positive O'Brien's Positive painful arc No apprehension sign      Impression and Recommendations:     This case required medical decision making of moderate complexity. The above documentation has been reviewed and is accurate and  complete , DO       Note: This dictation was prepared with Dragon dictation along with smaller phrase technology. Any transcriptional errors that result from this process are unintentional.

## 2017-10-21 ENCOUNTER — Encounter: Payer: Self-pay | Admitting: Family Medicine

## 2017-10-21 ENCOUNTER — Ambulatory Visit: Payer: Medicare Other | Admitting: Family Medicine

## 2017-10-21 DIAGNOSIS — G8929 Other chronic pain: Secondary | ICD-10-CM

## 2017-10-21 DIAGNOSIS — M75121 Complete rotator cuff tear or rupture of right shoulder, not specified as traumatic: Secondary | ICD-10-CM

## 2017-10-21 DIAGNOSIS — M25511 Pain in right shoulder: Secondary | ICD-10-CM

## 2017-10-21 NOTE — Assessment & Plan Note (Signed)
Patient does have a rotator cuff tear and has failed all conservative therapy.  We discussed icing regimen and home exercise.  Discussed which activities to do which was to avoid.  Patient has done everything at this point I feel that advanced imaging including an MR arthrogram would be beneficial.  Patient would be a surgical candidate.  We have discussed other alternatives which included PRP.  We will discuss after the MRI  Spent  25 minutes with patient face-to-face and had greater than 50% of counseling including as described above in assessment and plan.

## 2017-10-21 NOTE — Patient Instructions (Signed)
Good to see you  Sorry you are hurting.  MRI of the right shoulder order  Call [phone] to schedule Would consider the possibility PRP and read handout.  Make an appointment maybe with me 1-2 days after the MRI and we will discuss options

## 2017-11-11 ENCOUNTER — Ambulatory Visit
Admission: RE | Admit: 2017-11-11 | Discharge: 2017-11-11 | Disposition: A | Discharge status: Home / Self Care | Payer: Medicare Other | Source: Ambulatory Visit | Attending: Family Medicine | Admitting: Family Medicine

## 2017-11-11 DIAGNOSIS — M25511 Pain in right shoulder: Principal | ICD-10-CM

## 2017-11-11 DIAGNOSIS — G8929 Other chronic pain: Secondary | ICD-10-CM

## 2017-11-11 MED ORDER — IOPAMIDOL (ISOVUE-M 200) INJECTION 41%
15.0000 mL | Freq: Once | INTRAMUSCULAR | Status: AC
  Administered 2017-11-11: 15 mL via INTRA_ARTICULAR

## 2017-11-16 ENCOUNTER — Telehealth: Payer: Self-pay

## 2017-11-16 ENCOUNTER — Other Ambulatory Visit: Payer: Self-pay

## 2017-11-16 DIAGNOSIS — G8929 Other chronic pain: Secondary | ICD-10-CM

## 2017-11-16 DIAGNOSIS — M25511 Pain in right shoulder: Principal | ICD-10-CM

## 2017-11-16 NOTE — Progress Notes (Unsigned)
amb  

## 2017-11-16 NOTE — Telephone Encounter (Signed)
Patient returned call about MRI results of his shoulder. Talked to Dr. Katrinka Blazing. Referred to [facility] for rotator cuff tear.

## 2017-11-16 NOTE — Telephone Encounter (Signed)
-----   Message from Judi Saa, DO sent at 11/12/2017  5:09 AM EDT ----- Please call patient.  Tell him fairly large rotator cuff tear noted this could be repaired with surgery if he would like.  You can tell him PRP is a possibility but he should discuss the possibility of surgery with a surgeon.

## 2017-11-18 ENCOUNTER — Ambulatory Visit: Payer: Medicare Other | Admitting: Family Medicine

## 2017-11-19 ENCOUNTER — Ambulatory Visit (INDEPENDENT_AMBULATORY_CARE_PROVIDER_SITE_OTHER): Payer: Medicare Other

## 2017-11-19 DIAGNOSIS — Z23 Encounter for immunization: Secondary | ICD-10-CM

## 2017-11-22 NOTE — Telephone Encounter (Signed)
Referral faxed

## 2017-11-22 NOTE — Telephone Encounter (Signed)
Pt called and states that because he lives in [facility] he has looked around and gotten in touch with Dr. Morey Hummingbird from [facility] - he wants to see him for the rotator cuff repair.  Dr. Samuel Bouche states that he needs all records sent over to fax number [phone]. Pt states he can be reached at [phone].

## 2017-11-25 ENCOUNTER — Telehealth: Payer: Self-pay

## 2017-11-25 NOTE — Telephone Encounter (Signed)
[  11/25/2017 8:41 AM]  Alexander Bergeron:   MRN: [id number] Eligha Kmetz, is asking to speak w/ you; pt states he's been trying for a while to speak w/ someone but no one will contact him back; are you able to assist   Received this message from a Patient engagement agent. Called patient and could not get in touch with him.

## 2017-11-25 NOTE — Telephone Encounter (Signed)
Patient returned my call. Wanted his medical records from Dr. Katrinka Blazing. Directed him to medical records and gave him their phone number.

## 2017-12-15 ENCOUNTER — Ambulatory Visit (INDEPENDENT_AMBULATORY_CARE_PROVIDER_SITE_OTHER): Payer: Medicare Other | Admitting: Family Medicine

## 2017-12-15 ENCOUNTER — Encounter: Payer: Self-pay | Admitting: Family Medicine

## 2017-12-15 ENCOUNTER — Other Ambulatory Visit: Payer: Self-pay

## 2017-12-15 VITALS — BP 114/68 | HR 82 | Temp 98.3°F | Ht 68.0 in | Wt 177.5 lb

## 2017-12-15 DIAGNOSIS — R1031 Right lower quadrant pain: Secondary | ICD-10-CM | POA: Diagnosis not present

## 2017-12-15 DIAGNOSIS — R1032 Left lower quadrant pain: Secondary | ICD-10-CM

## 2017-12-15 NOTE — Progress Notes (Signed)
  Subjective:     Patient ID: Kevin Wise, male   DOB: 1935/06/17, 82 y.o.   MRN: [id number]  HPI Patient seen with bilateral groin and upper thigh pain for the past couple weeks.  No pain with walking.  No injury.  No change in exercise regimen.  He walks and uses elliptical for exercise.  He had previous hernia repairs.  His pain is really not in the inguinal area but is down below the adductor region of both thighs.  He has not seen any swelling.  No rash.  He has noted pain mostly getting in and out of car.  Symptoms are somewhat improved over the past few days  Past Medical History:  Diagnosis Date  . ANEMIA 01/10/2008  . ARTHRITIS 01/06/2007  . CAROTID ARTERY STENOSIS, RIGHT 01/10/2009  . Diabetes mellitus without complication (HCC)   . ERECTILE DYSFUNCTION 01/06/2007  . HYPERGLYCEMIA, BORDERLINE 12/02/2009  . HYPERLIPIDEMIA 01/06/2007  . HYPERTENSION, BENIGN ESSENTIAL 01/06/2007  . HYPOTHYROIDISM 01/10/2008  . LIVER FUNCTION TESTS, ABNORMAL, HX OF 12/02/2009  . Other symptoms involving cardiovascular system 01/10/2009  . Rosacea 01/06/2007  . UNS ADVRS EFF OTH RX MEDICINAL&BIOLOGICAL SBSTNC 01/10/2008  . VITAMIN D DEFICIENCY 01/10/2008   Past Surgical History:  Procedure Laterality Date  . HERNIA REPAIR     bilateral inguinal   . TONSILLECTOMY      reports that he has never smoked. He has never used smokeless tobacco. He reports that he does not use drugs. His alcohol history is not on file. family history includes Diabetes in his mother; Hyperlipidemia in his mother. No Known Allergies   Review of Systems  Gastrointestinal: Negative for abdominal pain.  Genitourinary: Negative for dysuria.  Neurological: Negative for weakness and numbness.  Hematological: Negative for adenopathy.       Objective:   Physical Exam  Constitutional: He appears well-developed and well-nourished.  Cardiovascular: Normal rate and regular rhythm.  Pulmonary/Chest: Effort normal and  breath sounds normal.  Musculoskeletal: He exhibits no edema.  Inguinal region examined.  He has no significant inguinal adenopathy.  No recurrent hernia.  No localized tenderness in the adductor or groin region.  No rash.  No swelling.  No ecchymosis.  No pain with hip flexion against resistance       Assessment:     Bilateral upper thigh pain which has been relatively mild.  Suspect muscular.  Not reproducible on exam.  Symptoms seem to be improving    Plan:     -Recommend observation for now.  Follow-up promptly for any swelling, worsening pain or other concerns.  He has follow-up November 11 and reassess then  Kristian Covey MD [facility] at [facility]

## 2017-12-15 NOTE — Patient Instructions (Signed)
Watch for any swelling or other changes.   We will reassess at Nov 11 visit.  Continue with walking and exercise as tolerated.

## 2017-12-21 ENCOUNTER — Other Ambulatory Visit: Payer: Self-pay

## 2017-12-21 ENCOUNTER — Telehealth: Payer: Self-pay

## 2017-12-21 MED ORDER — PREDNISONE 10 MG PO TABS: ORAL_TABLET | ORAL | 0 refills | Status: DC

## 2017-12-21 NOTE — Telephone Encounter (Signed)
Called patient and let him know that this has been sent to the pharmacy for the patient.  Patient verbalized an understanding and stated that he may wait a day or 2 before taking this medication.

## 2017-12-21 NOTE — Telephone Encounter (Signed)
Would be cautious with prednisone with his diabetes.  Agree to trial of low dose prednisone taper as follows:  Prednisone 10 mg taper as follows:4-4-4-3-3-2-2-1-1    #24  Needs follow up if no better after that.

## 2017-12-21 NOTE — Telephone Encounter (Signed)
Called patient and he stated that his thigh pain was not getting any better and requested to try a Prednisone Dose Pak sent to [facility].  Please advise.  Copied from CRM [id number]. Topic: General - Other >> Dec 21, 2017 11:55 AM Marylen Ponto wrote:  Reason for CRM: Pt states he needs to speak with Dr Lucie Leather nurse Aundra Millet about some follow up medications pertaining to his office visit last week. Pt requests call back. Cb# [phone]

## 2018-01-03 ENCOUNTER — Ambulatory Visit: Payer: Medicare Other | Admitting: Family Medicine

## 2018-01-10 ENCOUNTER — Other Ambulatory Visit: Payer: Self-pay

## 2018-01-10 ENCOUNTER — Ambulatory Visit (INDEPENDENT_AMBULATORY_CARE_PROVIDER_SITE_OTHER): Payer: Medicare Other

## 2018-01-10 ENCOUNTER — Encounter: Payer: Self-pay | Admitting: Family Medicine

## 2018-01-10 ENCOUNTER — Ambulatory Visit (INDEPENDENT_AMBULATORY_CARE_PROVIDER_SITE_OTHER): Payer: Medicare Other | Admitting: Family Medicine

## 2018-01-10 VITALS — BP 128/76 | HR 72 | Temp 97.8°F | Ht 68.0 in | Wt 175.3 lb

## 2018-01-10 DIAGNOSIS — M25551 Pain in right hip: Secondary | ICD-10-CM

## 2018-01-10 DIAGNOSIS — M25552 Pain in left hip: Secondary | ICD-10-CM

## 2018-01-10 DIAGNOSIS — E119 Type 2 diabetes mellitus without complications: Secondary | ICD-10-CM | POA: Diagnosis not present

## 2018-01-10 LAB — POCT GLYCOSYLATED HEMOGLOBIN (HGB A1C): Hemoglobin A1C: 6.4 % — AB (ref 4.0–5.6)

## 2018-01-10 NOTE — Progress Notes (Signed)
  Subjective:     Patient ID: Kevin Wise, male   DOB: 22-Apr-1935, 82 y.o.   MRN: [id number]  HPI Patient here for the following  Type 2 diabetes.  History of good control.  Remains on metformin.  No polyuria or polydipsia.  Appetite and weight stable.  Patient was seen recently with bilateral groin pain.  Symptoms essentially unchanged.  We had written for some low-dose prednisone but he never started.  Pain is no worse but no better.  No significant pain with walking.  Right groin and hip slightly worse than left.  No injury.  Denies any fever, chills, night sweats, adenopathy.  He has had prior inguinal hernia repairs and wonders if there may be some kind of problem with his mesh.  Past Medical History:  Diagnosis Date  . ANEMIA 01/10/2008  . ARTHRITIS 01/06/2007  . CAROTID ARTERY STENOSIS, RIGHT 01/10/2009  . Diabetes mellitus without complication (HCC)   . ERECTILE DYSFUNCTION 01/06/2007  . HYPERGLYCEMIA, BORDERLINE 12/02/2009  . HYPERLIPIDEMIA 01/06/2007  . HYPERTENSION, BENIGN ESSENTIAL 01/06/2007  . HYPOTHYROIDISM 01/10/2008  . LIVER FUNCTION TESTS, ABNORMAL, HX OF 12/02/2009  . Other symptoms involving cardiovascular system 01/10/2009  . Rosacea 01/06/2007  . UNS ADVRS EFF OTH RX MEDICINAL&BIOLOGICAL SBSTNC 01/10/2008  . VITAMIN D DEFICIENCY 01/10/2008   Past Surgical History:  Procedure Laterality Date  . HERNIA REPAIR     bilateral inguinal   . TONSILLECTOMY      reports that he has never smoked. He has never used smokeless tobacco. He reports that he does not use drugs. His alcohol history is not on file. family history includes Diabetes in his mother; Hyperlipidemia in his mother. No Known Allergies   Review of Systems  Constitutional: Negative for appetite change, chills, fever and unexpected weight change.  Respiratory: Negative for shortness of breath.   Cardiovascular: Negative for chest pain.  Gastrointestinal: Negative for abdominal pain.   Genitourinary: Negative for dysuria.  Musculoskeletal: Negative for back pain.  Hematological: Negative for adenopathy. Does not bruise/bleed easily.       Objective:   Physical Exam  Constitutional: He is oriented to person, place, and time. He appears well-developed and well-nourished.  Cardiovascular: Normal rate and regular rhythm.  Pulmonary/Chest: Effort normal and breath sounds normal.  Musculoskeletal: He exhibits no edema.  Only minimal pain with internal and external rotation of the right hip No inguinal adenopathy noted.  No evidence for recurrent hernia.  Good distal pulses.  Neurological: He is alert and oriented to person, place, and time.       Assessment:     #1 type 2 diabetes well-controlled with A1c today 6.4%  #2 bilateral inguinal pain.  Question related to scar tissue (from prior hernia repair) versus other soft tissue.    Plan:     -Obtain x-ray right hip and pelvis to further investigate -Continue low glycemic diet -Patient will schedule follow-up in February for physical  Kristian Covey MD [facility] at [facility]

## 2018-01-13 ENCOUNTER — Telehealth: Payer: Self-pay | Admitting: Family Medicine

## 2018-01-13 NOTE — Telephone Encounter (Signed)
Copied from CRM [id number]. Topic: Quick Communication - Lab Results (Clinic Use ONLY) >> Jan 13, 2018 11:01 AM Jobe Gibbon, CMA wrote: Called patient to inform them of their lab results. When patient returns call, triage nurse may disclose results. >> Jan 13, 2018  3:52 PM Laural Benes, [place] C wrote: Pt is returning the office call for results. Pt declined speaking with a nurse at the [facility] stating that he would rather speak with Dr. [name] assistant directly. Pt says that he will be home and available the rest of the day today.   CB: [phone]

## 2018-01-14 NOTE — Telephone Encounter (Signed)
Pt given xray results and is now asking for recommendations to treat his pain. He reports pain in both his hips and would like to know if you could prescribe some pain medication for him.

## 2018-01-14 NOTE — Telephone Encounter (Signed)
I left a message for the pt to return my call.  CRM also created. 

## 2018-01-14 NOTE — Telephone Encounter (Signed)
He should go ahead and start the prednisone we have already prescribed to see if that helps.

## 2018-02-08 ENCOUNTER — Other Ambulatory Visit: Payer: Self-pay | Admitting: Family Medicine

## 2018-02-09 ENCOUNTER — Other Ambulatory Visit: Payer: Self-pay | Admitting: Family Medicine

## 2018-02-10 ENCOUNTER — Telehealth: Payer: Self-pay | Admitting: Family Medicine

## 2018-02-10 ENCOUNTER — Other Ambulatory Visit: Payer: Self-pay | Admitting: Family Medicine

## 2018-02-10 NOTE — Telephone Encounter (Signed)
Copied from CRM [id number]. Topic: General - Other >> Feb 10, 2018  8:53 AM Percival Spanish wrote: The request  was refused and pt call to ask why   Pt would like to know if he can have a refill on Prednisone the issue he said  is still there and the medicine worked for him.    Pharmacy [facility] [address]

## 2018-02-11 NOTE — Telephone Encounter (Signed)
Prednisone is not currently on med list.  Please see message. Please advise.

## 2018-02-11 NOTE — Telephone Encounter (Signed)
Last OV 01/10/18, Next OV 04/18/18  Not on current med list? Please advise.

## 2018-02-13 NOTE — Telephone Encounter (Signed)
Not sure why he is requesting?  This is not a regular medication.  Decline.

## 2018-02-22 ENCOUNTER — Ambulatory Visit: Payer: Medicare Other | Admitting: Family Medicine

## 2018-02-22 ENCOUNTER — Ambulatory Visit (INDEPENDENT_AMBULATORY_CARE_PROVIDER_SITE_OTHER): Payer: Medicare Other | Admitting: Family Medicine

## 2018-02-22 ENCOUNTER — Other Ambulatory Visit: Payer: Self-pay

## 2018-02-22 ENCOUNTER — Ambulatory Visit (INDEPENDENT_AMBULATORY_CARE_PROVIDER_SITE_OTHER): Payer: Medicare Other

## 2018-02-22 ENCOUNTER — Encounter: Payer: Self-pay | Admitting: Family Medicine

## 2018-02-22 VITALS — BP 112/70 | HR 85 | Temp 98.4°F | Ht 68.0 in | Wt 170.9 lb

## 2018-02-22 DIAGNOSIS — E119 Type 2 diabetes mellitus without complications: Secondary | ICD-10-CM | POA: Diagnosis not present

## 2018-02-22 DIAGNOSIS — L299 Pruritus, unspecified: Secondary | ICD-10-CM

## 2018-02-22 DIAGNOSIS — M25552 Pain in left hip: Secondary | ICD-10-CM

## 2018-02-22 MED ORDER — TRIAMCINOLONE ACETONIDE 0.1 % EX CREA: TOPICAL_CREAM | CUTANEOUS | 1 refills | Status: DC

## 2018-02-22 MED ORDER — PREDNISONE 10 MG PO TABS: ORAL_TABLET | ORAL | 0 refills | Status: DC

## 2018-02-22 NOTE — Progress Notes (Signed)
Subjective:     Patient ID: Kevin Wise, male   DOB: 24-Feb-1935, 82 y.o.   MRN: [id number]  HPI Patient is seen with pain mostly left anterior hip region.  Had been seen back in late October with somewhat bilateral groin but mostly right upper thigh pain.  We ended up getting x-ray of the right hip at that time which showed some mild degenerative changes but no evidence for any acute pelvic abnormality.  He now is complaining of left-sided pain anterior to the left hip.  He denies any fever, chills, night sweats, or any appetite or weight changes.  Denies low back pain.  No skin rash.  Symptoms are frequently worse when he first gets up to walk.  He does have some stiffness.  He has had previous inguinal hernia repairs but this pain is somewhat more inferior.  He denies any upper back pain.  No associated numbness or weakness  Patient states the only thing that has helped his pain has been prednisone.  He is requesting refill.  He does have type 2 diabetes though this has been very well controlled.  He also complains of pruritus involving his anterior and posterior trunk.  He has not seen a definite rash.  He has somewhat dry skin.  He uses usually Dial soap.  He has not tried any consistent topicals.  Past Medical History:  Diagnosis Date  . ANEMIA 01/10/2008  . ARTHRITIS 01/06/2007  . CAROTID ARTERY STENOSIS, RIGHT 01/10/2009  . Diabetes mellitus without complication (HCC)   . ERECTILE DYSFUNCTION 01/06/2007  . HYPERGLYCEMIA, BORDERLINE 12/02/2009  . HYPERLIPIDEMIA 01/06/2007  . HYPERTENSION, BENIGN ESSENTIAL 01/06/2007  . HYPOTHYROIDISM 01/10/2008  . LIVER FUNCTION TESTS, ABNORMAL, HX OF 12/02/2009  . Other symptoms involving cardiovascular system 01/10/2009  . Rosacea 01/06/2007  . UNS ADVRS EFF OTH RX MEDICINAL&BIOLOGICAL SBSTNC 01/10/2008  . VITAMIN D DEFICIENCY 01/10/2008   Past Surgical History:  Procedure Laterality Date  . HERNIA REPAIR     bilateral inguinal   .  TONSILLECTOMY      reports that he has never smoked. He has never used smokeless tobacco. He reports that he does not use drugs. No history on file for alcohol. family history includes Diabetes in his mother; Hyperlipidemia in his mother. No Known Allergies   Review of Systems  Constitutional: Negative for chills and fever.  Respiratory: Negative for shortness of breath.   Cardiovascular: Negative for chest pain.  Gastrointestinal: Negative for abdominal pain.  Musculoskeletal: Negative for back pain.  Skin: Positive for rash.  Neurological: Negative for dizziness, weakness and numbness.       Objective:   Physical Exam Constitutional:      Appearance: Normal appearance.  Cardiovascular:     Rate and Rhythm: Normal rate and regular rhythm.  Pulmonary:     Effort: Pulmonary effort is normal.     Breath sounds: Normal breath sounds.  Genitourinary:    Comments: Left inguinal region examined.  No hernia.  No significant adenopathy.  No visible swelling. Musculoskeletal:        General: No swelling.     Comments: Has full range of motion left hip.  No point tenderness to palpation.  Does have some mild pain involving his proximal quadriceps with hip flexion against resistance  Skin:    Comments: He has slightly dry skin involving the trunk but no specific rash seen.  He has multiple scattered seborrheic keratoses on the back  Neurological:     Mental Status:  He is alert.     Comments: Strength throughout lower extremities.  Symmetric reflexes.        Assessment:     #1 left anterior hip pain.  Doubt osteoarthritis related.  Suspect musculoskeletal -question muscular strain.  He does not describe any radiculitis type symptoms.  No focal weakness.  #2 pruritic skin condition.  He has somewhat dry skin which may be contributing  #3 controlled type 2 diabetes    Plan:     -Consider change from Dial soap to gentler soap such as Dove -Avoid prolonged bathing and also avoid  hot showers -Triamcinolone 0.1% cream compound 1-1 with Eucerin and use once or twice daily as needed -We agreed to prednisone taper but watch blood sugars closely -Obtain x-rays left hip to further evaluate -Consider orthopedic referral if left anterior hip pain persists -He has scheduled follow-up in February to reassess his chronic medical problems  Kristian Covey MD [facility] at [facility]

## 2018-02-22 NOTE — Patient Instructions (Signed)
Avoid prolonged showers and also avoid increased heat.  Apply moisturizer after getting out of shower.

## 2018-03-21 ENCOUNTER — Telehealth: Payer: Self-pay | Admitting: Family Medicine

## 2018-03-21 MED ORDER — TRIAMCINOLONE ACETONIDE 0.1 % EX CREA: 1.0000 "application " | TOPICAL_CREAM | Freq: Two times a day (BID) | CUTANEOUS | 1 refills | Status: DC

## 2018-03-21 NOTE — Telephone Encounter (Signed)
Send in triamcinolone cream 0.1% 454 grams with one refill.

## 2018-03-21 NOTE — Telephone Encounter (Signed)
Copied from CRM [id number]. Topic: Quick Communication - Rx Refill/Question >> Mar 21, 2018 12:36 PM Lyn Hollingshead, [facility] wrote: Medication:  triamcinolone cream (KENALOG) 0.1 %   When this was printed for pt directions stated to mix with eucerin cream.  Pt states that insurance will not cover to have it mixed.  Pt needs just the straight RX sent in and pharmacist will show him what to mix it with himself.  Has the patient contacted their pharmacy? yes (Agent: If no, request that the patient contact the pharmacy for the refill.) (Agent: If yes, when and what did the pharmacy advise?)  Preferred Pharmacy (with phone number or street name):   [id number] [place], [place] - [address] AT [address]OSS [address] [phone] (Phone) [phone] (Fax)  Agent: Please be advised that RX refills may take up to 3 business days. We ask that you follow-up with your pharmacy.

## 2018-03-21 NOTE — Telephone Encounter (Signed)
Please see message. °

## 2018-03-21 NOTE — Telephone Encounter (Signed)
Patient want's Kevin Wise to know he really needs the medication today because the itch is very bad. I advised him Dr. [name] agreed to send and he should get it today or tomorrow.

## 2018-03-21 NOTE — Telephone Encounter (Signed)
Patient called back to speak with [facility] please call back

## 2018-03-21 NOTE — Telephone Encounter (Signed)
Rx sent to pharmacy Pt aware that he can go and pick this up.  Nothing further needed.

## 2018-03-28 LAB — HM DIABETES EYE EXAM

## 2018-04-04 ENCOUNTER — Other Ambulatory Visit: Payer: Self-pay | Admitting: Family Medicine

## 2018-04-05 ENCOUNTER — Other Ambulatory Visit: Payer: Self-pay | Admitting: Family Medicine

## 2018-04-06 ENCOUNTER — Telehealth: Payer: Self-pay

## 2018-04-06 NOTE — Telephone Encounter (Signed)
Let's do the following:  Have him follow up on Friday with me.  I want to get ESR and CRP and we will discuss placing on low dose prednisone until we get an answer.

## 2018-04-06 NOTE — Telephone Encounter (Signed)
He was prescribed prednisone at last office visit.  I have not received any requests for refill since then.

## 2018-04-06 NOTE — Telephone Encounter (Signed)
We sent him a taper pack in December 2019. Please advise if a refill is OK or let him know that he should be finished with this medication.

## 2018-04-06 NOTE — Telephone Encounter (Signed)
Copied from CRM [id number]. Topic: General - Other >> Apr 06, 2018  8:25 AM Percival Spanish wrote:  Pt call to ask  why the below med was refused    predniSONE (DELTASONE) 10 MG tablet

## 2018-04-06 NOTE — Telephone Encounter (Signed)
Called patient and he wants Dr. [name] to know that he has pain all of the time now in his joints and the prednisone is the only thing that helped him. He can hardly sleep at night and all of the other things he has tried do not touch his pain and discomfort, only the prednisone works.  Please advise.

## 2018-04-06 NOTE — Telephone Encounter (Signed)
Called patient and was NOT abe to LMOVM to return call  Ok for [facility] to Discuss results / PCP / recommendations / Schedule patient  Need to know why patient is wanting this medication again. This was a dose pack and need to know what symptoms he is having if he calls back.  CRM Created.

## 2018-04-07 NOTE — Telephone Encounter (Signed)
I left a message for the pt to return my call.  CRM also created. 

## 2018-04-08 ENCOUNTER — Ambulatory Visit (INDEPENDENT_AMBULATORY_CARE_PROVIDER_SITE_OTHER): Payer: Medicare Other

## 2018-04-08 ENCOUNTER — Ambulatory Visit (INDEPENDENT_AMBULATORY_CARE_PROVIDER_SITE_OTHER): Payer: Medicare Other | Admitting: Family Medicine

## 2018-04-08 ENCOUNTER — Encounter: Payer: Self-pay | Admitting: Family Medicine

## 2018-04-08 VITALS — BP 140/62 | HR 95 | Temp 98.2°F | Wt 174.4 lb

## 2018-04-08 DIAGNOSIS — M25431 Effusion, right wrist: Secondary | ICD-10-CM

## 2018-04-08 DIAGNOSIS — E119 Type 2 diabetes mellitus without complications: Secondary | ICD-10-CM | POA: Diagnosis not present

## 2018-04-08 DIAGNOSIS — M2550 Pain in unspecified joint: Secondary | ICD-10-CM

## 2018-04-08 LAB — SEDIMENTATION RATE: Sed Rate: 74 mm/hr — ABNORMAL HIGH (ref 0–20)

## 2018-04-08 LAB — C-REACTIVE PROTEIN: CRP: 8.8 mg/dL (ref 0.5–20.0)

## 2018-04-08 MED ORDER — PREDNISONE 10 MG PO TABS: ORAL_TABLET | ORAL | 0 refills | Status: DC

## 2018-04-08 NOTE — Progress Notes (Signed)
Subjective:     Patient ID: Kevin Wise, male   DOB: [birth year], 83 y.o.   MRN: [id number]  HPI Patient is seen with multiple arthralgias.  Has had some ongoing bilateral hip issues that have been somewhat asymmetric (R > L).    Has also had some chronic left shoulder pain.  Regarding his hip pain he has had dramatic improvement with prednisone.  He had tried multiple over-the-counter medication such as Tylenol, Excedrin, ibuprofen without any relief.  Even though his symptoms did not sound classic for polymyalgia rheumatica we decided to have him come in today to check some screening labs.  He is also complaining of some pain in his knees and hands as well and multiple joints.  He does have some visible edema of the right hand and wrist today that we have not noted previously.  He is not noted any warmth.  No recent skin rashes.  No nodules.  No fatigue. Appetite and weight are stable.    Past Medical History:  Diagnosis Date  . ANEMIA 01/10/2008  . ARTHRITIS 01/06/2007  . CAROTID ARTERY STENOSIS, RIGHT 01/10/2009  . Diabetes mellitus without complication (HCC)   . ERECTILE DYSFUNCTION 01/06/2007  . HYPERGLYCEMIA, BORDERLINE 12/02/2009  . HYPERLIPIDEMIA 01/06/2007  . HYPERTENSION, BENIGN ESSENTIAL 01/06/2007  . HYPOTHYROIDISM 01/10/2008  . LIVER FUNCTION TESTS, ABNORMAL, HX OF 12/02/2009  . Other symptoms involving cardiovascular system 01/10/2009  . Rosacea 01/06/2007  . UNS ADVRS EFF OTH RX MEDICINAL&BIOLOGICAL SBSTNC 01/10/2008  . VITAMIN D DEFICIENCY 01/10/2008   Past Surgical History:  Procedure Laterality Date  . HERNIA REPAIR     bilateral inguinal   . TONSILLECTOMY      reports that he has never smoked. He has never used smokeless tobacco. He reports that he does not use drugs. No history on file for alcohol. family history includes Diabetes in his mother; Hyperlipidemia in his mother. No Known Allergies   Review of Systems  Constitutional: Negative for chills and  fever.  Respiratory: Negative for shortness of breath.   Cardiovascular: Negative for chest pain.  Gastrointestinal: Negative for abdominal pain.  Musculoskeletal: Positive for arthralgias.  Hematological: Negative for adenopathy. Does not bruise/bleed easily.       Objective:   Physical Exam Constitutional:      Appearance: He is well-developed.  HENT:     Right Ear: External ear normal.     Left Ear: External ear normal.  Eyes:     Pupils: Pupils are equal, round, and reactive to light.  Neck:     Musculoskeletal: Neck supple.     Thyroid: No thyromegaly.  Cardiovascular:     Rate and Rhythm: Normal rate and regular rhythm.  Pulmonary:     Effort: Pulmonary effort is normal. No respiratory distress.     Breath sounds: Normal breath sounds. No wheezing or rales.  Musculoskeletal:     Comments: Patient has some mild diffuse edema of the right hand and wrist.  Minimal if any warmth.  Full range of motion.  No localized bony tenderness.  Neurological:     Mental Status: He is alert and oriented to person, place, and time.        Assessment:     #1 diffuse arthralgias which have been responsive to prednisone but little else.  Clinically, doubt polymyalgia rheumatica.  He has new finding today of some visible swelling right hand and wrist.  No history of known gout or pseudogout  #2 history of type 2 diabetes-controlled.  Plan:     -Obtain x-rays right wrist -Check labs with sed rate, C-reactive protein, antinuclear antibody, CCP antibody, rheumatoid factor -refilled prednisone once. -monitor blood sugars regularly.    Kristian Covey MD [facility] at [facility]

## 2018-04-11 ENCOUNTER — Other Ambulatory Visit: Payer: Self-pay

## 2018-04-11 DIAGNOSIS — M059 Rheumatoid arthritis with rheumatoid factor, unspecified: Secondary | ICD-10-CM

## 2018-04-11 LAB — CYCLIC CITRUL PEPTIDE ANTIBODY, IGG

## 2018-04-11 LAB — ANTI-NUCLEAR AB-TITER (ANA TITER)

## 2018-04-11 LAB — ANA: Anti Nuclear Antibody(ANA): POSITIVE — AB

## 2018-04-11 LAB — RHEUMATOID FACTOR: Rhuematoid fact SerPl-aCnc: 23 IU/mL — ABNORMAL HIGH (ref <14)

## 2018-04-18 ENCOUNTER — Other Ambulatory Visit: Payer: Self-pay

## 2018-04-18 ENCOUNTER — Encounter: Payer: Self-pay | Admitting: Family Medicine

## 2018-04-18 ENCOUNTER — Ambulatory Visit (INDEPENDENT_AMBULATORY_CARE_PROVIDER_SITE_OTHER): Payer: Medicare Other | Admitting: Family Medicine

## 2018-04-18 VITALS — BP 118/74 | HR 68 | Temp 97.9°F | Ht 66.5 in | Wt 165.6 lb

## 2018-04-18 DIAGNOSIS — Z0000 Encounter for general adult medical examination without abnormal findings: Secondary | ICD-10-CM

## 2018-04-18 LAB — CBC WITH DIFFERENTIAL/PLATELET
BASOS ABS: 0 10*3/uL (ref 0.0–0.1)
Basophils Relative: 0.2 % (ref 0.0–3.0)
Eosinophils Absolute: 0.2 10*3/uL (ref 0.0–0.7)
Eosinophils Relative: 1.1 % (ref 0.0–5.0)
HEMATOCRIT: 35.8 % — AB (ref 39.0–52.0)
Hemoglobin: 11.7 g/dL — ABNORMAL LOW (ref 13.0–17.0)
LYMPHS PCT: 31 % (ref 12.0–46.0)
Lymphs Abs: 4.8 10*3/uL — ABNORMAL HIGH (ref 0.7–4.0)
MCHC: 32.8 g/dL (ref 30.0–36.0)
MCV: 91.8 fl (ref 78.0–100.0)
MONOS PCT: 8.6 % (ref 3.0–12.0)
Monocytes Absolute: 1.3 10*3/uL — ABNORMAL HIGH (ref 0.1–1.0)
Neutro Abs: 9.1 10*3/uL — ABNORMAL HIGH (ref 1.4–7.7)
Neutrophils Relative %: 59.1 % (ref 43.0–77.0)
Platelets: 462 10*3/uL — ABNORMAL HIGH (ref 150.0–400.0)
RBC: 3.9 Mil/uL — AB (ref 4.22–5.81)
RDW: 14.4 % (ref 11.5–15.5)
WBC: 15.5 10*3/uL — AB (ref 4.0–10.5)

## 2018-04-18 LAB — HEPATIC FUNCTION PANEL
ALT: 21 U/L (ref 0–53)
AST: 16 U/L (ref 0–37)
Albumin: 3.9 g/dL (ref 3.5–5.2)
Alkaline Phosphatase: 55 U/L (ref 39–117)
BILIRUBIN DIRECT: 0.1 mg/dL (ref 0.0–0.3)
TOTAL PROTEIN: 6.5 g/dL (ref 6.0–8.3)
Total Bilirubin: 0.4 mg/dL (ref 0.2–1.2)

## 2018-04-18 LAB — BASIC METABOLIC PANEL
BUN: 22 mg/dL (ref 6–23)
CALCIUM: 9.6 mg/dL (ref 8.4–10.5)
CO2: 31 meq/L (ref 19–32)
CREATININE: 1.13 mg/dL (ref 0.40–1.50)
Chloride: 101 mEq/L (ref 96–112)
GFR: 75.5 mL/min (ref >60.00)
GLUCOSE: 113 mg/dL — AB (ref 70–99)
Potassium: 4.3 mEq/L (ref 3.5–5.1)
Sodium: 140 mEq/L (ref 135–145)

## 2018-04-18 LAB — LIPID PANEL
CHOL/HDL RATIO: 3
Cholesterol: 120 mg/dL (ref 0–200)
HDL: 46.3 mg/dL (ref >39.00)
LDL Cholesterol: 52 mg/dL (ref 0–99)
NONHDL: 73.73
Triglycerides: 108 mg/dL (ref 0.0–149.0)
VLDL: 21.6 mg/dL (ref 0.0–40.0)

## 2018-04-18 LAB — TSH: TSH: 0.61 u[IU]/mL (ref 0.35–4.50)

## 2018-04-18 NOTE — Progress Notes (Signed)
Subjective:     Patient ID: Kevin Wise, male   DOB: [birth year], 83 y.o.   MRN: [id number]  HPI Patient is seen for physical exam.  He has had significant polyarthralgias recently we obtained labs which came back significant for sed rate of 74, positive ANA but low titer and rheumatoid factor which was elevated at 23.  Negative CCP antibody.  He has been on prednisone with 3 separate day rounds each time has had dramatic improvements in his symptoms.  He does not have any significant myalgias  He has type 2 diabetes which is been well controlled.  His blood sugars have consistently been less than 130 fasting even on prednisone.  He has been very diligent with scaling back sugars.  He has had flu vaccine.  He had shingles vaccine both the old and the new.  Both pneumonia vaccines already given.  Past Medical History:  Diagnosis Date  . ANEMIA 01/10/2008  . ARTHRITIS 01/06/2007  . CAROTID ARTERY STENOSIS, RIGHT 01/10/2009  . Diabetes mellitus without complication (HCC)   . ERECTILE DYSFUNCTION 01/06/2007  . HYPERGLYCEMIA, BORDERLINE 12/02/2009  . HYPERLIPIDEMIA 01/06/2007  . HYPERTENSION, BENIGN ESSENTIAL 01/06/2007  . HYPOTHYROIDISM 01/10/2008  . LIVER FUNCTION TESTS, ABNORMAL, HX OF 12/02/2009  . Other symptoms involving cardiovascular system 01/10/2009  . Rosacea 01/06/2007  . UNS ADVRS EFF OTH RX MEDICINAL&BIOLOGICAL SBSTNC 01/10/2008  . VITAMIN D DEFICIENCY 01/10/2008   Past Surgical History:  Procedure Laterality Date  . HERNIA REPAIR     bilateral inguinal   . TONSILLECTOMY      reports that he has never smoked. He has never used smokeless tobacco. He reports that he does not use drugs. No history on file for alcohol. family history includes Diabetes in his mother; Hyperlipidemia in his mother. No Known Allergies   Review of Systems  Constitutional: Negative for activity change, appetite change, fatigue and fever.  HENT: Negative for congestion, ear pain and trouble  swallowing.   Eyes: Negative for pain and visual disturbance.  Respiratory: Negative for cough, shortness of breath and wheezing.   Cardiovascular: Negative for chest pain and palpitations.  Gastrointestinal: Negative for abdominal distention, abdominal pain, blood in stool, constipation, diarrhea, nausea, rectal pain and vomiting.  Genitourinary: Negative for dysuria, hematuria and testicular pain.  Musculoskeletal: Positive for arthralgias. Negative for joint swelling.  Skin: Negative for rash.  Neurological: Negative for dizziness, syncope and headaches.  Hematological: Negative for adenopathy.  Psychiatric/Behavioral: Negative for confusion and dysphoric mood.       Objective:   Physical Exam Constitutional:      General: He is not in acute distress.    Appearance: He is well-developed.  HENT:     Head: Normocephalic and atraumatic.     Right Ear: External ear normal.     Left Ear: External ear normal.  Eyes:     Conjunctiva/sclera: Conjunctivae normal.     Pupils: Pupils are equal, round, and reactive to light.  Neck:     Musculoskeletal: Normal range of motion and neck supple.     Thyroid: No thyromegaly.  Cardiovascular:     Rate and Rhythm: Normal rate and regular rhythm.     Heart sounds: Normal heart sounds. No murmur.  Pulmonary:     Effort: No respiratory distress.     Breath sounds: No wheezing or rales.  Abdominal:     General: Bowel sounds are normal. There is no distension.     Palpations: Abdomen is soft. There is no  mass.     Tenderness: There is no abdominal tenderness. There is no guarding or rebound.  Musculoskeletal:     Comments: Previously noted edema right hand has resolved.  No evidence for any joint inflammation  Lymphadenopathy:     Cervical: No cervical adenopathy.  Skin:    Findings: No rash.     Comments: Feet reveal no skin lesions. Good distal foot pulses. Good capillary refill. No calluses. Normal sensation with monofilament testing    Neurological:     Mental Status: He is alert and oriented to person, place, and time.     Cranial Nerves: No cranial nerve deficit.     Deep Tendon Reflexes: Reflexes normal.        Assessment:     Physical exam.  Patient has had some recent polyarthralgias with positive rheumatoid factor and elevated sed rate of uncertain significance.  Rheumatology follow-up is pending.    Plan:     -Obtain screening labs.  We elected not to get A1c since he has been on prednisone 3 separate courses recently -Continue with yearly eye exam and yearly flu vaccine -Other vaccines up-to-date. -He will schedule follow-up here in a couple months after his rheumatology appointment and recheck A1c then.  Kristian Covey MD [facility] at [facility]

## 2018-04-18 NOTE — Patient Instructions (Signed)
Set up follow up here after you see the rheumatologist.

## 2018-04-21 ENCOUNTER — Telehealth: Payer: Self-pay | Admitting: Family Medicine

## 2018-04-21 NOTE — Telephone Encounter (Signed)
Pt requesting paper prescriptions of 5 medications and would like a copy of his lab results. Pt stated he will come by office to pick them up. Medications: Atorvastatin Flonase Metformin Quinapril-HCTZ Triamcinolone cream

## 2018-04-22 ENCOUNTER — Other Ambulatory Visit: Payer: Self-pay

## 2018-04-22 MED ORDER — TRIAMCINOLONE ACETONIDE 0.1 % EX CREA: 1.0000 "application " | TOPICAL_CREAM | Freq: Two times a day (BID) | CUTANEOUS | 1 refills | Status: DC

## 2018-04-22 MED ORDER — ATORVASTATIN CALCIUM 40 MG PO TABS: 40.0000 mg | ORAL_TABLET | Freq: Every day | ORAL | 2 refills | Status: DC

## 2018-04-22 MED ORDER — QUINAPRIL-HYDROCHLOROTHIAZIDE 20-12.5 MG PO TABS: 1.0000 | ORAL_TABLET | Freq: Two times a day (BID) | ORAL | 2 refills | Status: DC

## 2018-04-22 MED ORDER — FLUTICASONE PROPIONATE 50 MCG/ACT NA SUSP: NASAL | 3 refills | Status: DC

## 2018-04-22 MED ORDER — METFORMIN HCL 850 MG PO TABS: ORAL_TABLET | ORAL | 2 refills | Status: DC

## 2018-04-22 NOTE — Telephone Encounter (Signed)
Called patient and spoke to his spouse and let her know that the prescriptions and the lab results are waiting up front for pick up. Patients wife verified an understanding.

## 2018-05-09 ENCOUNTER — Telehealth: Payer: Self-pay | Admitting: *Deleted

## 2018-05-09 ENCOUNTER — Ambulatory Visit: Payer: Self-pay

## 2018-05-09 DIAGNOSIS — E119 Type 2 diabetes mellitus without complications: Secondary | ICD-10-CM

## 2018-05-09 NOTE — Telephone Encounter (Signed)
Copied from CRM [id number]. Topic: General - Inquiry >> May 09, 2018  8:26 AM Wyonia Hough E wrote: Reason for CRM: Pt went to see rheumatologist that he received a referral for on 3.10.20. The provider there put the Pt on additional meds (Gabapentin 300mg  3x a day, Prednisone 5mg  4x a day and folic acid 1mg  1 x day, Methotrexate 2.5mg  6x day 1x a week) and after doing so the Pt blood sugar jumped up to 180 and Pt wants to speak with Megan or Dr. [name] to see if something needs to be looked at with the new meds or an adjustment on his metFORMIN (GLUCOPHAGE) 850 MG tablet

## 2018-05-09 NOTE — Telephone Encounter (Signed)
Noted that pt's. message is being handled directly by nurse in office, so triage call was not returned to the pt.   Message from Areatha Keas sent at 05/09/2018 8:38 AM EDT   Summary: advise   Pt went to see rheumatologist that he received a referral for on 3.10.20. The provider there put the Pt on additional meds (Gabapentin 300mg  3x a day, Prednisone 5mg  4x a day and folic acid 1mg  1 x day, Methotrexate 2.5mg  6x day 1x a week) and after doing so the Pt blood sugar jumped up to 180 (it is normally around 140) and Pt wants to speak with [facility] or Dr. [name] to see if something needs to be looked at with the new meds or an adjustment on his metFORMIN (GLUCOPHAGE) 850 MG tablet/ please advise

## 2018-05-10 NOTE — Telephone Encounter (Signed)
Don't know how long he will be on prednisone.  Would not increase prednisone.  Add low dose Glimepiride 2 mg po AM with breakfast.  #30 with 2 refills.  Make sure he has follow up within 3 months.

## 2018-05-12 MED ORDER — GLIMEPIRIDE 2 MG PO TABS: 2.0000 mg | ORAL_TABLET | Freq: Every day | ORAL | 2 refills | Status: DC

## 2018-05-12 NOTE — Telephone Encounter (Signed)
Author phoned pt. To relay medication addition. Glimeperide sent to preferred pharmacy as ordered and 3 month follow up appointment made.

## 2018-06-01 ENCOUNTER — Other Ambulatory Visit: Payer: Self-pay

## 2018-06-01 ENCOUNTER — Telehealth: Payer: Self-pay | Admitting: Family Medicine

## 2018-06-01 MED ORDER — GLUCOSE BLOOD VI STRP: ORAL_STRIP | 3 refills | Status: DC

## 2018-06-01 NOTE — Telephone Encounter (Signed)
I have sent over a refill for the OneTouch Verio test strips to the pharmacy provided. I do not see a refill request for the requested test strips from 05/30/18 unfortunately.

## 2018-06-01 NOTE — Telephone Encounter (Signed)
Copied from CRM [id number]. Topic: Quick Communication - Rx Refill/Question >> Jun 01, 2018  8:15 AM Kevin Wise wrote: Medication: OneTouch Verio   Blood Glucose Test Strips   Patient is all out request refill on 05/30/2018 blood sugars running from 56 to 220.   Has the patient contacted their pharmacy? Yes.   (Agent: If no, request that the patient contact the pharmacy for the refill.) (Agent: If yes, when and what did the pharmacy advise?)  Preferred Pharmacy (with phone number or street name): [id number] [place], [place] - [address] AT [address]CROSS FROM [address] [phone] (Phone) [phone] (Fax)    Agent: Please be advised that RX refills may take up to 3 business days. We ask that you follow-up with your pharmacy.

## 2018-08-04 ENCOUNTER — Other Ambulatory Visit: Payer: Self-pay | Admitting: Family Medicine

## 2018-08-04 DIAGNOSIS — E119 Type 2 diabetes mellitus without complications: Secondary | ICD-10-CM

## 2018-08-08 ENCOUNTER — Ambulatory Visit: Payer: Medicare Other | Admitting: Family Medicine

## 2018-08-12 ENCOUNTER — Other Ambulatory Visit: Payer: Self-pay

## 2018-08-12 ENCOUNTER — Ambulatory Visit (INDEPENDENT_AMBULATORY_CARE_PROVIDER_SITE_OTHER): Payer: Medicare Other | Admitting: Family Medicine

## 2018-08-12 DIAGNOSIS — M069 Rheumatoid arthritis, unspecified: Secondary | ICD-10-CM | POA: Insufficient documentation

## 2018-08-12 DIAGNOSIS — M059 Rheumatoid arthritis with rheumatoid factor, unspecified: Secondary | ICD-10-CM | POA: Diagnosis not present

## 2018-08-12 DIAGNOSIS — E119 Type 2 diabetes mellitus without complications: Secondary | ICD-10-CM | POA: Diagnosis not present

## 2018-08-12 MED ORDER — HYDROXYZINE HCL 25 MG PO TABS: 25.0000 mg | ORAL_TABLET | Freq: Three times a day (TID) | ORAL | 0 refills | Status: DC | PRN

## 2018-08-12 NOTE — Progress Notes (Signed)
Patient ID: Kevin Wise, male   DOB: Oct 09, 1935, 83 y.o.   MRN: [id number]  This visit type was conducted due to national recommendations for restrictions regarding the COVID-19 pandemic in an effort to limit this patient's exposure and mitigate transmission in our community.   Virtual Visit via Telephone Note  I connected with Kevin Wise on 08/12/18 at 11:15 AM EDT by telephone and verified that I am speaking with the correct person using two identifiers.   I discussed the limitations, risks, security and privacy concerns of performing an evaluation and management service by telephone and the availability of in person appointments. I also discussed with the patient that there may be a patient responsible charge related to this service. The patient expressed understanding and agreed to proceed.  Location patient: home Location provider: work or home office Participants present for the call: patient, provider Patient did not have a visit in the prior 7 days to address this/these issue(s).   History of Present Illness:  Patient has type 2 diabetes.  History of excellent control.  Last A1c 6.4%.  However, he presented with acute arthralgias and ended up having elevated rheumatoid factor of 23 with negative CCP antibodies.  We referred to rheumatology and he has been treated with some prednisone and is currently getting methotrexate injections.  He tried oral methotrexate but not have much relief.  He did have a bump in blood sugars when he was taking some prednisone when back in March we started low-dose Amaryl 2 mg once daily.  Blood sugars improved since then.  No polyuria or polydipsia.   He also has history of hypertension and hyperlipidemia.  Compliant with all medications.    Observations/Objective: Patient sounds cheerful and well on the phone. I do not appreciate any SOB. Speech and thought processing are grossly intact. Patient reported vitals:  Assessment and Plan:  #1 type  2 diabetes.  Last A1c 6.4%.  Did have some recent prednisone use which exacerbated his blood sugars short-term  -Patient will come Monday for A1c  #2 rheumatoid arthritis currently on methotrexate pain gradually improving  Follow Up Instructions:  -As above for labs on Monday.  Set up 30-month follow-up   99441 5-10 99442 11-20 99443 21-30 I did not refer this patient for an OV in the next 24 hours for this/these issue(s).  I discussed the assessment and treatment plan with the patient. The patient was provided an opportunity to ask questions and all were answered. The patient agreed with the plan and demonstrated an understanding of the instructions.   The patient was advised to call back or seek an in-person evaluation if the symptoms worsen or if the condition fails to improve as anticipated.  I provided 17 minutes of non-face-to-face time during this encounter.   Carolann Littler, MD

## 2018-08-15 ENCOUNTER — Other Ambulatory Visit: Payer: Self-pay | Admitting: Family Medicine

## 2018-08-15 ENCOUNTER — Other Ambulatory Visit (INDEPENDENT_AMBULATORY_CARE_PROVIDER_SITE_OTHER): Payer: Medicare Other

## 2018-08-15 ENCOUNTER — Other Ambulatory Visit: Payer: Self-pay

## 2018-08-15 DIAGNOSIS — E119 Type 2 diabetes mellitus without complications: Secondary | ICD-10-CM | POA: Diagnosis not present

## 2018-08-15 LAB — HEMOGLOBIN A1C: Hgb A1c MFr Bld: 7.1 % — ABNORMAL HIGH (ref 4.6–6.5)

## 2018-08-15 NOTE — Telephone Encounter (Signed)
Just filled on the 19th of June.   Shouldn't be out yet.  He may have one refill on current prescription.

## 2018-08-15 NOTE — Telephone Encounter (Signed)
OK for 90 day with 1 refill?  Last filled on 08/12/18 for 30 days no refill.

## 2018-08-16 NOTE — Telephone Encounter (Signed)
I do not recommend regular use of this medication.  #90 should be sufficient.

## 2018-08-16 NOTE — Telephone Encounter (Signed)
Patient wants a 90 day supply and one refill. OK to prescribe for 90 days?

## 2018-11-14 ENCOUNTER — Other Ambulatory Visit: Payer: Self-pay

## 2018-11-14 ENCOUNTER — Encounter: Payer: Self-pay | Admitting: Family Medicine

## 2018-11-14 ENCOUNTER — Ambulatory Visit (INDEPENDENT_AMBULATORY_CARE_PROVIDER_SITE_OTHER): Payer: Medicare Other | Admitting: Family Medicine

## 2018-11-14 VITALS — BP 108/64 | HR 58 | Temp 97.4°F | Ht 66.5 in | Wt 166.5 lb

## 2018-11-14 DIAGNOSIS — E119 Type 2 diabetes mellitus without complications: Secondary | ICD-10-CM | POA: Diagnosis not present

## 2018-11-14 DIAGNOSIS — I10 Essential (primary) hypertension: Secondary | ICD-10-CM

## 2018-11-14 DIAGNOSIS — E785 Hyperlipidemia, unspecified: Secondary | ICD-10-CM

## 2018-11-14 LAB — POCT GLYCOSYLATED HEMOGLOBIN (HGB A1C): Hemoglobin A1C: 5.9 % — AB (ref 4.0–5.6)

## 2018-11-14 NOTE — Progress Notes (Signed)
  Subjective:     Patient ID: Kevin Wise, male   DOB: 18-May-1935, 83 y.o.   MRN: [id number]  HPI Patient is here for medical follow-up.  Type 2 diabetes.  Last A1c 7.1%.  He had some confusion regarding glimepiride and apparently only taking this once per week.  Not monitoring blood sugars regularly.  He has had no recent hypoglycemic symptoms.  Remains on metformin regularly.  Diagnosed with rheumatoid arthritis in the past year.  Is on methotrexate per rheumatology.  He has hypertension treated with several drugs.  His blood pressures been stable.  No recent dizziness.  He takes Lipitor for hyperlipidemia.  No myalgias.  He has had some bilateral anterior hip pain and had x-rays per rheumatology and no evidence for significant osteoarthritis.  He has achy pain in the mornings when he first gets up and this tends to get better as he moves around.  No claudication symptoms  Past Medical History:  Diagnosis Date  . ANEMIA 01/10/2008  . ARTHRITIS 01/06/2007  . CAROTID ARTERY STENOSIS, RIGHT 01/10/2009  . Diabetes mellitus without complication ([place])   . ERECTILE DYSFUNCTION 01/06/2007  . HYPERGLYCEMIA, BORDERLINE 12/02/2009  . HYPERLIPIDEMIA 01/06/2007  . HYPERTENSION, BENIGN ESSENTIAL 01/06/2007  . HYPOTHYROIDISM 01/10/2008  . LIVER FUNCTION TESTS, ABNORMAL, HX OF 12/02/2009  . Other symptoms involving cardiovascular system 01/10/2009  . Rosacea 01/06/2007  . UNS ADVRS EFF OTH RX MEDICINAL&BIOLOGICAL SBSTNC 01/10/2008  . VITAMIN D DEFICIENCY 01/10/2008   Past Surgical History:  Procedure Laterality Date  . HERNIA REPAIR     bilateral inguinal   . TONSILLECTOMY      reports that he has never smoked. He has never used smokeless tobacco. He reports that he does not use drugs. No history on file for alcohol. family history includes Diabetes in his mother; Hyperlipidemia in his mother. No Known Allergies'  Review of Systems     Objective:   Physical Exam Vitals signs reviewed.   Constitutional:      Appearance: Normal appearance.  Cardiovascular:     Rate and Rhythm: Normal rate and regular rhythm.     Pulses: Normal pulses.     Comments: Feet are warm to touch with good distal pulses Pulmonary:     Effort: Pulmonary effort is normal.     Breath sounds: Normal breath sounds.  Musculoskeletal:     Right lower leg: No edema.     Left lower leg: No edema.     Comments: No significant inguinal adenopathy  Neurological:     General: No focal deficit present.     Mental Status: He is alert and oriented to person, place, and time.        Assessment:     #1 type 2 diabetes well controlled with A1c 5.9%  #2 hypertension stable and at goal  #3 hyperlipidemia which is treated with Lipitor  #4 rheumatoid arthritis-followed by rheumatology and now on methotrexate    Plan:     -Would recommend he drop glimepiride which he apparently has been taking inconsistently as A1c today was 5.9% -Flu vaccine already given -75-month follow-up  Eulas Post MD [facility] at [facility]

## 2018-11-14 NOTE — Patient Instructions (Signed)
Confirm if taking the Glimepiride once weekly-  If so, discontinue that medication.    Your A1C was excellent at 5.9!

## 2018-11-22 ENCOUNTER — Other Ambulatory Visit: Payer: Self-pay | Admitting: Family Medicine

## 2018-12-05 ENCOUNTER — Other Ambulatory Visit: Payer: Self-pay | Admitting: Family Medicine

## 2019-01-26 ENCOUNTER — Other Ambulatory Visit: Payer: Self-pay | Admitting: Family Medicine

## 2019-01-27 NOTE — Telephone Encounter (Signed)
OK to fill? Last note on Rx from June 2020 said avoid regular use.

## 2019-01-29 NOTE — Telephone Encounter (Signed)
Refill OK

## 2019-02-13 ENCOUNTER — Other Ambulatory Visit: Payer: Self-pay | Admitting: Family Medicine

## 2019-02-14 ENCOUNTER — Encounter: Payer: Self-pay | Admitting: Family Medicine

## 2019-02-14 ENCOUNTER — Ambulatory Visit (INDEPENDENT_AMBULATORY_CARE_PROVIDER_SITE_OTHER): Payer: Medicare Other | Admitting: Family Medicine

## 2019-02-14 ENCOUNTER — Other Ambulatory Visit: Payer: Self-pay

## 2019-02-14 ENCOUNTER — Ambulatory Visit: Payer: Medicare Other | Admitting: Family Medicine

## 2019-02-14 VITALS — BP 120/78 | HR 76 | Temp 97.9°F | Ht 66.5 in | Wt 164.0 lb

## 2019-02-14 DIAGNOSIS — I10 Essential (primary) hypertension: Secondary | ICD-10-CM

## 2019-02-14 DIAGNOSIS — L299 Pruritus, unspecified: Secondary | ICD-10-CM

## 2019-02-14 DIAGNOSIS — E119 Type 2 diabetes mellitus without complications: Secondary | ICD-10-CM | POA: Diagnosis not present

## 2019-02-14 LAB — POCT GLYCOSYLATED HEMOGLOBIN (HGB A1C): Hemoglobin A1C: 5.9 % — AB (ref 4.0–5.6)

## 2019-02-14 NOTE — Patient Instructions (Signed)
Consider Aveeno soap and moisturizer  Consider trial of Xyzal (anti-histamine) over the counter once daily.

## 2019-02-14 NOTE — Progress Notes (Signed)
Subjective:     Patient ID: Kevin Wise, male   DOB: 1935-05-09, 83 y.o.   MRN: [id number]  HPI   Mr. Scalici is here for medical follow-up and also with continued diffuse intermittent itching.  He has had some pruritus now for several months.  He is tried multiple things topically and systemically and is also seen dermatologist.  We had prescribed triamcinolone which she is using about 50-50 with moisturizer such as Eucerin.  He also switched to [employer].  He is tried [employer].  No hives.  His itching is mostly trunk with some extremities as well.  He has limited bathing time and avoiding hot water.  Does have some dry skin.  Type 2 diabetes.  Last A1c was 5.9%.  Not monitoring blood sugars daily.  Medications reviewed and compliant with all.  He is taking methotrexate for rheumatoid arthritis and that is his only newer medication.  He is followed by rheumatology for that.  His arthritis is fairly well controlled.  He has hypertension which is controlled with quinapril HCTZ.  He has been on this for several years.  No recent dizziness.   Past Medical History:  Diagnosis Date  . ANEMIA 01/10/2008  . ARTHRITIS 01/06/2007  . CAROTID ARTERY STENOSIS, RIGHT 01/10/2009  . Diabetes mellitus without complication ([place])   . ERECTILE DYSFUNCTION 01/06/2007  . HYPERGLYCEMIA, BORDERLINE 12/02/2009  . HYPERLIPIDEMIA 01/06/2007  . HYPERTENSION, BENIGN ESSENTIAL 01/06/2007  . HYPOTHYROIDISM 01/10/2008  . LIVER FUNCTION TESTS, ABNORMAL, HX OF 12/02/2009  . Other symptoms involving cardiovascular system 01/10/2009  . Rosacea 01/06/2007  . UNS ADVRS EFF OTH RX MEDICINAL&BIOLOGICAL SBSTNC 01/10/2008  . VITAMIN D DEFICIENCY 01/10/2008   Past Surgical History:  Procedure Laterality Date  . HERNIA REPAIR     bilateral inguinal   . TONSILLECTOMY      reports that he has never smoked. He has never used smokeless tobacco. He reports that he does not use drugs. No history on file for  alcohol. family history includes Diabetes in his mother; Hyperlipidemia in his mother. No Known Allergies   Review of Systems  Constitutional: Negative for fatigue.  Eyes: Negative for visual disturbance.  Respiratory: Negative for cough, chest tightness and shortness of breath.   Cardiovascular: Negative for chest pain, palpitations and leg swelling.  Gastrointestinal: Negative for abdominal pain, nausea and vomiting.  Endocrine: Negative for polydipsia and polyuria.  Skin: Negative for rash.  Neurological: Negative for dizziness, syncope, weakness, light-headedness and headaches.       Objective:   Physical Exam Vitals reviewed.  Constitutional:      Appearance: Normal appearance.  Cardiovascular:     Rate and Rhythm: Normal rate and regular rhythm.  Pulmonary:     Effort: Pulmonary effort is normal.     Breath sounds: Normal breath sounds.  Musculoskeletal:     Right lower leg: No edema.     Left lower leg: No edema.  Skin:    Comments: Skin reveals a few mild excoriations.  No hives.  No pustules.  No vesicles.  Mild dryness diffusely  Neurological:     Mental Status: He is alert.        Assessment:     #1 type 2 diabetes well controlled with A1c 5.9%  #2 diffuse pruritus.  Suspect dry skin related.  Doubt related to cholestasis or liver issue.  #3 hypertension well controlled    Plan:     -Continue current regular medications -Consider trial of Xyzal  5 mg once daily -Consider Aveeno soap and Aveeno moisturizing lotion -Avoid systemic steroids with his diabetes -Continue topical triamcinolone -If pruritus not resolving or improving over the next few weeks consider further blood evaluation including hepatic panel  Kristian Covey MD [facility] at [facility]

## 2019-03-29 ENCOUNTER — Other Ambulatory Visit: Payer: Self-pay

## 2019-03-29 ENCOUNTER — Telehealth: Payer: Self-pay | Admitting: Family Medicine

## 2019-03-29 MED ORDER — ONETOUCH DELICA LANCETS 33G MISC: 3 refills | Status: DC

## 2019-03-29 MED ORDER — ONETOUCH VERIO VI STRP: ORAL_STRIP | 3 refills | Status: DC

## 2019-03-29 NOTE — Telephone Encounter (Signed)
I have sent in the refill for the requested supplies to the pharmacy requested.

## 2019-03-29 NOTE — Telephone Encounter (Signed)
Medication Refill: Glucose Blood test strips  [facility] [address] Valinda Hoar: [phone]

## 2019-03-31 ENCOUNTER — Other Ambulatory Visit: Payer: Self-pay | Admitting: Family Medicine

## 2019-03-31 NOTE — Telephone Encounter (Signed)
Refill once 

## 2019-04-21 ENCOUNTER — Ambulatory Visit (INDEPENDENT_AMBULATORY_CARE_PROVIDER_SITE_OTHER): Payer: Medicare Other | Admitting: Family Medicine

## 2019-04-21 ENCOUNTER — Other Ambulatory Visit: Payer: Self-pay

## 2019-04-21 ENCOUNTER — Encounter: Payer: Self-pay | Admitting: Family Medicine

## 2019-04-21 VITALS — BP 98/66 | HR 60 | Temp 97.6°F | Ht 67.0 in | Wt 162.8 lb

## 2019-04-21 DIAGNOSIS — Z0000 Encounter for general adult medical examination without abnormal findings: Secondary | ICD-10-CM

## 2019-04-21 LAB — VITAMIN D 25 HYDROXY (VIT D DEFICIENCY, FRACTURES): VITD: 16.77 ng/mL — ABNORMAL LOW (ref 30.00–100.00)

## 2019-04-21 LAB — LIPID PANEL
Cholesterol: 121 mg/dL (ref 0–200)
HDL: 33.7 mg/dL — ABNORMAL LOW (ref >39.00)
LDL Cholesterol: 63 mg/dL (ref 0–99)
NonHDL: 86.88
Total CHOL/HDL Ratio: 4
Triglycerides: 120 mg/dL (ref 0.0–149.0)
VLDL: 24 mg/dL (ref 0.0–40.0)

## 2019-04-21 LAB — HEPATIC FUNCTION PANEL
ALT: 21 U/L (ref 0–53)
AST: 20 U/L (ref 0–37)
Albumin: 4.3 g/dL (ref 3.5–5.2)
Alkaline Phosphatase: 66 U/L (ref 39–117)
Bilirubin, Direct: 0.2 mg/dL (ref 0.0–0.3)
Total Bilirubin: 0.7 mg/dL (ref 0.2–1.2)
Total Protein: 6.9 g/dL (ref 6.0–8.3)

## 2019-04-21 LAB — BASIC METABOLIC PANEL
BUN: 17 mg/dL (ref 6–23)
CO2: 29 mEq/L (ref 19–32)
Calcium: 9.6 mg/dL (ref 8.4–10.5)
Chloride: 101 mEq/L (ref 96–112)
Creatinine, Ser: 1.1 mg/dL (ref 0.40–1.50)
GFR: 77.68 mL/min (ref >60.00)
Glucose, Bld: 92 mg/dL (ref 70–99)
Potassium: 4.1 mEq/L (ref 3.5–5.1)
Sodium: 138 mEq/L (ref 135–145)

## 2019-04-21 LAB — CBC WITH DIFFERENTIAL/PLATELET
Basophils Absolute: 0 10*3/uL (ref 0.0–0.1)
Basophils Relative: 0.8 % (ref 0.0–3.0)
Eosinophils Absolute: 0.2 10*3/uL (ref 0.0–0.7)
Eosinophils Relative: 2.6 % (ref 0.0–5.0)
HCT: 35.7 % — ABNORMAL LOW (ref 39.0–52.0)
Hemoglobin: 11.9 g/dL — ABNORMAL LOW (ref 13.0–17.0)
Lymphocytes Relative: 32.1 % (ref 12.0–46.0)
Lymphs Abs: 1.9 10*3/uL (ref 0.7–4.0)
MCHC: 33.5 g/dL (ref 30.0–36.0)
MCV: 95.6 fl (ref 78.0–100.0)
Monocytes Absolute: 0.3 10*3/uL (ref 0.1–1.0)
Monocytes Relative: 5.8 % (ref 3.0–12.0)
Neutro Abs: 3.4 10*3/uL (ref 1.4–7.7)
Neutrophils Relative %: 58.7 % (ref 43.0–77.0)
Platelets: 316 10*3/uL (ref 150.0–400.0)
RBC: 3.73 Mil/uL — ABNORMAL LOW (ref 4.22–5.81)
RDW: 13.9 % (ref 11.5–15.5)
WBC: 5.9 10*3/uL (ref 4.0–10.5)

## 2019-04-21 LAB — HEMOGLOBIN A1C: Hgb A1c MFr Bld: 6.8 % — ABNORMAL HIGH (ref 4.6–6.5)

## 2019-04-21 LAB — TSH: TSH: 0.74 u[IU]/mL (ref 0.35–4.50)

## 2019-04-21 MED ORDER — ATORVASTATIN CALCIUM 40 MG PO TABS: 40.0000 mg | ORAL_TABLET | Freq: Every day | ORAL | 3 refills | Status: DC

## 2019-04-21 MED ORDER — HYDROXYZINE HCL 25 MG PO TABS: 25.0000 mg | ORAL_TABLET | Freq: Three times a day (TID) | ORAL | 3 refills | Status: DC | PRN

## 2019-04-21 MED ORDER — QUINAPRIL-HYDROCHLOROTHIAZIDE 20-12.5 MG PO TABS: 1.0000 | ORAL_TABLET | Freq: Two times a day (BID) | ORAL | 3 refills | Status: DC

## 2019-04-21 MED ORDER — TRIAMCINOLONE ACETONIDE 0.1 % EX CREA: TOPICAL_CREAM | CUTANEOUS | 1 refills | Status: DC

## 2019-04-21 MED ORDER — GABAPENTIN 300 MG PO CAPS: 300.0000 mg | ORAL_CAPSULE | Freq: Every day | ORAL | 3 refills | Status: DC

## 2019-04-21 MED ORDER — METFORMIN HCL 850 MG PO TABS: ORAL_TABLET | ORAL | 2 refills | Status: DC

## 2019-04-21 NOTE — Progress Notes (Signed)
Established Patient Office Visit  Subjective:  Patient ID: ALEXEY RHOADS, male    DOB: March 27, 1935  Age: 84 y.o. MRN: [id number]  CC:  Chief Complaint  Patient presents with  . Annual Exam    HPI ASANI MCBURNEY presents for physical exam. His chief concern is "itching" that has been persistent despite various treatments. States it is worse in the mornings when he wakes up and in the evenings. He does not associate it with the start of a medication or a self-care product. Denies improvement with OTC allergy medication or Aveeno soap and lotion. States gabapentin 300 mg taken at bedtime is most helpful and triamcinolone cream has helped "a little." He also reports that taking hydroxyzine at bedtime is helpful. Denies other concerns. Requests annual fasting lab work today and medication refills to be printed so that he can get the refills as needed.    Past Medical History:  Diagnosis Date  . ANEMIA 01/10/2008  . ARTHRITIS 01/06/2007  . CAROTID ARTERY STENOSIS, RIGHT 01/10/2009  . Diabetes mellitus without complication ([place])   . ERECTILE DYSFUNCTION 01/06/2007  . HYPERGLYCEMIA, BORDERLINE 12/02/2009  . HYPERLIPIDEMIA 01/06/2007  . HYPERTENSION, BENIGN ESSENTIAL 01/06/2007  . HYPOTHYROIDISM 01/10/2008  . LIVER FUNCTION TESTS, ABNORMAL, HX OF 12/02/2009  . Other symptoms involving cardiovascular system 01/10/2009  . Rosacea 01/06/2007  . UNS ADVRS EFF OTH RX MEDICINAL&BIOLOGICAL SBSTNC 01/10/2008  . VITAMIN D DEFICIENCY 01/10/2008    Past Surgical History:  Procedure Laterality Date  . HERNIA REPAIR     bilateral inguinal   . TONSILLECTOMY      Family History  Problem Relation Age of Onset  . Hyperlipidemia Mother   . Diabetes Mother     Social History   Socioeconomic History  . Marital status: Married    Spouse name: Not on file  . Number of children: Not on file  . Years of education: Not on file  . Highest education level: Not on file  Occupational History  . Not  on file  Tobacco Use  . Smoking status: Never Smoker  . Smokeless tobacco: Never Used  Substance and Sexual Activity  . Alcohol use: Not on file  . Drug use: No  . Sexual activity: Not on file  Other Topics Concern  . Not on file  Social History Narrative  . Not on file   Social Determinants of Health   Financial Resource Strain:   . Difficulty of Paying Living Expenses: Not on file  Food Insecurity:   . Worried About Charity fundraiser in the Last Year: Not on file  . Ran Out of Food in the Last Year: Not on file  Transportation Needs:   . Lack of Transportation (Medical): Not on file  . Lack of Transportation (Non-Medical): Not on file  Physical Activity:   . Days of Exercise per Week: Not on file  . Minutes of Exercise per Session: Not on file  Stress:   . Feeling of Stress : Not on file  Social Connections:   . Frequency of Communication with Friends and Family: Not on file  . Frequency of Social Gatherings with Friends and Family: Not on file  . Attends Religious Services: Not on file  . Active Member of Clubs or Organizations: Not on file  . Attends Archivist Meetings: Not on file  . Marital Status: Not on file  Intimate Partner Violence:   . Fear of Current or Ex-Partner: Not on file  . Emotionally  Abused: Not on file  . Physically Abused: Not on file  . Sexually Abused: Not on file    Outpatient Medications Prior to Visit  Medication Sig Dispense Refill  . aspirin 81 MG tablet Take 81 mg by mouth daily.      . B-D TB SYRINGE 1CC/27GX1/2" 27G X 1/2" 1 ML MISC USE AS DIRECTED WITH METHOTREXATE    . fluticasone (FLONASE) 50 MCG/ACT nasal spray INSERT 2 SPRAYS IN EACH NOSTRIL EVERY DAY 48 g 3  . folic acid (FOLVITE) 1 MG tablet Take 1 mg by mouth daily.    Marland Kitchen glucose blood (ONETOUCH VERIO) test strip Use as instructed to check blood sugars 2 times daily. Dx Code E11.9 100 each 3  . latanoprost (XALATAN) 0.005 % ophthalmic solution PLACE 1 DROP IN BOTH  EYES AT BEDTIME  3  . lidocaine-hydrocortisone (ANAMANTEL HC) 3-0.5 % CREA Place 1 Applicatorful rectally 2 (two) times daily. 7 g 2  . methotrexate 50 MG/2ML injection INJECT 0.8ML WEEKLY    . Methotrexate Sodium (METHOTREXATE, PF,) 50 MG/2ML injection 0.6ML ONCE A WEEK INJECTION 90 DAYS    . OneTouch Delica Lancets 33G MISC Use as instructed to check blood sugars 2 times daily. Dx Code E11.9 200 each 3  . potassium gluconate 595 (99 K) MG TABS tablet Take by mouth.    . timolol (TIMOPTIC) 0.5 % ophthalmic solution INSTILL 1 DROP INTO BOTH EYES TWICE A DAY    . atorvastatin (LIPITOR) 40 MG tablet TAKE 1 TABLET BY MOUTH EVERY DAY 90 tablet 2  . gabapentin (NEURONTIN) 100 MG capsule Take 1 capsule (100 mg total) by mouth at bedtime. 30 capsule 3  . hydrOXYzine (ATARAX/VISTARIL) 25 MG tablet TAKE 1 TABLET (25 MG TOTAL) BY MOUTH EVERY 8 (EIGHT) HOURS AS NEEDED. 270 tablet 0  . metFORMIN (GLUCOPHAGE) 850 MG tablet TAKE 1 TABLET BY MOUTH TWICE A DAY WITH MEAL 180 tablet 1  . quinapril-hydrochlorothiazide (ACCURETIC) 20-12.5 MG tablet Take 1 tablet by mouth 2 (two) times daily. 180 tablet 2  . triamcinolone cream (KENALOG) 0.1 % APPLY 1 APPLICATION 2 TIMES DAILY 454 g 1  . traMADol (ULTRAM) 50 MG tablet Take one to two tablets every 6 hours prn neck pain 90 tablet 0   No facility-administered medications prior to visit.    No Known Allergies  ROS Review of Systems  Constitutional: Negative for chills, fever and unexpected weight change.  Respiratory: Negative for cough and shortness of breath.   Cardiovascular: Negative for chest pain.  Gastrointestinal: Negative for abdominal pain.  Genitourinary: Negative for dysuria.  Hematological: Negative for adenopathy.  Psychiatric/Behavioral: Negative for dysphoric mood.        Objective:    Physical Exam  Constitutional: He is oriented to person, place, and time. He appears well-developed and well-nourished. No distress.  HENT:  Head:  Normocephalic and atraumatic.  Right Ear: External ear normal.  Left Ear: External ear normal.  Mouth/Throat: Oropharynx is clear and moist. No oropharyngeal exudate.  Eyes: Pupils are equal, round, and reactive to light. Conjunctivae and EOM are normal.  Neck: No JVD present. No tracheal deviation present.  Cardiovascular: Normal rate, regular rhythm, normal heart sounds and intact distal pulses. Exam reveals no gallop and no friction rub.  No murmur heard. Pulmonary/Chest: Effort normal and breath sounds normal. No stridor. No respiratory distress. He has no wheezes. He has no rales. He exhibits no tenderness.  Abdominal: Soft. He exhibits no distension. There is no abdominal tenderness.  Musculoskeletal:  General: No deformity or edema. Normal range of motion.     Cervical back: Normal range of motion and neck supple.  Neurological: He is alert and oriented to person, place, and time.  Skin: Skin is warm and dry. He is not diaphoretic.  Psychiatric: He has a normal mood and affect. His behavior is normal. Judgment and thought content normal.    BP 98/66 (BP Location: Left Arm, Patient Position: Sitting, Cuff Size: Large)   Pulse 60   Temp 97.6 F (36.4 C) (Temporal)   Ht 5\' 7"  (1.702 m)   Wt 162 lb 12.8 oz (73.8 kg)   SpO2 92%   BMI 25.50 kg/m  Wt Readings from Last 3 Encounters:  04/21/19 162 lb 12.8 oz (73.8 kg)  02/14/19 164 lb (74.4 kg)  11/14/18 166 lb 8 oz (75.5 kg)     Health Maintenance Due  Topic Date Due  . FOOT EXAM  04/12/2018  . OPHTHALMOLOGY EXAM  03/29/2019    There are no preventive care reminders to display for this patient.  Lab Results  Component Value Date   TSH 0.61 04/18/2018   Lab Results  Component Value Date   WBC 15.5 (H) 04/18/2018   HGB 11.7 (L) 04/18/2018   HCT 35.8 (L) 04/18/2018   MCV 91.8 04/18/2018   PLT 462.0 (H) 04/18/2018   Lab Results  Component Value Date   NA 140 04/18/2018   K 4.3 04/18/2018   CO2 31 04/18/2018    GLUCOSE 113 (H) 04/18/2018   BUN 22 04/18/2018   CREATININE 1.13 04/18/2018   BILITOT 0.4 04/18/2018   ALKPHOS 55 04/18/2018   AST 16 04/18/2018   ALT 21 04/18/2018   PROT 6.5 04/18/2018   ALBUMIN 3.9 04/18/2018   CALCIUM 9.6 04/18/2018   GFR 75.50 04/18/2018   Lab Results  Component Value Date   CHOL 120 04/18/2018   Lab Results  Component Value Date   HDL 46.30 04/18/2018   Lab Results  Component Value Date   LDLCALC 52 04/18/2018   Lab Results  Component Value Date   TRIG 108.0 04/18/2018   Lab Results  Component Value Date   CHOLHDL 3 04/18/2018   Lab Results  Component Value Date   HGBA1C 5.9 (A) 02/14/2019      Assessment & Plan:   Problem List Items Addressed This Visit    None    Visit Diagnoses    Physical exam    -  Primary   Relevant Orders   Basic metabolic panel   Lipid panel   CBC with Differential/Platelet   TSH   Hepatic function panel   Hemoglobin A1c   VITAMIN D 25 Hydroxy (Vit-D Deficiency, Fractures)      Meds ordered this encounter  Medications  . metFORMIN (GLUCOPHAGE) 850 MG tablet    Sig: TAKE 1 TABLET BY MOUTH TWICE A DAY WITH MEAL    Dispense:  180 tablet    Refill:  2  . quinapril-hydrochlorothiazide (ACCURETIC) 20-12.5 MG tablet    Sig: Take 1 tablet by mouth 2 (two) times daily.    Dispense:  180 tablet    Refill:  3  . triamcinolone cream (KENALOG) 0.1 %    Sig: APPLY 1 APPLICATION 2 TIMES DAILY    Dispense:  454 g    Refill:  1  . gabapentin (NEURONTIN) 300 MG capsule    Sig: Take 1 capsule (300 mg total) by mouth at bedtime.    Dispense:  90 capsule  Refill:  3  . hydrOXYzine (ATARAX/VISTARIL) 25 MG tablet    Sig: Take 1 tablet (25 mg total) by mouth every 8 (eight) hours as needed.    Dispense:  90 tablet    Refill:  3  . atorvastatin (LIPITOR) 40 MG tablet    Sig: Take 1 tablet (40 mg total) by mouth daily.    Dispense:  90 tablet    Refill:  3   ASSESSMENT: 1. Pruritis - hyperpigmented linear  markings seen on patient's bilateral arms from itching. No erythema or drainage noted.  2. HTN - BP is low normal today. Patient denies pre-syncope, lightheadedness, dizziness, or unsteady gait.  3. Type 2 DM - Glucose levels have been stable. Foot exam is normal. Recent annual eye exam.   PLAN: 1. Pruritis - patient requests Rx for increased dose of gabapentin to 300 mg which he has tried on occasion and feels like it helps. He will continue hydroxyzine 25 mg at bedtime. Advised continued use of moisturizers and try to avoid scratching.   2. Essential hypertension - continue to monitor and call to report symptoms of orthostasis. Advised medication adjustment may be needed if BP remains low.  3. Annual fasting labs - lipid profile, BMET, CBC, TSH, hepatic function, Vit D. Will advise accordingly once results are back.  4. Schedule 3 month follow-up.   Follow-up: No follow-ups on file.    Levi Aland, RN  AGPCNP Student, [employer]  Above notes reviewed and agree. Needs repeat eye exam, otherwise health maintenance  Up to date  Kristian Covey MD [facility]

## 2019-04-21 NOTE — Patient Instructions (Addendum)
Follow up in 3 months We will call you with your lab results  Pruritus Pruritus is an itchy feeling on the skin. One of the most common causes is dry skin, but many different things can cause itching. Most cases of itching do not require medical attention. Sometimes itchy skin can turn into a rash. Follow these instructions at home: Skin care   Apply moisturizing lotion to your skin as needed. Lotion that contains petroleum jelly is best.  Take medicines or apply medicated creams only as told by your health care provider. This may include: ? Corticosteroid cream. ? Anti-itch lotions. ? Oral antihistamines.  Apply a cool, wet cloth (cool compress) to the affected areas.  Take baths with one of the following: ? Epsom salts. You can get these at your local pharmacy or grocery store. Follow the instructions on the packaging. ? Baking soda. Pour a small amount into the bath as told by your health care provider. ? Colloidal oatmeal. You can get this at your local pharmacy or grocery store. Follow the instructions on the packaging.  Apply baking soda paste to your skin. To make the paste, stir water into a small amount of baking soda until it reaches a paste-like consistency.  Do not scratch your skin.  Do not take hot showers or baths, which can make itching worse. A cool shower may help with itching as long as you apply moisturizing lotion after the shower.  Do not use scented soaps, detergents, perfumes, and cosmetic products. Instead, use gentle, unscented versions of these items. General instructions  Avoid wearing tight clothes.  Keep a journal to help find out what is causing your itching. Write down: ? What you eat and drink. ? What cosmetic products you use. ? What soaps or detergents you use. ? What you wear, including jewelry.  Use a humidifier. This keeps the air moist, which helps to prevent dry skin.  Be aware of any changes in your itchiness. Contact a health care  provider if:  The itching does not go away after several days.  You are unusually thirsty or urinating more than normal.  Your skin tingles or feels numb.  Your skin or the white parts of your eyes turn yellow (jaundice).  You feel weak.  You have any of the following: ? Night sweats. ? Tiredness (fatigue). ? Weight loss. ? Abdominal pain. Summary  Pruritus is an itchy feeling on the skin. One of the most common causes is dry skin, but many different conditions and factors can cause itching.  Apply moisturizing lotion to your skin as needed. Lotion that contains petroleum jelly is best.  Take medicines or apply medicated creams only as told by your health care provider.  Do not take hot showers or baths. Do not use scented soaps, detergents, perfumes, or cosmetic products. This information is not intended to replace advice given to you by your health care provider. Make sure you discuss any questions you have with your health care provider. Document Revised: 02/23/2017 Document Reviewed: 02/23/2017 Elsevier Patient Education  [place]. Gabapentin capsules or tablets What is this medicine? GABAPENTIN (GA ba pen tin) is used to control seizures in certain types of epilepsy. It is also used to treat certain types of nerve pain. This medicine may be used for other purposes; ask your health care provider or pharmacist if you have questions. COMMON BRAND NAME(S): Active-PAC with Gabapentin, Gabarone, Neurontin What should I tell my health care provider before I take this medicine?  They need to know if you have any of these conditions:  history of drug abuse or alcohol abuse problem  kidney disease  lung or breathing disease  suicidal thoughts, plans, or attempt; a previous suicide attempt by you or a family member  an unusual or allergic reaction to gabapentin, other medicines, foods, dyes, or preservatives  pregnant or trying to get pregnant  breast-feeding How  should I use this medicine? Take this medicine by mouth with a glass of water. Follow the directions on the prescription label. You can take it with or without food. If it upsets your stomach, take it with food. Take your medicine at regular intervals. Do not take it more often than directed. Do not stop taking except on your doctor's advice. If you are directed to break the 600 or 800 mg tablets in half as part of your dose, the extra half tablet should be used for the next dose. If you have not used the extra half tablet within 28 days, it should be thrown away. A special MedGuide will be given to you by the pharmacist with each prescription and refill. Be sure to read this information carefully each time. Talk to your pediatrician regarding the use of this medicine in children. While this drug may be prescribed for children as young as 3 years for selected conditions, precautions do apply. Overdosage: If you think you have taken too much of this medicine contact a poison control center or emergency room at once. NOTE: This medicine is only for you. Do not share this medicine with others. What if I miss a dose? If you miss a dose, take it as soon as you can. If it is almost time for your next dose, take only that dose. Do not take double or extra doses. What may interact with this medicine? This medicine may interact with the following medications:  alcohol  antihistamines for allergy, cough, and cold  certain medicines for anxiety or sleep  certain medicines for depression like amitriptyline, fluoxetine, sertraline  certain medicines for seizures like phenobarbital, primidone  certain medicines for stomach problems  general anesthetics like halothane, isoflurane, methoxyflurane, propofol  local anesthetics like lidocaine, pramoxine, tetracaine  medicines that relax muscles for surgery  narcotic medicines for pain  phenothiazines like chlorpromazine, mesoridazine, prochlorperazine,  thioridazine This list may not describe all possible interactions. Give your health care provider a list of all the medicines, herbs, non-prescription drugs, or dietary supplements you use. Also tell them if you smoke, drink alcohol, or use illegal drugs. Some items may interact with your medicine. What should I watch for while using this medicine? Visit your doctor or health care provider for regular checks on your progress. You may want to keep a record at home of how you feel your condition is responding to treatment. You may want to share this information with your doctor or health care provider at each visit. You should contact your doctor or health care provider if your seizures get worse or if you have any new types of seizures. Do not stop taking this medicine or any of your seizure medicines unless instructed by your doctor or health care provider. Stopping your medicine suddenly can increase your seizures or their severity. This medicine may cause serious skin reactions. They can happen weeks to months after starting the medicine. Contact your health care provider right away if you notice fevers or flu-like symptoms with a rash. The rash may be red or purple and then turn into  blisters or peeling of the skin. Or, you might notice a red rash with swelling of the face, lips or lymph nodes in your neck or under your arms. Wear a medical identification bracelet or chain if you are taking this medicine for seizures, and carry a card that lists all your medications. You may get drowsy, dizzy, or have blurred vision. Do not drive, use machinery, or do anything that needs mental alertness until you know how this medicine affects you. To reduce dizzy or fainting spells, do not sit or stand up quickly, especially if you are an older patient. Alcohol can increase drowsiness and dizziness. Avoid alcoholic drinks. Your mouth may get dry. Chewing sugarless gum or sucking hard candy, and drinking plenty of water  will help. The use of this medicine may increase the chance of suicidal thoughts or actions. Pay special attention to how you are responding while on this medicine. Any worsening of mood, or thoughts of suicide or dying should be reported to your health care provider right away. Women who become pregnant while using this medicine may enroll in the [place] American Antiepileptic Drug Pregnancy Registry by calling [phone]. This registry collects information about the safety of antiepileptic drug use during pregnancy. What side effects may I notice from receiving this medicine? Side effects that you should report to your doctor or health care professional as soon as possible:  allergic reactions like skin rash, itching or hives, swelling of the face, lips, or tongue  breathing problems  rash, fever, and swollen lymph nodes  redness, blistering, peeling or loosening of the skin, including inside the mouth  suicidal thoughts, mood changes Side effects that usually do not require medical attention (report to your doctor or health care professional if they continue or are bothersome):  dizziness  drowsiness  headache  nausea, vomiting  swelling of ankles, feet, hands  tiredness This list may not describe all possible side effects. Call your doctor for medical advice about side effects. You may report side effects to FDA at 1-800-FDA-1088. Where should I keep my medicine? Keep out of reach of children. This medicine may cause accidental overdose and death if it taken by other adults, children, or pets. Mix any unused medicine with a substance like cat litter or coffee grounds. Then throw the medicine away in a sealed container like a sealed bag or a coffee can with a lid. Do not use the medicine after the expiration date. Store at room temperature between 15 and 30 degrees C (59 and 86 degrees F). NOTE: This sheet is a summary. It may not cover all possible information. If you have  questions about this medicine, talk to your doctor, pharmacist, or health care provider.  2020 Elsevier/Gold Standard (2018-05-13 14:16:43)

## 2019-04-24 MED ORDER — VITAMIN D (ERGOCALCIFEROL) 1.25 MG (50000 UNIT) PO CAPS: 50000.0000 [IU] | ORAL_CAPSULE | ORAL | 0 refills | Status: DC

## 2019-04-24 MED ORDER — FLUTICASONE PROPIONATE 50 MCG/ACT NA SUSP: NASAL | 1 refills | Status: DC

## 2019-04-24 NOTE — Addendum Note (Signed)
Addended by: Johnella Moloney on: 04/24/2019 10:07 AM   Modules accepted: Orders

## 2019-06-28 ENCOUNTER — Telehealth (INDEPENDENT_AMBULATORY_CARE_PROVIDER_SITE_OTHER): Payer: Medicare Other | Admitting: Family Medicine

## 2019-06-28 ENCOUNTER — Other Ambulatory Visit: Payer: Self-pay

## 2019-06-28 ENCOUNTER — Telehealth: Payer: Self-pay | Admitting: Family Medicine

## 2019-06-28 DIAGNOSIS — L299 Pruritus, unspecified: Secondary | ICD-10-CM

## 2019-06-28 DIAGNOSIS — L853 Xerosis cutis: Secondary | ICD-10-CM | POA: Diagnosis not present

## 2019-06-28 NOTE — Telephone Encounter (Signed)
Pt scheduled for telephone visit to discuss

## 2019-06-28 NOTE — Progress Notes (Signed)
Patient ID: Kevin Wise, male   DOB: 09-13-1935, 84 y.o.   MRN: [id number]  This visit type was conducted due to national recommendations for restrictions regarding the COVID-19 pandemic in an effort to limit this patient's exposure and mitigate transmission in our community.   Virtual Visit via Telephone Note  I connected with Juluis Pitch on 06/28/19 at  3:15 PM EDT by telephone and verified that I am speaking with the correct person using two identifiers.   I discussed the limitations, risks, security and privacy concerns of performing an evaluation and management service by telephone and the availability of in person appointments. I also discussed with the patient that there may be a patient responsible charge related to this service. The patient expressed understanding and agreed to proceed.  Location patient: home Location provider: work or home office Participants present for the call: patient, provider Patient did not have a visit in the prior 7 days to address this/these issue(s).   History of Present Illness: Mr. Liz has had chronic pruritus and dry skin for many months.  He has been here several times for that and has also seen dermatologist.  They had actually recommended gabapentin which he has taken at night without much relief.  He takes hydroxyzine at night which does seem to help but he has sedation with that.  He is tried antihistamines.  He has tried various moisturizers.  Currently using triamcinolone 0.1% cream.  He did call earlier today requesting something "stronger ".  We expressed our concerns about widespread use of stronger topical steroid because of increased risk of side effects.  He is currently using [employer].  He has not noted any hives.  Tries to avoid extremely hot showers and prolonged bathing.  Had recent liver functions in February with no evidence for any cholestasis.  Past Medical History:  Diagnosis Date  . ANEMIA 01/10/2008  . ARTHRITIS 01/06/2007   . CAROTID ARTERY STENOSIS, RIGHT 01/10/2009  . Diabetes mellitus without complication (HCC)   . ERECTILE DYSFUNCTION 01/06/2007  . HYPERGLYCEMIA, BORDERLINE 12/02/2009  . HYPERLIPIDEMIA 01/06/2007  . HYPERTENSION, BENIGN ESSENTIAL 01/06/2007  . HYPOTHYROIDISM 01/10/2008  . LIVER FUNCTION TESTS, ABNORMAL, HX OF 12/02/2009  . Other symptoms involving cardiovascular system 01/10/2009  . Rosacea 01/06/2007  . UNS ADVRS EFF OTH RX MEDICINAL&BIOLOGICAL SBSTNC 01/10/2008  . VITAMIN D DEFICIENCY 01/10/2008   Past Surgical History:  Procedure Laterality Date  . HERNIA REPAIR     bilateral inguinal   . TONSILLECTOMY      reports that he has never smoked. He has never used smokeless tobacco. He reports that he does not use drugs. No history on file for alcohol. family history includes Diabetes in his mother; Hyperlipidemia in his mother. No Known Allergies    Observations/Objective: Patient sounds cheerful and well on the phone. I do not appreciate any SOB. Speech and thought processing are grossly intact. Patient reported vitals:  Assessment and Plan:  Persistent pruritus.  Suspect dry skin dermatitis predominantly.  No evidence from recent labs for any cholestasis  -Reiterated the importance of avoiding prolonged bathing or extremely hot showers or baths -Continue low-dose hydroxyzine as needed at night -Recommend he consider Aveeno soap with oatmeal and also consider Aveeno moisturizer after bathing -Continue triamcinolone 0.1% cream as needed -He has follow-up in June and reassess then -Avoid scratching as much as possible  Follow Up Instructions:  -As above   99441 5-10 [id number] 11-20 [id number] 21-30 I did not refer this patient for  an OV in the next 24 hours for this/these issue(s).  I discussed the assessment and treatment plan with the patient. The patient was provided an opportunity to ask questions and all were answered. The patient agreed with the plan and demonstrated  an understanding of the instructions.   The patient was advised to call back or seek an in-person evaluation if the symptoms worsen or if the condition fails to improve as anticipated.  I provided 16 minutes of non-face-to-face time during this encounter.   Carolann Littler, MD

## 2019-06-28 NOTE — Telephone Encounter (Signed)
Consider setting up virtual follow-up if he would like.  My concern with going beyond group 3 strength topical steroid is that he is using this over a large portion of his body and there is certainly increased risk with higher strength.  Would like to discuss pros and cons and risk of higher use steroid before engaging

## 2019-06-28 NOTE — Telephone Encounter (Signed)
Please advise 

## 2019-06-28 NOTE — Telephone Encounter (Signed)
Patient is using a Rx creme and he his still not getting any relief. He would like for [facility] to send him in something stronger.  triamcinolone cream (KENALOG) 0.1 %     [id number] - [place], [place] - [address] AT [address] Phone:  [phone]  Fax:  [phone]

## 2019-07-10 ENCOUNTER — Other Ambulatory Visit: Payer: Self-pay | Admitting: Family Medicine

## 2019-07-20 ENCOUNTER — Other Ambulatory Visit: Payer: Self-pay

## 2019-07-21 ENCOUNTER — Ambulatory Visit (INDEPENDENT_AMBULATORY_CARE_PROVIDER_SITE_OTHER): Payer: Medicare Other | Admitting: Family Medicine

## 2019-07-21 ENCOUNTER — Encounter: Payer: Self-pay | Admitting: Family Medicine

## 2019-07-21 VITALS — BP 100/62 | HR 62 | Temp 97.8°F | Ht 67.0 in | Wt 159.0 lb

## 2019-07-21 DIAGNOSIS — E559 Vitamin D deficiency, unspecified: Secondary | ICD-10-CM

## 2019-07-21 DIAGNOSIS — E119 Type 2 diabetes mellitus without complications: Secondary | ICD-10-CM

## 2019-07-21 DIAGNOSIS — L299 Pruritus, unspecified: Secondary | ICD-10-CM | POA: Diagnosis not present

## 2019-07-21 LAB — HEMOGLOBIN A1C: Hgb A1c MFr Bld: 6.7 % — ABNORMAL HIGH (ref 4.6–6.5)

## 2019-07-21 LAB — VITAMIN D 25 HYDROXY (VIT D DEFICIENCY, FRACTURES): VITD: 67.51 ng/mL (ref 30.00–100.00)

## 2019-07-21 MED ORDER — LOSARTAN POTASSIUM 50 MG PO TABS
50.0000 mg | ORAL_TABLET | Freq: Every day | ORAL | 5 refills | Status: DC
Start: 2019-07-21 — End: 2019-08-14

## 2019-07-21 NOTE — Progress Notes (Signed)
Subjective:     Patient ID: Kevin Wise, male   DOB: [birth year], 84 y.o.   MRN: [id number]  HPI Mr. Riggsbee seen for the following issues  Type 2 diabetes.  Last A1c was 6.8%.  His diabetes has generally been fairly well controlled.  Remains on Metformin.  Requesting repeat A1c today  Longstanding history of diffuse pruritus.  He has tried multiple things including change of soaps, Aveeno, moisturizers, Atarax, steroid creams by prescription none with relief.  He has diffuse itching.  He was seen dermatologist couple times for this without any improvement.  No change of medications.  Prior liver function testing has been normal  He has some new area of rash on his buttocks bilaterally with some dry scaly patches  Vitamin D deficiency.  Last vitamin D level 16.  We started on replacement and he requests follow-up labs today  Past Medical History:  Diagnosis Date  . ANEMIA 01/10/2008  . ARTHRITIS 01/06/2007  . CAROTID ARTERY STENOSIS, RIGHT 01/10/2009  . Diabetes mellitus without complication (HCC)   . ERECTILE DYSFUNCTION 01/06/2007  . HYPERGLYCEMIA, BORDERLINE 12/02/2009  . HYPERLIPIDEMIA 01/06/2007  . HYPERTENSION, BENIGN ESSENTIAL 01/06/2007  . HYPOTHYROIDISM 01/10/2008  . LIVER FUNCTION TESTS, ABNORMAL, HX OF 12/02/2009  . Other symptoms involving cardiovascular system 01/10/2009  . Rosacea 01/06/2007  . UNS ADVRS EFF OTH RX MEDICINAL&BIOLOGICAL SBSTNC 01/10/2008  . VITAMIN D DEFICIENCY 01/10/2008   Past Surgical History:  Procedure Laterality Date  . HERNIA REPAIR     bilateral inguinal   . TONSILLECTOMY      reports that he has never smoked. He has never used smokeless tobacco. He reports that he does not use drugs. No history on file for alcohol. family history includes Diabetes in his mother; Hyperlipidemia in his mother. No Known Allergies   Review of Systems  Constitutional: Negative for fatigue.  Eyes: Negative for visual disturbance.  Respiratory: Negative  for cough, chest tightness and shortness of breath.   Cardiovascular: Negative for chest pain, palpitations and leg swelling.  Endocrine: Negative for polydipsia and polyuria.  Skin: Positive for rash.  Neurological: Negative for dizziness, syncope, weakness, light-headedness and headaches.       Objective:   Physical Exam Vitals reviewed.  Constitutional:      Appearance: Normal appearance.  Cardiovascular:     Rate and Rhythm: Normal rate and regular rhythm.  Pulmonary:     Effort: Pulmonary effort is normal.     Breath sounds: Normal breath sounds.  Skin:    Comments: Slightly dry skin.  A few scattered excoriations.  No other specific rash except for buttocks bilaterally as little bit of dry scaliness  Neurological:     Mental Status: He is alert.        Assessment:     #1 type 2 diabetes.  History of good control.  Stable with A1C of 6.7% today.  #2 longstanding history of generalized pruritus.  Etiology unclear.  May simply be dry skin but has tried moisturizers without improvement.  No evidence for cholestasis- by prior labs.  We did consider possibility of medications- though he has not had evidence for hives or fixed drug eruption type rash.  See below  #3 vitamin D deficiency- on replacement.     Plan:     -Check A1c and 25-hydroxy vitamin D level -Consider discontinue Accuretic and try losartan 50 mg daily -Reassess in 3 weeks to recheck BP.  Kristian Covey MD [facility]

## 2019-07-21 NOTE — Patient Instructions (Addendum)
STOP the Accuretic  Start Losartan 50 mg once daily.    Set up 3 week follow up

## 2019-08-11 ENCOUNTER — Other Ambulatory Visit: Payer: Self-pay

## 2019-08-11 ENCOUNTER — Ambulatory Visit: Payer: Medicare Other | Admitting: Family Medicine

## 2019-08-14 ENCOUNTER — Ambulatory Visit (INDEPENDENT_AMBULATORY_CARE_PROVIDER_SITE_OTHER): Payer: Medicare Other | Admitting: Family Medicine

## 2019-08-14 ENCOUNTER — Encounter: Payer: Self-pay | Admitting: Family Medicine

## 2019-08-14 ENCOUNTER — Other Ambulatory Visit: Payer: Self-pay

## 2019-08-14 VITALS — BP 116/62 | HR 65 | Temp 98.1°F | Wt 159.0 lb

## 2019-08-14 DIAGNOSIS — L299 Pruritus, unspecified: Secondary | ICD-10-CM | POA: Diagnosis not present

## 2019-08-14 DIAGNOSIS — S5011XA Contusion of right forearm, initial encounter: Secondary | ICD-10-CM | POA: Diagnosis not present

## 2019-08-14 DIAGNOSIS — E559 Vitamin D deficiency, unspecified: Secondary | ICD-10-CM | POA: Diagnosis not present

## 2019-08-14 DIAGNOSIS — I10 Essential (primary) hypertension: Secondary | ICD-10-CM | POA: Diagnosis not present

## 2019-08-14 MED ORDER — LOSARTAN POTASSIUM 50 MG PO TABS: 50.0000 mg | ORAL_TABLET | Freq: Every day | ORAL | 3 refills | Status: DC

## 2019-08-14 NOTE — Patient Instructions (Signed)
Try holding the Metformin for one week to see if pruritis improves  If it does not, go back on the Metformin and if it does help let me know.

## 2019-08-14 NOTE — Progress Notes (Signed)
Established Patient Office Visit  Subjective:  Patient ID: Kevin Wise, male    DOB: [birth year]  Age: 84 y.o. MRN: [id number]  CC:  Chief Complaint  Patient presents with  . Follow-up    pt is here for 3 week follow up on BP no symptoms     HPI Kevin Wise presents for medical follow-up  He has had chronic pruritus and has seen multiple dermatologists without relief.  We switched his Accuretic to losartan to see if that made a difference but he has not had any change in pruritus.  His blood pressure has remained well controlled.  He had recent very low vitamin D level 16 and we put him on prescription replacement.  Follow-up level 67.  We had recommended transition to oral over-the-counter.    He had a recent ER visit for localized swelling right forearm.  He had D-dimer which is normal.  Hemoglobin is 10.5.  Normal white count.  Etiology was unclear.  He was prescribed topical Voltaren.  He apparently has some bruising in the area but denies any known trauma.  Bruising is resolving.  He has small area of swelling which has declined some in size.  Minimally tender.  Recent ER notes and labs reviewed.  There was no imaging done.  Past Medical History:  Diagnosis Date  . ANEMIA 01/10/2008  . ARTHRITIS 01/06/2007  . CAROTID ARTERY STENOSIS, RIGHT 01/10/2009  . Diabetes mellitus without complication (HCC)   . ERECTILE DYSFUNCTION 01/06/2007  . HYPERGLYCEMIA, BORDERLINE 12/02/2009  . HYPERLIPIDEMIA 01/06/2007  . HYPERTENSION, BENIGN ESSENTIAL 01/06/2007  . HYPOTHYROIDISM 01/10/2008  . LIVER FUNCTION TESTS, ABNORMAL, HX OF 12/02/2009  . Other symptoms involving cardiovascular system 01/10/2009  . Rosacea 01/06/2007  . UNS ADVRS EFF OTH RX MEDICINAL&BIOLOGICAL SBSTNC 01/10/2008  . VITAMIN D DEFICIENCY 01/10/2008   Past Surgical History:  Procedure Laterality Date  . HERNIA REPAIR     bilateral inguinal   . TONSILLECTOMY      reports that he has never smoked. He has never  used smokeless tobacco. He reports that he does not use drugs. No history on file for alcohol use. family history includes Diabetes in his mother; Hyperlipidemia in his mother. No Known Allergies   Past Medical History:  Diagnosis Date  . ANEMIA 01/10/2008  . ARTHRITIS 01/06/2007  . CAROTID ARTERY STENOSIS, RIGHT 01/10/2009  . Diabetes mellitus without complication (HCC)   . ERECTILE DYSFUNCTION 01/06/2007  . HYPERGLYCEMIA, BORDERLINE 12/02/2009  . HYPERLIPIDEMIA 01/06/2007  . HYPERTENSION, BENIGN ESSENTIAL 01/06/2007  . HYPOTHYROIDISM 01/10/2008  . LIVER FUNCTION TESTS, ABNORMAL, HX OF 12/02/2009  . Other symptoms involving cardiovascular system 01/10/2009  . Rosacea 01/06/2007  . UNS ADVRS EFF OTH RX MEDICINAL&BIOLOGICAL SBSTNC 01/10/2008  . VITAMIN D DEFICIENCY 01/10/2008    Past Surgical History:  Procedure Laterality Date  . HERNIA REPAIR     bilateral inguinal   . TONSILLECTOMY      Family History  Problem Relation Age of Onset  . Hyperlipidemia Mother   . Diabetes Mother     Social History   Socioeconomic History  . Marital status: Married    Spouse name: Not on file  . Number of children: Not on file  . Years of education: Not on file  . Highest education level: Not on file  Occupational History  . Not on file  Tobacco Use  . Smoking status: Never Smoker  . Smokeless tobacco: Never Used  Vaping Use  . Vaping Use: Never  used  Substance and Sexual Activity  . Alcohol use: Not on file  . Drug use: No  . Sexual activity: Not on file  Other Topics Concern  . Not on file  Social History Narrative  . Not on file   Social Determinants of Health   Financial Resource Strain:   . Difficulty of Paying Living Expenses:   Food Insecurity:   . Worried About Charity fundraiser in the Last Year:   . Arboriculturist in the Last Year:   Transportation Needs:   . Film/video editor (Medical):   Marland Kitchen Lack of Transportation (Non-Medical):   Physical Activity:    . Days of Exercise per Week:   . Minutes of Exercise per Session:   Stress:   . Feeling of Stress :   Social Connections:   . Frequency of Communication with Friends and Family:   . Frequency of Social Gatherings with Friends and Family:   . Attends Religious Services:   . Active Member of Clubs or Organizations:   . Attends Archivist Meetings:   Marland Kitchen Marital Status:   Intimate Partner Violence:   . Fear of Current or Ex-Partner:   . Emotionally Abused:   Marland Kitchen Physically Abused:   . Sexually Abused:     Outpatient Medications Prior to Visit  Medication Sig Dispense Refill  . aspirin 81 MG tablet Take 81 mg by mouth daily.      Marland Kitchen atorvastatin (LIPITOR) 40 MG tablet Take 1 tablet (40 mg total) by mouth daily. 90 tablet 3  . B-D TB SYRINGE 1CC/27GX1/2" 27G X 1/2" 1 ML MISC USE AS DIRECTED WITH METHOTREXATE    . fluticasone (FLONASE) 50 MCG/ACT nasal spray INSERT 2 SPRAYS IN EACH NOSTRIL EVERY DAY 48 g 1  . folic acid (FOLVITE) 1 MG tablet Take 1 mg by mouth daily.    Marland Kitchen gabapentin (NEURONTIN) 300 MG capsule Take 1 capsule (300 mg total) by mouth at bedtime. 90 capsule 3  . glucose blood (ONETOUCH VERIO) test strip Use as instructed to check blood sugars 2 times daily. Dx Code E11.9 100 each 3  . hydrOXYzine (ATARAX/VISTARIL) 25 MG tablet Take 1 tablet (25 mg total) by mouth every 8 (eight) hours as needed. 90 tablet 3  . latanoprost (XALATAN) 0.005 % ophthalmic solution PLACE 1 DROP IN BOTH EYES AT BEDTIME  3  . lidocaine-hydrocortisone (ANAMANTEL HC) 3-0.5 % CREA Place 1 Applicatorful rectally 2 (two) times daily. 7 g 2  . metFORMIN (GLUCOPHAGE) 850 MG tablet TAKE 1 TABLET BY MOUTH TWICE A DAY WITH MEAL 180 tablet 2  . methotrexate 50 MG/2ML injection INJECT 0.8ML WEEKLY    . Methotrexate Sodium (METHOTREXATE, PF,) 50 MG/2ML injection 0.6ML ONCE A WEEK INJECTION 90 DAYS    . OneTouch Delica Lancets 40J MISC Use as instructed to check blood sugars 2 times daily. Dx Code E11.9 200  each 3  . timolol (TIMOPTIC) 0.5 % ophthalmic solution INSTILL 1 DROP INTO BOTH EYES TWICE A DAY    . triamcinolone cream (KENALOG) 0.1 % APPLY 1 APPLICATION 2 TIMES DAILY 454 g 1  . Vitamin D, Ergocalciferol, (DRISDOL) 1.25 MG (50000 UNIT) CAPS capsule Take 1 capsule (50,000 Units total) by mouth every 7 (seven) days. 12 capsule 0  . losartan (COZAAR) 50 MG tablet Take 1 tablet (50 mg total) by mouth daily. 30 tablet 5  . potassium gluconate 595 (99 K) MG TABS tablet Take by mouth.     No  facility-administered medications prior to visit.    No Known Allergies  ROS Review of Systems  Constitutional: Negative for fatigue.  Eyes: Negative for visual disturbance.  Respiratory: Negative for cough, chest tightness and shortness of breath.   Cardiovascular: Negative for chest pain, palpitations and leg swelling.  Neurological: Negative for dizziness, syncope, weakness, light-headedness and headaches.      Objective:    Physical Exam Constitutional:      Appearance: He is well-developed.  HENT:     Right Ear: External ear normal.     Left Ear: External ear normal.  Eyes:     Pupils: Pupils are equal, round, and reactive to light.  Neck:     Thyroid: No thyromegaly.  Cardiovascular:     Rate and Rhythm: Normal rate and regular rhythm.  Pulmonary:     Effort: Pulmonary effort is normal. No respiratory distress.     Breath sounds: Normal breath sounds. No wheezing or rales.  Musculoskeletal:     Cervical back: Neck supple.     Comments: Right forearm examined.  He has localized area of mild induration approximately 1 x 1 cm volar surface right forearm.  No surrounding ecchymosis at this time.  No warmth.  Nontender.  No fluctuance.  Neurological:     Mental Status: He is alert and oriented to person, place, and time.     BP 116/62 (BP Location: Left Arm, Patient Position: Sitting, Cuff Size: Normal)   Pulse 65   Temp 98.1 F (36.7 C) (Temporal)   Wt 159 lb (72.1 kg)   SpO2  98%   BMI 24.90 kg/m  Wt Readings from Last 3 Encounters:  08/14/19 159 lb (72.1 kg)  07/21/19 159 lb (72.1 kg)  04/21/19 162 lb 12.8 oz (73.8 kg)     Health Maintenance Due  Topic Date Due  . OPHTHALMOLOGY EXAM  03/29/2019    There are no preventive care reminders to display for this patient.  Lab Results  Component Value Date   TSH 0.74 04/21/2019   Lab Results  Component Value Date   WBC 5.9 04/21/2019   HGB 11.9 (L) 04/21/2019   HCT 35.7 (L) 04/21/2019   MCV 95.6 04/21/2019   PLT 316.0 04/21/2019   Lab Results  Component Value Date   NA 138 04/21/2019   K 4.1 04/21/2019   CO2 29 04/21/2019   GLUCOSE 92 04/21/2019   BUN 17 04/21/2019   CREATININE 1.10 04/21/2019   BILITOT 0.7 04/21/2019   ALKPHOS 66 04/21/2019   AST 20 04/21/2019   ALT 21 04/21/2019   PROT 6.9 04/21/2019   ALBUMIN 4.3 04/21/2019   CALCIUM 9.6 04/21/2019   GFR 77.68 04/21/2019   Lab Results  Component Value Date   CHOL 121 04/21/2019   Lab Results  Component Value Date   HDL 33.70 (L) 04/21/2019   Lab Results  Component Value Date   LDLCALC 63 04/21/2019   Lab Results  Component Value Date   TRIG 120.0 04/21/2019   Lab Results  Component Value Date   CHOLHDL 4 04/21/2019   Lab Results  Component Value Date   HGBA1C 6.7 (H) 07/21/2019      Assessment & Plan:   #1 hypertension stable with recent change from Accuretic to losartan.  Unfortunately, his pruritus has not improved.  We refilled the losartan for #90 with 3 refills  #2 low vitamin D.  Improved with recent prescription replacement -Transition to over-the-counter vitamin D 1000 to 2000 international units once  daily  #3 probable small hematoma right forearm.  Recent evaluation as above. -Recommend warm compresses 2-3 times daily and follow-up if this is not resolving over the next several weeks  #4 chronic generalized pruritus of unclear etiology -Doubtful medication related but patient will try holding  Metformin for 1 to 2 weeks to see if this makes any change in symptoms.  Monitor blood sugars closely  Meds ordered this encounter  Medications  . DISCONTD: losartan (COZAAR) 50 MG tablet    Sig: Take 1 tablet (50 mg total) by mouth daily.    Dispense:  90 tablet    Refill:  3  . losartan (COZAAR) 50 MG tablet    Sig: Take 1 tablet (50 mg total) by mouth daily.    Dispense:  90 tablet    Refill:  3    Follow-up: No follow-ups on file.    Evelena Peat, MD

## 2019-08-21 ENCOUNTER — Telehealth: Payer: Self-pay | Admitting: Family Medicine

## 2019-08-21 NOTE — Telephone Encounter (Signed)
Lvm for pt to call back. 

## 2019-08-21 NOTE — Telephone Encounter (Signed)
His recent TSH was normal, so not sure what their concern would be

## 2019-08-21 NOTE — Telephone Encounter (Signed)
Please advise 

## 2019-08-21 NOTE — Telephone Encounter (Signed)
Pt given information and has understanding

## 2019-08-21 NOTE — Telephone Encounter (Signed)
FYI- Pt followed up with his dermatologist last week.  Dermatologist is concerned with his TSH results. Pt is not sure if he needs to come back and see Dr. [name]? Thanks

## 2019-09-06 ENCOUNTER — Telehealth: Payer: Self-pay | Admitting: Family Medicine

## 2019-09-06 MED ORDER — TRIAMCINOLONE ACETONIDE 0.1 % EX CREA: TOPICAL_CREAM | CUTANEOUS | 1 refills | Status: DC

## 2019-09-06 NOTE — Telephone Encounter (Signed)
Refill sent in

## 2019-09-06 NOTE — Telephone Encounter (Signed)
Refill okay?  

## 2019-09-06 NOTE — Telephone Encounter (Signed)
Pt is requesting a refill on Triamcinolone cream 0.1%. [facility] uses [facility] Lynford Humphrey

## 2019-09-06 NOTE — Addendum Note (Signed)
Addended by: Raiford Simmonds R on: 09/06/2019 12:50 PM   Modules accepted: Orders

## 2019-09-06 NOTE — Telephone Encounter (Signed)
Please advise if okay?

## 2019-09-12 LAB — CBC AND DIFFERENTIAL
HCT: 33 — AB (ref 41–53)
Hemoglobin: 11.5 — AB (ref 13.5–17.5)
WBC: 5.2

## 2019-09-12 LAB — CBC: RBC: 3.57 — AB (ref 3.87–5.11)

## 2019-09-20 ENCOUNTER — Encounter: Payer: Self-pay | Admitting: Family Medicine

## 2019-09-20 ENCOUNTER — Telehealth: Payer: Self-pay | Admitting: Family Medicine

## 2019-09-20 NOTE — Telephone Encounter (Signed)
He has what is called a normocytic anemia and his hemoglobin has actually been very stable in this range for several years.  Unlikely of any major clinical significance.  We could check serum iron level at follow-up visit but no urgency to check this right away.  They actually checked the serum ferritin which is a storage form of iron and this came back normal so this does not look like any kind of iron deficiency and suspect no further evaluation necessary.

## 2019-09-20 NOTE — Telephone Encounter (Signed)
Pt is calling in stating that he had some lab work done at his dermatologist [facility]) and the lab work showed that he is a little anemia.  Pt would like to know what he should do or if he should worry about it.  Pt would like to have a call back to let him know the game plan and if we have received the lab work he will go by the dermatologist and get a copy and bring it in to [place].

## 2019-09-20 NOTE — Telephone Encounter (Signed)
Pt given information and he does have understanding

## 2019-09-20 NOTE — Telephone Encounter (Signed)
Please advise placed in green folder

## 2019-10-17 ENCOUNTER — Other Ambulatory Visit: Payer: Self-pay

## 2019-10-17 ENCOUNTER — Ambulatory Visit (INDEPENDENT_AMBULATORY_CARE_PROVIDER_SITE_OTHER): Payer: Medicare Other | Admitting: Family Medicine

## 2019-10-17 ENCOUNTER — Encounter: Payer: Self-pay | Admitting: Family Medicine

## 2019-10-17 VITALS — BP 126/72 | HR 57 | Temp 98.0°F | Wt 160.4 lb

## 2019-10-17 DIAGNOSIS — S76311A Strain of muscle, fascia and tendon of the posterior muscle group at thigh level, right thigh, initial encounter: Secondary | ICD-10-CM | POA: Diagnosis not present

## 2019-10-17 DIAGNOSIS — I10 Essential (primary) hypertension: Secondary | ICD-10-CM | POA: Diagnosis not present

## 2019-10-17 DIAGNOSIS — E119 Type 2 diabetes mellitus without complications: Secondary | ICD-10-CM | POA: Diagnosis not present

## 2019-10-17 DIAGNOSIS — L299 Pruritus, unspecified: Secondary | ICD-10-CM

## 2019-10-17 NOTE — Progress Notes (Signed)
Established Patient Office Visit  Subjective:  Patient ID: Kevin Wise, male    DOB: [birth year]  Age: 84 y.o. MRN: [id number]  CC:  Chief Complaint  Patient presents with  . Follow-up    pt is here for 3 month follow up     HPI Kevin Wise presents for medical follow-up  Type 2 diabetes.  History of good control.  No recent polyuria or polydipsia.  He remains on Metformin.  He gets yearly eye exams.  Recent issue of right hamstring soreness.  Denies any specific injury.  Able to ambulate without difficulties.  No low back pain.  No definite weakness.  No recent falls.  He continues to have chronic itching.  He has seen multiple specialists for this including currently dermatology.  Cannot seem to put a finger on any specific triggers.  He has considered whether he could have some type of unidentified allergen.  He has not had recurrent hives. No recent allergy testing.  Cannot correlate with any specific foods.   He has hypertension treated with losartan.  Blood pressures been well controlled.  No recent headaches or dizziness.  He remains on atorvastatin for hyperlipidemia.  He does take methotrexate and is a candidate for Covid booster this fall  He is having some problems with urine urgency but no burning with urination.  This has not been particularly disruptive of sleep thus far.  Tends to avoid caffeine.  No alcohol use.  Past Medical History:  Diagnosis Date  . ANEMIA 01/10/2008  . ARTHRITIS 01/06/2007  . CAROTID ARTERY STENOSIS, RIGHT 01/10/2009  . Diabetes mellitus without complication (HCC)   . ERECTILE DYSFUNCTION 01/06/2007  . HYPERGLYCEMIA, BORDERLINE 12/02/2009  . HYPERLIPIDEMIA 01/06/2007  . HYPERTENSION, BENIGN ESSENTIAL 01/06/2007  . HYPOTHYROIDISM 01/10/2008  . LIVER FUNCTION TESTS, ABNORMAL, HX OF 12/02/2009  . Other symptoms involving cardiovascular system 01/10/2009  . Rosacea 01/06/2007  . UNS ADVRS EFF OTH RX MEDICINAL&BIOLOGICAL SBSTNC  01/10/2008  . VITAMIN D DEFICIENCY 01/10/2008    Past Surgical History:  Procedure Laterality Date  . HERNIA REPAIR     bilateral inguinal   . TONSILLECTOMY      Family History  Problem Relation Age of Onset  . Hyperlipidemia Mother   . Diabetes Mother     Social History   Socioeconomic History  . Marital status: Married    Spouse name: Not on file  . Number of children: Not on file  . Years of education: Not on file  . Highest education level: Not on file  Occupational History  . Not on file  Tobacco Use  . Smoking status: Never Smoker  . Smokeless tobacco: Never Used  Vaping Use  . Vaping Use: Never used  Substance and Sexual Activity  . Alcohol use: Not on file  . Drug use: No  . Sexual activity: Not on file  Other Topics Concern  . Not on file  Social History Narrative  . Not on file   Social Determinants of Health   Financial Resource Strain:   . Difficulty of Paying Living Expenses: Not on file  Food Insecurity:   . Worried About Programme researcher, broadcasting/film/video in the Last Year: Not on file  . Ran Out of Food in the Last Year: Not on file  Transportation Needs:   . Lack of Transportation (Medical): Not on file  . Lack of Transportation (Non-Medical): Not on file  Physical Activity:   . Days of Exercise per Week: Not on  file  . Minutes of Exercise per Session: Not on file  Stress:   . Feeling of Stress : Not on file  Social Connections:   . Frequency of Communication with Friends and Family: Not on file  . Frequency of Social Gatherings with Friends and Family: Not on file  . Attends Religious Services: Not on file  . Active Member of Clubs or Organizations: Not on file  . Attends Banker Meetings: Not on file  . Marital Status: Not on file  Intimate Partner Violence:   . Fear of Current or Ex-Partner: Not on file  . Emotionally Abused: Not on file  . Physically Abused: Not on file  . Sexually Abused: Not on file    Outpatient Medications  Prior to Visit  Medication Sig Dispense Refill  . aspirin 81 MG tablet Take 81 mg by mouth daily.      Marland Kitchen atorvastatin (LIPITOR) 40 MG tablet Take 1 tablet (40 mg total) by mouth daily. 90 tablet 3  . B-D TB SYRINGE 1CC/27GX1/2" 27G X 1/2" 1 ML MISC USE AS DIRECTED WITH METHOTREXATE    . fluticasone (FLONASE) 50 MCG/ACT nasal spray INSERT 2 SPRAYS IN EACH NOSTRIL EVERY DAY 48 g 1  . folic acid (FOLVITE) 1 MG tablet Take 1 mg by mouth daily.    Marland Kitchen gabapentin (NEURONTIN) 300 MG capsule Take 1 capsule (300 mg total) by mouth at bedtime. 90 capsule 3  . glucose blood (ONETOUCH VERIO) test strip Use as instructed to check blood sugars 2 times daily. Dx Code E11.9 100 each 3  . hydrOXYzine (ATARAX/VISTARIL) 25 MG tablet Take 1 tablet (25 mg total) by mouth every 8 (eight) hours as needed. 90 tablet 3  . latanoprost (XALATAN) 0.005 % ophthalmic solution PLACE 1 DROP IN BOTH EYES AT BEDTIME  3  . lidocaine-hydrocortisone (ANAMANTEL HC) 3-0.5 % CREA Place 1 Applicatorful rectally 2 (two) times daily. 7 g 2  . losartan (COZAAR) 50 MG tablet Take 1 tablet (50 mg total) by mouth daily. 90 tablet 3  . metFORMIN (GLUCOPHAGE) 850 MG tablet TAKE 1 TABLET BY MOUTH TWICE A DAY WITH MEAL 180 tablet 2  . methotrexate 50 MG/2ML injection INJECT 0.8ML WEEKLY    . Methotrexate Sodium (METHOTREXATE, PF,) 50 MG/2ML injection 0.6ML ONCE A WEEK INJECTION 90 DAYS    . OneTouch Delica Lancets 33G MISC Use as instructed to check blood sugars 2 times daily. Dx Code E11.9 200 each 3  . timolol (TIMOPTIC) 0.5 % ophthalmic solution INSTILL 1 DROP INTO BOTH EYES TWICE A DAY    . triamcinolone cream (KENALOG) 0.1 % APPLY 1 APPLICATION 2 TIMES DAILY 454 g 1  . Vitamin D, Ergocalciferol, (DRISDOL) 1.25 MG (50000 UNIT) CAPS capsule Take 1 capsule (50,000 Units total) by mouth every 7 (seven) days. 12 capsule 0   No facility-administered medications prior to visit.    No Known Allergies  ROS Review of Systems  Constitutional:  Negative for fatigue.  Eyes: Negative for visual disturbance.  Respiratory: Negative for cough, chest tightness and shortness of breath.   Cardiovascular: Negative for chest pain, palpitations and leg swelling.  Genitourinary: Positive for urgency. Negative for dysuria.  Skin: Negative for rash.  Neurological: Negative for dizziness, syncope, weakness, light-headedness and headaches.      Objective:    Physical Exam Constitutional:      Appearance: He is well-developed.  HENT:     Right Ear: External ear normal.     Left Ear: External  ear normal.  Eyes:     Pupils: Pupils are equal, round, and reactive to light.  Neck:     Thyroid: No thyromegaly.  Cardiovascular:     Rate and Rhythm: Normal rate and regular rhythm.  Pulmonary:     Effort: Pulmonary effort is normal. No respiratory distress.     Breath sounds: Normal breath sounds. No wheezing or rales.  Musculoskeletal:     Cervical back: Neck supple.     Right lower leg: No edema.     Left lower leg: No edema.  Neurological:     Mental Status: He is alert and oriented to person, place, and time.     BP 126/72 (BP Location: Left Arm, Patient Position: Sitting, Cuff Size: Normal)   Pulse (!) 57   Temp 98 F (36.7 C) (Oral)   Wt 160 lb 6.4 oz (72.8 kg)   SpO2 98%   BMI 25.12 kg/m  Wt Readings from Last 3 Encounters:  10/17/19 160 lb 6.4 oz (72.8 kg)  08/14/19 159 lb (72.1 kg)  07/21/19 159 lb (72.1 kg)     Health Maintenance Due  Topic Date Due  . OPHTHALMOLOGY EXAM  03/29/2019  . INFLUENZA VACCINE  09/24/2019    There are no preventive care reminders to display for this patient.  Lab Results  Component Value Date   TSH 0.74 04/21/2019   Lab Results  Component Value Date   WBC 5.2 09/12/2019   HGB 11.5 (A) 09/12/2019   HCT 33 (A) 09/12/2019   MCV 95.6 04/21/2019   PLT 316.0 04/21/2019   Lab Results  Component Value Date   NA 138 04/21/2019   K 4.1 04/21/2019   CO2 29 04/21/2019   GLUCOSE 92  04/21/2019   BUN 17 04/21/2019   CREATININE 1.10 04/21/2019   BILITOT 0.7 04/21/2019   ALKPHOS 66 04/21/2019   AST 20 04/21/2019   ALT 21 04/21/2019   PROT 6.9 04/21/2019   ALBUMIN 4.3 04/21/2019   CALCIUM 9.6 04/21/2019   GFR 77.68 04/21/2019   Lab Results  Component Value Date   CHOL 121 04/21/2019   Lab Results  Component Value Date   HDL 33.70 (L) 04/21/2019   Lab Results  Component Value Date   LDLCALC 63 04/21/2019   Lab Results  Component Value Date   TRIG 120.0 04/21/2019   Lab Results  Component Value Date   CHOLHDL 4 04/21/2019   Lab Results  Component Value Date   HGBA1C 6.7 (H) 07/21/2019      Assessment & Plan:   #1 type 2 diabetes.  For some reason our point-of-care machine did not read his result.  We therefore drew this from the lab to further assess.  He is not having symptoms of polyuria or polydipsia so hopefully this is just a mechanical mishap  #2 hypertension stable and at goal  #3 right hamstring pain.  Symptoms relatively mild.  No reported injury  -We discussed gentle hamstring stretches  #4 chronic pruritus.  Lab work has been unrevealing.  No benefit thus far from dermatology input. -We did discuss consideration for possible referral to allergist and allergy testing.  He will consider.  No orders of the defined types were placed in this encounter.   Follow-up: Return in about 3 months (around 01/17/2020).    Evelena Peat, MD

## 2019-10-18 LAB — HEMOGLOBIN A1C
Hgb A1c MFr Bld: 6 % of total Hgb — ABNORMAL HIGH (ref <5.7)
Mean Plasma Glucose: 126 (calc)
eAG (mmol/L): 7 (calc)

## 2019-10-24 ENCOUNTER — Telehealth: Payer: Self-pay | Admitting: Family Medicine

## 2019-10-24 NOTE — Telephone Encounter (Signed)
Pt is wondering how much time he needs to wait between the Flu inj and covid booster?   Pt can be reached at [phone]

## 2019-10-25 NOTE — Telephone Encounter (Signed)
Yes- 2 to 3 weeks should be OK.

## 2019-10-25 NOTE — Telephone Encounter (Signed)
Please advise is it still atleast 2-3 weeks?

## 2019-10-26 NOTE — Telephone Encounter (Signed)
Patient notified of update  and verbalized understanding. 

## 2019-11-10 DIAGNOSIS — H409 Unspecified glaucoma: Secondary | ICD-10-CM | POA: Insufficient documentation

## 2019-11-10 DIAGNOSIS — K219 Gastro-esophageal reflux disease without esophagitis: Secondary | ICD-10-CM | POA: Insufficient documentation

## 2019-11-13 ENCOUNTER — Telehealth: Payer: Self-pay | Admitting: Family Medicine

## 2019-11-13 NOTE — Telephone Encounter (Signed)
I would go ahead with flu now and can get Covid booster in October- if available.

## 2019-11-13 NOTE — Telephone Encounter (Signed)
Please advise 

## 2019-11-13 NOTE — Telephone Encounter (Signed)
Pt is calling to see if he should get the COVID booster shot before the flu shot or vice versa.  Please advise

## 2019-11-14 ENCOUNTER — Telehealth: Payer: Self-pay | Admitting: Family Medicine

## 2019-11-14 NOTE — Telephone Encounter (Signed)
Patient called back for a response to his question- I relayed to the patient the message from Dr [name].  Patient stated understanding.

## 2019-11-14 NOTE — Telephone Encounter (Signed)
Noted  

## 2019-11-15 NOTE — Telephone Encounter (Signed)
disregard

## 2019-11-15 NOTE — Telephone Encounter (Signed)
Noted  

## 2019-11-24 ENCOUNTER — Ambulatory Visit (INDEPENDENT_AMBULATORY_CARE_PROVIDER_SITE_OTHER): Payer: Medicare Other | Admitting: Family Medicine

## 2019-11-24 ENCOUNTER — Encounter: Payer: Self-pay | Admitting: Family Medicine

## 2019-11-24 ENCOUNTER — Telehealth: Payer: Self-pay

## 2019-11-24 ENCOUNTER — Other Ambulatory Visit: Payer: Self-pay

## 2019-11-24 ENCOUNTER — Ambulatory Visit (INDEPENDENT_AMBULATORY_CARE_PROVIDER_SITE_OTHER): Payer: Medicare Other

## 2019-11-24 VITALS — BP 114/74 | HR 82 | Temp 98.2°F | Wt 164.9 lb

## 2019-11-24 DIAGNOSIS — M545 Low back pain, unspecified: Secondary | ICD-10-CM

## 2019-11-24 DIAGNOSIS — L299 Pruritus, unspecified: Secondary | ICD-10-CM | POA: Diagnosis not present

## 2019-11-24 DIAGNOSIS — D509 Iron deficiency anemia, unspecified: Secondary | ICD-10-CM | POA: Diagnosis not present

## 2019-11-24 NOTE — Telephone Encounter (Signed)
Patient called back and requested for his letter to be mailed to his home. I let patient know that I can and placing in mail today. Patient verbalized an understanding.

## 2019-11-24 NOTE — Telephone Encounter (Signed)
Called patient and he is going to try to set up his MyChart so we can scan the letter with Dr. Lucie Leather signature on it and send to him since he does not live close by. Patient verbalized an understanding.

## 2019-11-24 NOTE — Patient Instructions (Signed)
Recommend try over the counter iron sulfate 325 mg once daily  Recommend over the counter Vit D 1,000 to 2,000 IU once daily.

## 2019-11-24 NOTE — Progress Notes (Signed)
Established Patient Office Visit  Subjective:  Patient ID: Kevin Wise, male    DOB: 12-24-1935  Age: 84 y.o. MRN: [id number]  CC:  Chief Complaint  Patient presents with   Follow-up    Discuss derm results from [facility]    HPI CLINTON DRAGONE presents for medical follow-up to discuss recent visit with [facility] dermatology.  Patient basically has had some pruritus now for a few years and has been to multiple skin specialists.  We suspected he may have some neuropathic type pruritus.  He had extensive work-up at [facility] which included chest x-ray and multiple labs.  His labs came back significant for ferritin of 54, iron slightly low at 48 with normal TIBC of 357 and percent transferrin saturation low 13%.  Sed rate was normal.  He had vitamin D level low slightly low at 26.  Elevated PTH level.  He had multiple other labs including rule out for infectious causes such as HIV and hepatitis, autoimmune screens and these were negative.  Dermatologist had suggested given his low hemoglobin of 10.5 and slightly low iron consideration for further work-up of anemia.  He is scheduled for colonoscopy early next year which will be 10-year follow-up.  No recent abdominal pain.  No bloody stools.  No melena.  Patient has had low vitamin D in the past but currently not on replacement.  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   ARTHRITIS 01/06/2007   CAROTID ARTERY STENOSIS, RIGHT 01/10/2009   Diabetes mellitus without complication (HCC)    ERECTILE DYSFUNCTION 01/06/2007   HYPERGLYCEMIA, BORDERLINE 12/02/2009   HYPERLIPIDEMIA 01/06/2007   HYPERTENSION, BENIGN ESSENTIAL 01/06/2007   HYPOTHYROIDISM 01/10/2008   LIVER FUNCTION TESTS, ABNORMAL, HX OF 12/02/2009   Other symptoms involving cardiovascular system 01/10/2009   Rosacea 01/06/2007   UNS ADVRS EFF OTH RX MEDICINAL&BIOLOGICAL SBSTNC 01/10/2008   VITAMIN D DEFICIENCY 01/10/2008    Past Surgical History:  Procedure  Laterality Date   HERNIA REPAIR     bilateral inguinal    TONSILLECTOMY      Family History  Problem Relation Age of Onset   Hyperlipidemia Mother    Diabetes Mother     Social History   Socioeconomic History   Marital status: Married    Spouse name: Not on file   Number of children: Not on file   Years of education: Not on file   Highest education level: Not on file  Occupational History   Not on file  Tobacco Use   Smoking status: Never Smoker   Smokeless tobacco: Never Used  Vaping Use   Vaping Use: Never used  Substance and Sexual Activity   Alcohol use: Not on file   Drug use: No   Sexual activity: Not on file  Other Topics Concern   Not on file  Social History Narrative   Not on file   Social Determinants of Health   Financial Resource Strain:    Difficulty of Paying Living Expenses: Not on file  Food Insecurity:    Worried About Running Out of Food in the Last Year: Not on file   [employer] of Food in the Last Year: Not on file  Transportation Needs:    Lack of Transportation (Medical): Not on file   Lack of Transportation (Non-Medical): Not on file  Physical Activity:    Days of Exercise per Week: Not on file   Minutes of Exercise per Session: Not on file  Stress:  Feeling of Stress : Not on file  Social Connections:    Frequency of Communication with Friends and Family: Not on file   Frequency of Social Gatherings with Friends and Family: Not on file   Attends Religious Services: Not on file   Active Member of Clubs or Organizations: Not on file   Attends Banker Meetings: Not on file   Marital Status: Not on file  Intimate Partner Violence:    Fear of Current or Ex-Partner: Not on file   Emotionally Abused: Not on file   Physically Abused: Not on file   Sexually Abused: Not on file    Outpatient Medications Prior to Visit  Medication Sig Dispense Refill   aspirin 81 MG tablet Take 81 mg by  mouth daily.       atorvastatin (LIPITOR) 40 MG tablet Take 1 tablet (40 mg total) by mouth daily. 90 tablet 3   B-D TB SYRINGE 1CC/27GX1/2" 27G X 1/2" 1 ML MISC USE AS DIRECTED WITH METHOTREXATE     betamethasone dipropionate 0.05 % cream Apply 1 application topically 2 (two) times daily.     Cholecalciferol (VITAMIN D3) 10 MCG (400 UNIT) tablet Take by mouth.     clindamycin (CLINDAGEL) 1 % gel 1(ONE) APPLICATION(S) TOPICAL EVERY DAY     diclofenac Sodium (VOLTAREN) 1 % GEL SMARTSIG:Gram(s) Topical Twice Daily PRN     fluticasone (FLONASE) 50 MCG/ACT nasal spray INSERT 2 SPRAYS IN EACH NOSTRIL EVERY DAY 48 g 1   folic acid (FOLVITE) 1 MG tablet Take 1 mg by mouth daily.     glucose blood (ONETOUCH VERIO) test strip Use as instructed to check blood sugars 2 times daily. Dx Code E11.9 100 each 3   hydrOXYzine (ATARAX/VISTARIL) 25 MG tablet Take 1 tablet (25 mg total) by mouth every 8 (eight) hours as needed. 90 tablet 3   latanoprost (XALATAN) 0.005 % ophthalmic solution PLACE 1 DROP IN BOTH EYES AT BEDTIME  3   lidocaine-hydrocortisone (ANAMANTEL HC) 3-0.5 % CREA Place 1 Applicatorful rectally 2 (two) times daily. 7 g 2   Lidocaine-Hydrocortisone Ace 3-0.5 % CREA Apply topically.     losartan (COZAAR) 50 MG tablet Take 1 tablet (50 mg total) by mouth daily. 90 tablet 3   metFORMIN (GLUCOPHAGE) 850 MG tablet TAKE 1 TABLET BY MOUTH TWICE A DAY WITH MEAL 180 tablet 2   methotrexate 50 MG/2ML injection INJECT 0.8ML WEEKLY     OneTouch Delica Lancets 33G MISC Use as instructed to check blood sugars 2 times daily. Dx Code E11.9 200 each 3   timolol (TIMOPTIC) 0.5 % ophthalmic solution INSTILL 1 DROP INTO BOTH EYES TWICE A DAY     triamcinolone cream (KENALOG) 0.1 % APPLY 1 APPLICATION 2 TIMES DAILY 454 g 1   Vitamin D, Ergocalciferol, (DRISDOL) 1.25 MG (50000 UNIT) CAPS capsule Take 1 capsule (50,000 Units total) by mouth every 7 (seven) days. 12 capsule 0   gabapentin  (NEURONTIN) 300 MG capsule Take 1 capsule (300 mg total) by mouth at bedtime. (Patient not taking: Reported on 11/24/2019) 90 capsule 3   Methotrexate Sodium (METHOTREXATE, PF,) 50 MG/2ML injection 0.6ML ONCE A WEEK INJECTION 90 DAYS (Patient not taking: Reported on 11/24/2019)     No facility-administered medications prior to visit.    No Known Allergies  ROS Review of Systems  Constitutional: Negative for appetite change, chills, fever and unexpected weight change.  Respiratory: Negative for cough and shortness of breath.   Cardiovascular: Negative for chest  pain.  Gastrointestinal: Negative for abdominal pain, blood in stool, nausea and vomiting.      Objective:    Physical Exam  BP 114/74    Pulse 82    Temp 98.2 F (36.8 C) (Oral)    Wt 164 lb 14.4 oz (74.8 kg)    SpO2 98%    BMI 25.83 kg/m  Wt Readings from Last 3 Encounters:  11/24/19 164 lb 14.4 oz (74.8 kg)  10/17/19 160 lb 6.4 oz (72.8 kg)  08/14/19 159 lb (72.1 kg)     Health Maintenance Due  Topic Date Due   OPHTHALMOLOGY EXAM  03/29/2019   INFLUENZA VACCINE  09/24/2019    There are no preventive care reminders to display for this patient.  Lab Results  Component Value Date   TSH 0.74 04/21/2019   Lab Results  Component Value Date   WBC 5.2 09/12/2019   HGB 11.5 (A) 09/12/2019   HCT 33 (A) 09/12/2019   MCV 95.6 04/21/2019   PLT 316.0 04/21/2019   Lab Results  Component Value Date   NA 138 04/21/2019   K 4.1 04/21/2019   CO2 29 04/21/2019   GLUCOSE 92 04/21/2019   BUN 17 04/21/2019   CREATININE 1.10 04/21/2019   BILITOT 0.7 04/21/2019   ALKPHOS 66 04/21/2019   AST 20 04/21/2019   ALT 21 04/21/2019   PROT 6.9 04/21/2019   ALBUMIN 4.3 04/21/2019   CALCIUM 9.6 04/21/2019   GFR 77.68 04/21/2019   Lab Results  Component Value Date   CHOL 121 04/21/2019   Lab Results  Component Value Date   HDL 33.70 (L) 04/21/2019   Lab Results  Component Value Date   LDLCALC 63 04/21/2019   Lab  Results  Component Value Date   TRIG 120.0 04/21/2019   Lab Results  Component Value Date   CHOLHDL 4 04/21/2019   Lab Results  Component Value Date   HGBA1C 6.0 (H) 10/17/2019      Assessment & Plan:   #1 chronic pruritus.  Etiology unclear.  Recent extensive work-up per do dermatology.  Patient currently on gabapentin 600 mg 3 times daily  #2 mild iron deficiency.  His TIBC and ferritin were normal Will send correspondence letter to his GI specialist to see if they can move up his colonoscopy.  No orders of the defined types were placed in this encounter.   Follow-up: No follow-ups on file.    Evelena Peat, MD

## 2019-12-11 ENCOUNTER — Ambulatory Visit: Payer: Medicare Other

## 2019-12-27 ENCOUNTER — Other Ambulatory Visit: Payer: Self-pay

## 2019-12-27 NOTE — Progress Notes (Signed)
Erroneous encounter

## 2020-01-01 ENCOUNTER — Ambulatory Visit: Payer: Medicare Other

## 2020-01-25 NOTE — Progress Notes (Signed)
Subjective:   Kevin Wise is a 84 y.o. male who presents for Medicare Annual/Subsequent preventive examination.    Review of Systems    N/A  Cardiac Risk Factors include: advanced age (>31men, >72 women);male gender;dyslipidemia;diabetes mellitus     Objective:    Today's Vitals   01/26/20 0815  BP: 114/68  Pulse: 68  Temp: 97.8 F (36.6 C)  TempSrc: Oral  SpO2: 98%  Weight: 170 lb (77.1 kg)  Height: 5\' 7"  (1.702 m)   Body mass index is 26.63 kg/m.  Advanced Directives 01/26/2020  Does Patient Have a Medical Advance Directive? Yes  Type of 14/04/2019 of [place];Living will  Does patient want to make changes to medical advance directive? No - Patient declined    Current Medications (verified) Outpatient Encounter Medications as of 01/26/2020  Medication Sig  . atorvastatin (LIPITOR) 40 MG tablet Take 1 tablet (40 mg total) by mouth daily.  . B-D TB SYRINGE 1CC/27GX1/2" 27G X 1/2" 1 ML MISC USE AS DIRECTED WITH METHOTREXATE  . betamethasone dipropionate 0.05 % cream Apply 1 application topically 2 (two) times daily.  . Cholecalciferol (VITAMIN D3) 10 MCG (400 UNIT) tablet Take by mouth.  . clindamycin (CLINDAGEL) 1 % gel 1(ONE) APPLICATION(S) TOPICAL EVERY DAY  . diclofenac Sodium (VOLTAREN) 1 % GEL SMARTSIG:Gram(s) Topical Twice Daily PRN  . ferrous sulfate 325 (65 FE) MG EC tablet Take 325 mg by mouth 3 (three) times daily with meals.  . fluticasone (FLONASE) 50 MCG/ACT nasal spray INSERT 2 SPRAYS IN EACH NOSTRIL EVERY DAY  . folic acid (FOLVITE) 1 MG tablet Take 1 mg by mouth daily.  14/04/2019 glucose blood (ONETOUCH VERIO) test strip Use as instructed to check blood sugars 2 times daily. Dx Code E11.9  . hydrOXYzine (ATARAX/VISTARIL) 25 MG tablet Take 1 tablet (25 mg total) by mouth every 8 (eight) hours as needed.  . latanoprost (XALATAN) 0.005 % ophthalmic solution PLACE 1 DROP IN BOTH EYES AT BEDTIME  . lidocaine-hydrocortisone (ANAMANTEL HC)  3-0.5 % CREA Place 1 Applicatorful rectally 2 (two) times daily.  . Lidocaine-Hydrocortisone Ace 3-0.5 % CREA Apply topically.  Marland Kitchen losartan (COZAAR) 50 MG tablet Take 1 tablet (50 mg total) by mouth daily.  . metFORMIN (GLUCOPHAGE) 850 MG tablet TAKE 1 TABLET BY MOUTH TWICE A DAY WITH MEAL  . methotrexate 50 MG/2ML injection INJECT 0.8ML WEEKLY  . Methotrexate Sodium (METHOTREXATE, PF,) 50 MG/2ML injection 0.6ML ONCE A WEEK INJECTION 90 DAYS  . OneTouch Delica Lancets 33G MISC Use as instructed to check blood sugars 2 times daily. Dx Code E11.9  . timolol (TIMOPTIC) 0.5 % ophthalmic solution INSTILL 1 DROP INTO BOTH EYES TWICE A DAY  . triamcinolone cream (KENALOG) 0.1 % APPLY 1 APPLICATION 2 TIMES DAILY  . Vitamin D, Ergocalciferol, (DRISDOL) 1.25 MG (50000 UNIT) CAPS capsule Take 1 capsule (50,000 Units total) by mouth every 7 (seven) days.  Marland Kitchen gabapentin (NEURONTIN) 300 MG capsule Take 1 capsule (300 mg total) by mouth at bedtime. (Patient not taking: Reported on 11/24/2019)  . [DISCONTINUED] aspirin 81 MG tablet Take 81 mg by mouth daily.   (Patient not taking: Reported on 01/26/2020)   No facility-administered encounter medications on file as of 01/26/2020.    Allergies (verified) Patient has no known allergies.   History: Past Medical History:  Diagnosis Date  . ANEMIA 01/10/2008  . ARTHRITIS 01/06/2007  . CAROTID ARTERY STENOSIS, RIGHT 01/10/2009  . Diabetes mellitus without complication (HCC)   . ERECTILE DYSFUNCTION  01/06/2007  . HYPERGLYCEMIA, BORDERLINE 12/02/2009  . HYPERLIPIDEMIA 01/06/2007  . HYPERTENSION, BENIGN ESSENTIAL 01/06/2007  . HYPOTHYROIDISM 01/10/2008  . LIVER FUNCTION TESTS, ABNORMAL, HX OF 12/02/2009  . Other symptoms involving cardiovascular system 01/10/2009  . Rosacea 01/06/2007  . UNS ADVRS EFF OTH RX MEDICINAL&BIOLOGICAL SBSTNC 01/10/2008  . VITAMIN D DEFICIENCY 01/10/2008   Past Surgical History:  Procedure Laterality Date  . HERNIA REPAIR      bilateral inguinal   . TONSILLECTOMY     Family History  Problem Relation Age of Onset  . Hyperlipidemia Mother   . Diabetes Mother    Social History   Socioeconomic History  . Marital status: Married    Spouse name: Not on file  . Number of children: Not on file  . Years of education: Not on file  . Highest education level: Not on file  Occupational History  . Not on file  Tobacco Use  . Smoking status: Never Smoker  . Smokeless tobacco: Never Used  Vaping Use  . Vaping Use: Never used  Substance and Sexual Activity  . Alcohol use: Not on file  . Drug use: No  . Sexual activity: Not on file  Other Topics Concern  . Not on file  Social History Narrative  . Not on file   Social Determinants of Health   Financial Resource Strain: Low Risk   . Difficulty of Paying Living Expenses: Not hard at all  Food Insecurity: No Food Insecurity  . Worried About Programme researcher, broadcasting/film/video in the Last Year: Never true  . Ran Out of Food in the Last Year: Never true  Transportation Needs: No Transportation Needs  . Lack of Transportation (Medical): No  . Lack of Transportation (Non-Medical): No  Physical Activity: Inactive  . Days of Exercise per Week: 0 days  . Minutes of Exercise per Session: 0 min  Stress: No Stress Concern Present  . Feeling of Stress : Not at all  Social Connections: Moderately Integrated  . Frequency of Communication with Friends and Family: More than three times a week  . Frequency of Social Gatherings with Friends and Family: More than three times a week  . Attends Religious Services: More than 4 times per year  . Active Member of Clubs or Organizations: No  . Attends Banker Meetings: Never  . Marital Status: Married    Tobacco Counseling Counseling given: Not Answered   Clinical Intake:  Pre-visit preparation completed: Yes  Pain : No/denies pain     Nutritional Risks: None Diabetes: Yes CBG done?: No Did pt. bring in CBG monitor  from home?: No  How often do you need to have someone help you when you read instructions, pamphlets, or other written materials from your doctor or pharmacy?: 1 - Never What is the last grade level you completed in school?: College  Diabetic?Yes Nutrition Risk Assessment:  Has the patient had any N/V/D within the last 2 months?  No  Does the patient have any non-healing wounds?  No  Has the patient had any unintentional weight loss or weight gain?  No   Diabetes:  Is the patient diabetic?  Yes  If diabetic, was a CBG obtained today?  No  Did the patient bring in their glucometer from home?  No  How often do you monitor your CBG's? Patient states glucose 2-3 times per week.   Financial Strains and Diabetes Management:  Are you having any financial strains with the device, your supplies or your  medication? No .  Does the patient want to be seen by Chronic Care Management for management of their diabetes?  No  Would the patient like to be referred to a Nutritionist or for Diabetic Management?  No   Diabetic Exams:  Diabetic Eye Exam: Overdue for diabetic eye exam. Pt has been advised about the importance in completing this exam. Patient advised to call and schedule an eye exam. Diabetic Foot Exam: Completed 04/21/2019   Interpreter Needed?: No  Information entered by :: SCrews,LPN   Activities of Daily Living In your present state of health, do you have any difficulty performing the following activities: 01/26/2020  Hearing? N  Vision? N  Difficulty concentrating or making decisions? N  Walking or climbing stairs? N  Dressing or bathing? N  Doing errands, shopping? N  Preparing Food and eating ? N  Using the Toilet? N  In the past six months, have you accidently leaked urine? N  Do you have problems with loss of bowel control? N  Managing your Medications? N  Managing your Finances? N  Housekeeping or managing your Housekeeping? N  Some recent data might be hidden     Patient Care Team: Kristian Covey, MD as PCP - General (Family Medicine)  Indicate any recent Medical Services you may have received from other than [facility] providers in the past year (date may be approximate).     Assessment:   This is a routine wellness examination for Vivan.  Hearing/Vision screen  Hearing Screening             Right ear:           Left ear:           Vision Screening Comments: Patient states gets eyes examined every year. Has cataracts that are currently being monitored   Dietary issues and exercise activities discussed: Current Exercise Habits: The patient does not participate in regular exercise at present, Exercise limited by: None identified  Goals    . Exercise 3x per week (30 min per time)    . Patient Stated     I would like to maintain my health      Depression Screen PHQ 2/9 Scores 01/26/2020 04/21/2019 04/18/2018 10/11/2017 05/19/2016 06/17/2015 05/04/2014  PHQ - 2 Score 0 0 0 0 0 0 0  PHQ- 9 Score 0 0 0 - - - -  Exception Documentation - - - - - Patient refusal -    Fall Risk Fall Risk  01/26/2020 04/21/2019 04/18/2018 10/11/2017 05/19/2016  Falls in the past year? 0 0 0 No No  Number falls in past yr: 0 0 - - -  Injury with Fall? 0 0 - - -  Risk for fall due to : No Fall Risks - - - -  Follow up Falls evaluation completed;Falls prevention discussed - - - -    Any stairs in or around the home? No  If so, are there any without handrails? No  Home free of loose throw rugs in walkways, pet beds, electrical cords, etc? Yes  Adequate lighting in your home to reduce risk of falls? Yes   ASSISTIVE DEVICES UTILIZED TO PREVENT FALLS:  Life alert? No  Use of a cane, walker or w/c? No  Grab bars in the bathroom? Yes  Shower chair or bench in shower? Yes  Elevated toilet seat or a handicapped toilet? Yes   TIMED UP AND GO:  Was the test performed? Yes .  Length of time to  ambulate 10 feet: 5  sec.   Gait steady and fast without use of assistive device  Cognitive Function:  Cognitive screening not indicated based on direct observation      Immunizations Immunization History  Administered Date(s) Administered  . Fluad Quad(high Dose 65+) 10/27/2018, 12/22/2018  . Influenza Split 11/20/2010, 11/13/2011  . Influenza Whole 12/31/2006, 11/22/2008, 11/14/2009  . Influenza, High Dose Seasonal PF 11/23/2013, 11/12/2014, 12/09/2015, 12/11/2016, 11/19/2017  . Influenza,inj,Quad PF,6+ Mos 10/31/2012  . Influenza-Unspecified 11/23/2019  . Moderna SARS-COVID-2 Vaccination 03/06/2019, 04/03/2019  . Pneumococcal Conjugate-13 05/01/2013  . Pneumococcal Polysaccharide-23 03/01/2003  . Tdap 04/17/2010  . Zoster 03/06/2011  . Zoster Recombinat (Shingrix) 11/30/2016    TDAP status: Up to date Flu Vaccine status: Up to date Pneumococcal vaccine status: Up to date Covid-19 vaccine status: Completed vaccines  Qualifies for Shingles Vaccine? Yes   Zostavax completed Yes   Shingrix Completed?: Yes  Screening Tests Health Maintenance  Topic Date Due  . OPHTHALMOLOGY EXAM  03/29/2019  . TETANUS/TDAP  04/17/2020  . HEMOGLOBIN A1C  04/18/2020  . FOOT EXAM  04/20/2020  . INFLUENZA VACCINE  Completed  . COVID-19 Vaccine  Completed  . PNA vac Low Risk Adult  Completed    Health Maintenance  Health Maintenance Due  Topic Date Due  . OPHTHALMOLOGY EXAM  03/29/2019    Colorectal cancer screening: No longer required.   Lung Cancer Screening: (Low Dose CT Chest recommended if Age 29-80 years, 30 pack-year currently smoking OR have quit w/in 15years.) does not qualify.   Lung Cancer Screening Referral: N/A   Additional Screening:  Hepatitis C Screening: does not qualify;   Vision Screening: Recommended annual ophthalmology exams for early detection of glaucoma and other disorders of the eye. Is the patient up to date with their annual eye exam?  Yes  Who is the provider or what  is the name of the office in which the patient attends annual eye exams? Dr. Jimmey Ralph If pt is not established with a provider, would they like to be referred to a provider to establish care? No .   Dental Screening: Recommended annual dental exams for proper oral hygiene  Community Resource Referral / Chronic Care Management: CRR required this visit?  No   CCM required this visit?  No      Plan:     I have personally reviewed and noted the following in the patient's chart:   . Medical and social history . Use of alcohol, tobacco or illicit drugs  . Current medications and supplements . Functional ability and status . Nutritional status . Physical activity . Advanced directives . List of other physicians . Hospitalizations, surgeries, and ER visits in previous 12 months . Vitals . Screenings to include cognitive, depression, and falls . Referrals and appointments  In addition, I have reviewed and discussed with patient certain preventive protocols, quality metrics, and best practice recommendations. A written personalized care plan for preventive services as well as general preventive health recommendations were provided to patient.     Theodora Blow, LPN   [id number]   Nurse Notes: None

## 2020-01-26 ENCOUNTER — Encounter: Payer: Self-pay | Admitting: Family Medicine

## 2020-01-26 ENCOUNTER — Other Ambulatory Visit: Payer: Self-pay

## 2020-01-26 ENCOUNTER — Ambulatory Visit (INDEPENDENT_AMBULATORY_CARE_PROVIDER_SITE_OTHER): Payer: Medicare Other

## 2020-01-26 ENCOUNTER — Ambulatory Visit (INDEPENDENT_AMBULATORY_CARE_PROVIDER_SITE_OTHER): Payer: Medicare Other | Admitting: Family Medicine

## 2020-01-26 VITALS — BP 114/68 | HR 68 | Temp 97.8°F | Resp 16 | Ht 67.0 in | Wt 170.0 lb

## 2020-01-26 VITALS — BP 114/68 | HR 68 | Temp 97.8°F | Ht 67.0 in | Wt 170.0 lb

## 2020-01-26 DIAGNOSIS — Z0000 Encounter for general adult medical examination without abnormal findings: Secondary | ICD-10-CM

## 2020-01-26 DIAGNOSIS — D509 Iron deficiency anemia, unspecified: Secondary | ICD-10-CM | POA: Diagnosis not present

## 2020-01-26 DIAGNOSIS — E559 Vitamin D deficiency, unspecified: Secondary | ICD-10-CM | POA: Diagnosis not present

## 2020-01-26 DIAGNOSIS — E119 Type 2 diabetes mellitus without complications: Secondary | ICD-10-CM | POA: Diagnosis not present

## 2020-01-26 NOTE — Addendum Note (Signed)
Addended by: Lerry Liner on: 01/26/2020 09:45 AM   Modules accepted: Orders

## 2020-01-26 NOTE — Patient Instructions (Signed)
Kevin Wise , Thank you for taking time to come for your Medicare Wellness Visit. I appreciate your ongoing commitment to your health goals. Please review the following plan we discussed and let me know if I can assist you in the future.   Screening recommendations/referrals: Colonoscopy: Please keep scheduled appointment for colonoscopy  Recommended yearly ophthalmology/optometry visit for glaucoma screening and checkup Recommended yearly dental visit for hygiene and checkup  Vaccinations: Influenza vaccine: Up to date, next due fall 2022 Pneumococcal vaccine: Completed series  Tdap vaccine: Up to date, next due 04/17/2020 Shingles vaccine: Completed series    Advanced directives: Please bring copies of your advanced medical directives into our office so that we may scan them into your chart   Conditions/risks identified: None   Next appointment: 01/27/2021 @ 8:15 am with [facility] Nurse Health Advisor   Preventive Care 82 Years and Older, Male Preventive care refers to lifestyle choices and visits with your health care provider that can promote health and wellness. What does preventive care include?  A yearly physical exam. This is also called an annual well check.  Dental exams once or twice a year.  Routine eye exams. Ask your health care provider how often you should have your eyes checked.  Personal lifestyle choices, including:  Daily care of your teeth and gums.  Regular physical activity.  Eating a healthy diet.  Avoiding tobacco and drug use.  Limiting alcohol use.  Practicing safe sex.  Taking low doses of aspirin every day.  Taking vitamin and mineral supplements as recommended by your health care provider. What happens during an annual well check? The services and screenings done by your health care provider during your annual well check will depend on your age, overall health, lifestyle risk factors, and family history of disease. Counseling  Your health  care provider may ask you questions about your:  Alcohol use.  Tobacco use.  Drug use.  Emotional well-being.  Home and relationship well-being.  Sexual activity.  Eating habits.  History of falls.  Memory and ability to understand (cognition).  Work and work Astronomer. Screening  You may have the following tests or measurements:  Height, weight, and BMI.  Blood pressure.  Lipid and cholesterol levels. These may be checked every 5 years, or more frequently if you are over 23 years old.  Skin check.  Lung cancer screening. You may have this screening every year starting at age 68 if you have a 30-pack-year history of smoking and currently smoke or have quit within the past 15 years.  Fecal occult blood test (FOBT) of the stool. You may have this test every year starting at age 52.  Flexible sigmoidoscopy or colonoscopy. You may have a sigmoidoscopy every 5 years or a colonoscopy every 10 years starting at age 103.  Prostate cancer screening. Recommendations will vary depending on your family history and other risks.  Hepatitis C blood test.  Hepatitis B blood test.  Sexually transmitted disease (STD) testing.  Diabetes screening. This is done by checking your blood sugar (glucose) after you have not eaten for a while (fasting). You may have this done every 1-3 years.  Abdominal aortic aneurysm (AAA) screening. You may need this if you are a current or former smoker.  Osteoporosis. You may be screened starting at age 46 if you are at high risk. Talk with your health care provider about your test results, treatment options, and if necessary, the need for more tests. Vaccines  Your health care provider  may recommend certain vaccines, such as:  Influenza vaccine. This is recommended every year.  Tetanus, diphtheria, and acellular pertussis (Tdap, Td) vaccine. You may need a Td booster every 10 years.  Zoster vaccine. You may need this after age  82.  Pneumococcal 13-valent conjugate (PCV13) vaccine. One dose is recommended after age 14.  Pneumococcal polysaccharide (PPSV23) vaccine. One dose is recommended after age 78. Talk to your health care provider about which screenings and vaccines you need and how often you need them. This information is not intended to replace advice given to you by your health care provider. Make sure you discuss any questions you have with your health care provider. Document Released: 03/08/2015 Document Revised: 10/30/2015 Document Reviewed: 12/11/2014 Elsevier Interactive Patient Education  2017 [employer] Prevention in the Home Falls can cause injuries. They can happen to people of all ages. There are many things you can do to make your home safe and to help prevent falls. What can I do on the outside of my home?  Regularly fix the edges of walkways and driveways and fix any cracks.  Remove anything that might make you trip as you walk through a door, such as a raised step or threshold.  Trim any bushes or trees on the path to your home.  Use bright outdoor lighting.  Clear any walking paths of anything that might make someone trip, such as rocks or tools.  Regularly check to see if handrails are loose or broken. Make sure that both sides of any steps have handrails.  Any raised decks and porches should have guardrails on the edges.  Have any leaves, snow, or ice cleared regularly.  Use sand or salt on walking paths during winter.  Clean up any spills in your garage right away. This includes oil or grease spills. What can I do in the bathroom?  Use night lights.  Install grab bars by the toilet and in the tub and shower. Do not use towel bars as grab bars.  Use non-skid mats or decals in the tub or shower.  If you need to sit down in the shower, use a plastic, non-slip stool.  Keep the floor dry. Clean up any water that spills on the floor as soon as it happens.  Remove  soap buildup in the tub or shower regularly.  Attach bath mats securely with double-sided non-slip rug tape.  Do not have throw rugs and other things on the floor that can make you trip. What can I do in the bedroom?  Use night lights.  Make sure that you have a light by your bed that is easy to reach.  Do not use any sheets or blankets that are too big for your bed. They should not hang down onto the floor.  Have a firm chair that has side arms. You can use this for support while you get dressed.  Do not have throw rugs and other things on the floor that can make you trip. What can I do in the kitchen?  Clean up any spills right away.  Avoid walking on wet floors.  Keep items that you use a lot in easy-to-reach places.  If you need to reach something above you, use a strong step stool that has a grab bar.  Keep electrical cords out of the way.  Do not use floor polish or wax that makes floors slippery. If you must use wax, use non-skid floor wax.  Do not have throw rugs  and other things on the floor that can make you trip. What can I do with my stairs?  Do not leave any items on the stairs.  Make sure that there are handrails on both sides of the stairs and use them. Fix handrails that are broken or loose. Make sure that handrails are as long as the stairways.  Check any carpeting to make sure that it is firmly attached to the stairs. Fix any carpet that is loose or worn.  Avoid having throw rugs at the top or bottom of the stairs. If you do have throw rugs, attach them to the floor with carpet tape.  Make sure that you have a light switch at the top of the stairs and the bottom of the stairs. If you do not have them, ask someone to add them for you. What else can I do to help prevent falls?  Wear shoes that:  Do not have high heels.  Have rubber bottoms.  Are comfortable and fit you well.  Are closed at the toe. Do not wear sandals.  If you use a  stepladder:  Make sure that it is fully opened. Do not climb a closed stepladder.  Make sure that both sides of the stepladder are locked into place.  Ask someone to hold it for you, if possible.  Clearly mark and make sure that you can see:  Any grab bars or handrails.  First and last steps.  Where the edge of each step is.  Use tools that help you move around (mobility aids) if they are needed. These include:  Canes.  Walkers.  Scooters.  Crutches.  Turn on the lights when you go into a dark area. Replace any light bulbs as soon as they burn out.  Set up your furniture so you have a clear path. Avoid moving your furniture around.  If any of your floors are uneven, fix them.  If there are any pets around you, be aware of where they are.  Review your medicines with your doctor. Some medicines can make you feel dizzy. This can increase your chance of falling. Ask your doctor what other things that you can do to help prevent falls. This information is not intended to replace advice given to you by your health care provider. Make sure you discuss any questions you have with your health care provider. Document Released: 12/06/2008 Document Revised: 07/18/2015 Document Reviewed: 03/16/2014 Elsevier Interactive Patient Education  2017 [employer].

## 2020-01-26 NOTE — Progress Notes (Signed)
Established Patient Office Visit  Subjective:  Patient ID: Kevin Wise, male    DOB: 22-Dec-1935  Age: 84 y.o. MRN: [id number]  CC:  Chief Complaint  Patient presents with  . Follow-up    3 mth    HPI [name] presents for medical follow-up.  He actually had Medicare wellness visit this morning with our health coach.  He is here to discuss follow-up for several items.  Refer to most recent office visit from 11/24/2019 for details.  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  He had fairly extensive work-up and had ferritin of 54 and low serum iron of 48 with normal TIBC and low transferrin saturation of 13%.  Sed rate normal.  Vitamin D level is low at 26.  Autoimmune screens were negative.  Recommendation was to go ahead with further GI work-up with his iron deficiency.  He has seen his gastroenterologist in [facility] and is scheduled in a couple weeks for colonoscopy and upper endoscopy.  He denies any recent bloody stools.  Appetite is stable.  Weight is up a few pounds.  He had recent low vitamin D level and is taking over-the-counter vitamin D 1000 international units daily.  He is requesting repeat levels at this time.  He denies any dizziness.  No stool changes.  Diabetes been stable.  His fasting sugars are well controlled.  Remains on Metformin.  Past Medical History:  Diagnosis Date  . ANEMIA 01/10/2008  . ARTHRITIS 01/06/2007  . CAROTID ARTERY STENOSIS, RIGHT 01/10/2009  . Diabetes mellitus without complication (HCC)   . ERECTILE DYSFUNCTION 01/06/2007  . HYPERGLYCEMIA, BORDERLINE 12/02/2009  . HYPERLIPIDEMIA 01/06/2007  . HYPERTENSION, BENIGN ESSENTIAL 01/06/2007  . HYPOTHYROIDISM 01/10/2008  . LIVER FUNCTION TESTS, ABNORMAL, HX OF 12/02/2009  . Other symptoms involving cardiovascular system 01/10/2009  . Rosacea 01/06/2007  . UNS ADVRS EFF OTH RX MEDICINAL&BIOLOGICAL SBSTNC 01/10/2008  . VITAMIN D  DEFICIENCY 01/10/2008    Past Surgical History:  Procedure Laterality Date  . HERNIA REPAIR     bilateral inguinal   . TONSILLECTOMY      Family History  Problem Relation Age of Onset  . Hyperlipidemia Mother   . Diabetes Mother     Social History   Socioeconomic History  . Marital status: Married    Spouse name: Not on file  . Number of children: Not on file  . Years of education: Not on file  . Highest education level: Not on file  Occupational History  . Not on file  Tobacco Use  . Smoking status: Never Smoker  . Smokeless tobacco: Never Used  Vaping Use  . Vaping Use: Never used  Substance and Sexual Activity  . Alcohol use: Not on file  . Drug use: No  . Sexual activity: Not on file  Other Topics Concern  . Not on file  Social History Narrative  . Not on file   Social Determinants of Health   Financial Resource Strain: Low Risk   . Difficulty of Paying Living Expenses: Not hard at all  Food Insecurity: No Food Insecurity  . Worried About Programme researcher, broadcasting/film/video in the Last Year: Never true  . Ran Out of Food in the Last Year: Never true  Transportation Needs: No Transportation Needs  . Lack of Transportation (Medical): No  . Lack of Transportation (Non-Medical): No  Physical Activity: Inactive  . Days of Exercise per Week: 0  days  . Minutes of Exercise per Session: 0 min  Stress: No Stress Concern Present  . Feeling of Stress : Not at all  Social Connections: Moderately Integrated  . Frequency of Communication with Friends and Family: More than three times a week  . Frequency of Social Gatherings with Friends and Family: More than three times a week  . Attends Religious Services: More than 4 times per year  . Active Member of Clubs or Organizations: No  . Attends Banker Meetings: Never  . Marital Status: Married  Catering manager Violence: Not At Risk  . Fear of Current or Ex-Partner: No  . Emotionally Abused: No  . Physically Abused: No   . Sexually Abused: No    Outpatient Medications Prior to Visit  Medication Sig Dispense Refill  . atorvastatin (LIPITOR) 40 MG tablet Take 1 tablet (40 mg total) by mouth daily. 90 tablet 3  . B-D TB SYRINGE 1CC/27GX1/2" 27G X 1/2" 1 ML MISC USE AS DIRECTED WITH METHOTREXATE    . betamethasone dipropionate 0.05 % cream Apply 1 application topically 2 (two) times daily.    . Cholecalciferol (VITAMIN D3) 10 MCG (400 UNIT) tablet Take by mouth.    . clindamycin (CLINDAGEL) 1 % gel 1(ONE) APPLICATION(S) TOPICAL EVERY DAY    . diclofenac Sodium (VOLTAREN) 1 % GEL SMARTSIG:Gram(s) Topical Twice Daily PRN    . ferrous sulfate 325 (65 FE) MG EC tablet Take 325 mg by mouth 3 (three) times daily with meals.    . fluticasone (FLONASE) 50 MCG/ACT nasal spray INSERT 2 SPRAYS IN EACH NOSTRIL EVERY DAY 48 g 1  . folic acid (FOLVITE) 1 MG tablet Take 1 mg by mouth daily.    Marland Kitchen gabapentin (NEURONTIN) 300 MG capsule Take 1 capsule (300 mg total) by mouth at bedtime. (Patient not taking: Reported on 11/24/2019) 90 capsule 3  . glucose blood (ONETOUCH VERIO) test strip Use as instructed to check blood sugars 2 times daily. Dx Code E11.9 100 each 3  . hydrOXYzine (ATARAX/VISTARIL) 25 MG tablet Take 1 tablet (25 mg total) by mouth every 8 (eight) hours as needed. 90 tablet 3  . latanoprost (XALATAN) 0.005 % ophthalmic solution PLACE 1 DROP IN BOTH EYES AT BEDTIME  3  . lidocaine-hydrocortisone (ANAMANTEL HC) 3-0.5 % CREA Place 1 Applicatorful rectally 2 (two) times daily. 7 g 2  . Lidocaine-Hydrocortisone Ace 3-0.5 % CREA Apply topically.    Marland Kitchen losartan (COZAAR) 50 MG tablet Take 1 tablet (50 mg total) by mouth daily. 90 tablet 3  . metFORMIN (GLUCOPHAGE) 850 MG tablet TAKE 1 TABLET BY MOUTH TWICE A DAY WITH MEAL 180 tablet 2  . methotrexate 50 MG/2ML injection INJECT 0.8ML WEEKLY    . Methotrexate Sodium (METHOTREXATE, PF,) 50 MG/2ML injection 0.6ML ONCE A WEEK INJECTION 90 DAYS    . OneTouch Delica Lancets 33G  MISC Use as instructed to check blood sugars 2 times daily. Dx Code E11.9 200 each 3  . timolol (TIMOPTIC) 0.5 % ophthalmic solution INSTILL 1 DROP INTO BOTH EYES TWICE A DAY    . triamcinolone cream (KENALOG) 0.1 % APPLY 1 APPLICATION 2 TIMES DAILY 454 g 1  . Vitamin D, Ergocalciferol, (DRISDOL) 1.25 MG (50000 UNIT) CAPS capsule Take 1 capsule (50,000 Units total) by mouth every 7 (seven) days. 12 capsule 0   No facility-administered medications prior to visit.    No Known Allergies  ROS Review of Systems  Constitutional: Negative for fatigue and unexpected weight change.  Eyes: Negative for visual disturbance.  Respiratory: Negative for cough, chest tightness and shortness of breath.   Cardiovascular: Negative for chest pain, palpitations and leg swelling.  Endocrine: Negative for polydipsia and polyuria.  Neurological: Negative for dizziness, syncope, weakness, light-headedness and headaches.      Objective:    Physical Exam Constitutional:      Appearance: He is well-developed.  HENT:     Right Ear: External ear normal.     Left Ear: External ear normal.  Eyes:     Pupils: Pupils are equal, round, and reactive to light.  Neck:     Thyroid: No thyromegaly.  Cardiovascular:     Rate and Rhythm: Normal rate and regular rhythm.  Pulmonary:     Effort: Pulmonary effort is normal. No respiratory distress.     Breath sounds: Normal breath sounds. No wheezing or rales.  Musculoskeletal:     Cervical back: Neck supple.     Right lower leg: No edema.     Left lower leg: No edema.  Neurological:     Mental Status: He is alert and oriented to person, place, and time.     BP 114/68 Comment: vitals from visit wells coordinator  Pulse 68   Temp 97.8 F (36.6 C) (Oral)   Resp 16   Ht 5\' 7"  (1.702 m)   Wt 170 lb (77.1 kg)   SpO2 98%   BMI 26.63 kg/m  Wt Readings from Last 3 Encounters:  01/26/20 170 lb (77.1 kg)  01/26/20 170 lb (77.1 kg)  11/24/19 164 lb 14.4 oz (74.8  kg)     There are no preventive care reminders to display for this patient.  There are no preventive care reminders to display for this patient.  Lab Results  Component Value Date   TSH 0.74 04/21/2019   Lab Results  Component Value Date   WBC 5.2 09/12/2019   HGB 11.5 (A) 09/12/2019   HCT 33 (A) 09/12/2019   MCV 95.6 04/21/2019   PLT 316.0 04/21/2019   Lab Results  Component Value Date   NA 138 04/21/2019   K 4.1 04/21/2019   CO2 29 04/21/2019   GLUCOSE 92 04/21/2019   BUN 17 04/21/2019   CREATININE 1.10 04/21/2019   BILITOT 0.7 04/21/2019   ALKPHOS 66 04/21/2019   AST 20 04/21/2019   ALT 21 04/21/2019   PROT 6.9 04/21/2019   ALBUMIN 4.3 04/21/2019   CALCIUM 9.6 04/21/2019   GFR 77.68 04/21/2019   Lab Results  Component Value Date   CHOL 121 04/21/2019   Lab Results  Component Value Date   HDL 33.70 (L) 04/21/2019   Lab Results  Component Value Date   LDLCALC 63 04/21/2019   Lab Results  Component Value Date   TRIG 120.0 04/21/2019   Lab Results  Component Value Date   CHOLHDL 4 04/21/2019   Lab Results  Component Value Date   HGBA1C 6.0 (H) 10/17/2019      Assessment & Plan:   Problem List Items Addressed This Visit      Unprioritized   Vitamin D deficiency   Relevant Orders   VITAMIN D 25 Hydroxy (Vit-D Deficiency, Fractures)   Type 2 diabetes mellitus (HCC) - Primary   Relevant Orders   Hemoglobin A1c    Other Visit Diagnoses    Iron deficiency anemia, unspecified iron deficiency anemia type       Relevant Orders   CBC with Differential/Platelet   Iron and TIBC    -  Check lab work as above.  Of vitamin D still low will increase his amount of daily vitamin D.  -Follow-up with GI work-up to evaluate his iron deficiency.  Patient requesting repeat iron levels and CBC today.  -Schedule for physical for approximately 3 months  No orders of the defined types were placed in this encounter.   Follow-up: Return in about 3 months  (around 04/25/2020).    Evelena Peat, MD

## 2020-01-27 LAB — CBC WITH DIFFERENTIAL/PLATELET
Absolute Monocytes: 414 cells/uL (ref 200–950)
Basophils Absolute: 37 cells/uL (ref 0–200)
Basophils Relative: 0.5 %
Eosinophils Absolute: 207 cells/uL (ref 15–500)
Eosinophils Relative: 2.8 %
HCT: 35.7 % — ABNORMAL LOW (ref 38.5–50.0)
Hemoglobin: 11.8 g/dL — ABNORMAL LOW (ref 13.2–17.1)
Lymphs Abs: 2102 cells/uL (ref 850–3900)
MCH: 32.1 pg (ref 27.0–33.0)
MCHC: 33.1 g/dL (ref 32.0–36.0)
MCV: 97 fL (ref 80.0–100.0)
MPV: 8.6 fL (ref 7.5–12.5)
Monocytes Relative: 5.6 %
Neutro Abs: 4640 cells/uL (ref 1500–7800)
Neutrophils Relative %: 62.7 %
Platelets: 287 10*3/uL (ref 140–400)
RBC: 3.68 10*6/uL — ABNORMAL LOW (ref 4.20–5.80)
RDW: 13.6 % (ref 11.0–15.0)
Total Lymphocyte: 28.4 %
WBC: 7.4 10*3/uL (ref 3.8–10.8)

## 2020-01-27 LAB — IRON, TOTAL/TOTAL IRON BINDING CAP
%SAT: 23 % (calc) (ref 20–48)
Iron: 77 ug/dL (ref 50–180)
TIBC: 337 mcg/dL (calc) (ref 250–425)

## 2020-01-27 LAB — VITAMIN D 25 HYDROXY (VIT D DEFICIENCY, FRACTURES): Vit D, 25-Hydroxy: 35 ng/mL (ref 30–100)

## 2020-01-27 LAB — HEMOGLOBIN A1C
Hgb A1c MFr Bld: 6.3 % of total Hgb — ABNORMAL HIGH (ref <5.7)
Mean Plasma Glucose: 134 (calc)
eAG (mmol/L): 7.4 (calc)

## 2020-01-30 ENCOUNTER — Telehealth: Payer: Self-pay

## 2020-01-30 NOTE — Telephone Encounter (Signed)
-----   Message from Kristian Covey, MD sent at 01/29/2020  5:42 AM EST ----- A1C remains stable at 6.3%.  Hgb stable at 11.8.  iron studies now in normal range.  Vit D normal range.

## 2020-01-30 NOTE — Telephone Encounter (Signed)
Patient aware of results.

## 2020-02-02 ENCOUNTER — Other Ambulatory Visit: Payer: Medicare Other

## 2020-02-02 DIAGNOSIS — Z20822 Contact with and (suspected) exposure to covid-19: Secondary | ICD-10-CM

## 2020-02-03 LAB — NOVEL CORONAVIRUS, NAA: SARS-CoV-2, NAA: NOT DETECTED

## 2020-02-03 LAB — SARS-COV-2, NAA 2 DAY TAT

## 2020-02-09 ENCOUNTER — Telehealth: Payer: Self-pay | Admitting: Family Medicine

## 2020-02-09 NOTE — Telephone Encounter (Signed)
Patient informed of the message below.  Patient stated he wanted to let Dr [name] know he had a colonoscopy yesterday and everything is fine.  Message sent to PCP.

## 2020-02-09 NOTE — Telephone Encounter (Signed)
Pt returning call and want to know should he continue to take Iron and vit -D the patient want a call back.

## 2020-02-09 NOTE — Telephone Encounter (Signed)
He can leave off the iron but would continue with daily vitamin D

## 2020-03-27 ENCOUNTER — Telehealth: Payer: Self-pay | Admitting: Family Medicine

## 2020-03-27 NOTE — Progress Notes (Signed)
  Chronic Care Management   Note  03/27/2020 Name: DAMARIEN NYMAN MRN: [id number] DOB: 1935-06-10  [name] is a 85 y.o. year old male who is a primary care patient of Burchette, Elberta Fortis, MD. I reached out to [name] by phone today in response to a referral sent by Mr. Adelfa Koh PCP, Kristian Covey, MD.   Mr. Gruszka was given information about Chronic Care Management services today including:  1. CCM service includes personalized support from designated clinical staff supervised by his physician, including individualized plan of care and coordination with other care providers 2. 24/7 contact phone numbers for assistance for urgent and routine care needs. 3. Service will only be billed when office clinical staff spend 20 minutes or more in a month to coordinate care. 4. Only one practitioner may furnish and bill the service in a calendar month. 5. The patient may stop CCM services at any time (effective at the end of the month) by phone call to the office staff.   Patient agreed to services and verbal consent obtained.   Follow up plan:   Carley Perdue [employer] Scheduler

## 2020-03-27 NOTE — Progress Notes (Signed)
  Chronic Care Management   Outreach Note  03/27/2020 Name: Kevin Wise MRN: [id number] DOB: 1935-09-08  Referred by: Kristian Covey, MD Reason for referral : No chief complaint on file.   A second unsuccessful telephone outreach was attempted today. The patient was referred to pharmacist for assistance with care management and care coordination.  Follow Up Plan:   Carley Perdue [employer] Scheduler

## 2020-04-14 ENCOUNTER — Other Ambulatory Visit: Payer: Self-pay | Admitting: Family Medicine

## 2020-04-25 ENCOUNTER — Other Ambulatory Visit: Payer: Self-pay

## 2020-04-25 ENCOUNTER — Telehealth: Payer: Self-pay | Admitting: *Deleted

## 2020-04-25 ENCOUNTER — Telehealth: Payer: Self-pay | Admitting: Pharmacist

## 2020-04-25 DIAGNOSIS — F528 Other sexual dysfunction not due to a substance or known physiological condition: Secondary | ICD-10-CM

## 2020-04-25 DIAGNOSIS — E119 Type 2 diabetes mellitus without complications: Secondary | ICD-10-CM

## 2020-04-25 DIAGNOSIS — I10 Essential (primary) hypertension: Secondary | ICD-10-CM

## 2020-04-25 DIAGNOSIS — E785 Hyperlipidemia, unspecified: Secondary | ICD-10-CM

## 2020-04-25 DIAGNOSIS — M059 Rheumatoid arthritis with rheumatoid factor, unspecified: Secondary | ICD-10-CM

## 2020-04-25 NOTE — Chronic Care Management (AMB) (Signed)
Chronic Care Management Pharmacy Assistant   Name: Kevin Wise  MRN: [id number] DOB: [birth year]  Reason for Encounter: Initial Questions for Pharmacist visit on 04/26/2020  Patient Questions: 1. Have you seen any other providers since your last visit? No 2. Any changes in your medications or health? No 3. Any side effects from any medications? No 4. Do you have any symptoms or problems not managed by your medications? No 5. Any concerns about your health right now? No 6. Has your provider asked that you check blood pressure, blood sugar, or follow a special diet at home? No 7.  Do you get any type of exercise regularly?  . Walking weekly 8. Can you think of a goal you would like to reach for your health?  . Maintain health day 9. Do you have any problems getting your medications? No 10. Is there anything that you would like to discuss during the appointment? No  The patient was asked to please bring medications, blood pressure/ blood sugar log, and supplements to his appointment.  PCP : Kristian Covey, MD  Allergies:  No Known Allergies  Medications: Outpatient Encounter Medications as of 04/25/2020  Medication Sig Note  . atorvastatin (LIPITOR) 40 MG tablet Take 1 tablet (40 mg total) by mouth daily.   . B-D TB SYRINGE 1CC/27GX1/2" 27G X 1/2" 1 ML MISC USE AS DIRECTED WITH METHOTREXATE   . betamethasone dipropionate 0.05 % cream Apply 1 application topically 2 (two) times daily.   . Cholecalciferol (VITAMIN D3) 10 MCG (400 UNIT) tablet Take by mouth.   . clindamycin (CLINDAGEL) 1 % gel 1(ONE) APPLICATION(S) TOPICAL EVERY DAY   . diclofenac Sodium (VOLTAREN) 1 % GEL SMARTSIG:Gram(s) Topical Twice Daily PRN   . ferrous sulfate 325 (65 FE) MG EC tablet Take 325 mg by mouth 3 (three) times daily with meals.   . fluticasone (FLONASE) 50 MCG/ACT nasal spray INSERT 2 SPRAYS IN EACH NOSTRIL EVERY DAY   . folic acid (FOLVITE) 1 MG tablet Take 1 mg by mouth daily.   Marland Kitchen  gabapentin (NEURONTIN) 300 MG capsule Take 1 capsule (300 mg total) by mouth at bedtime. (Patient not taking: Reported on 11/24/2019)   . hydrOXYzine (ATARAX/VISTARIL) 25 MG tablet Take 1 tablet (25 mg total) by mouth every 8 (eight) hours as needed.   . latanoprost (XALATAN) 0.005 % ophthalmic solution PLACE 1 DROP IN BOTH EYES AT BEDTIME 11/12/2014: Received from: External Pharmacy  . lidocaine-hydrocortisone (ANAMANTEL HC) 3-0.5 % CREA Place 1 Applicatorful rectally 2 (two) times daily.   . Lidocaine-Hydrocortisone Ace 3-0.5 % CREA Apply topically.   Marland Kitchen losartan (COZAAR) 50 MG tablet Take 1 tablet (50 mg total) by mouth daily.   . metFORMIN (GLUCOPHAGE) 850 MG tablet TAKE 1 TABLET BY MOUTH TWICE A DAY WITH MEAL   . methotrexate 50 MG/2ML injection INJECT 0.8ML WEEKLY   . Methotrexate Sodium (METHOTREXATE, PF,) 50 MG/2ML injection 0.6ML ONCE A WEEK INJECTION 90 DAYS   . OneTouch Delica Lancets 33G MISC Use as instructed to check blood sugars 2 times daily. Dx Code E11.9   . ONETOUCH VERIO test strip USE AS INSTRUCTED TO CHECK BLOOD SUGARS 2 TIMES DAILY. DX CODE E11.9   . timolol (TIMOPTIC) 0.5 % ophthalmic solution INSTILL 1 DROP INTO BOTH EYES TWICE A DAY   . triamcinolone cream (KENALOG) 0.1 % APPLY 1 APPLICATION 2 TIMES DAILY   . Vitamin D, Ergocalciferol, (DRISDOL) 1.25 MG (50000 UNIT) CAPS capsule Take 1 capsule (50,000 Units total)  by mouth every 7 (seven) days.    No facility-administered encounter medications on file as of 04/25/2020.    Current Diagnosis: Patient Active Problem List   Diagnosis Date Noted  . Rheumatoid arthritis (HCC) 08/12/2018  . Right rotator cuff tear 03/31/2017  . Uncomplicated type 2 diabetes mellitus (HCC) 08/28/2013  . Type 2 diabetes mellitus (HCC) 08/07/2011  . LIVER FUNCTION TESTS, ABNORMAL, HX OF 12/02/2009  . Vitamin D deficiency 01/10/2008  . Hyperlipidemia 01/06/2007  . ERECTILE DYSFUNCTION 01/06/2007  . HYPERTENSION, BENIGN ESSENTIAL 01/06/2007  .  ARTHRITIS 01/06/2007    Goals Addressed   None     Follow-Up:  Pharmacist Review  Berenice Bouton, [facility] Clinical Pharmacy Assistant [phone]

## 2020-04-25 NOTE — Telephone Encounter (Signed)
-----   Message from Verner Chol, [facility] sent at 04/25/2020  8:13 AM EST ----- Regarding: CCM referral Hi,  Can you please put in a CCM referral for one of Dr. Lucie Leather patients, Mr. Kevin Wise?  Thank you, Maddie

## 2020-04-26 ENCOUNTER — Ambulatory Visit (INDEPENDENT_AMBULATORY_CARE_PROVIDER_SITE_OTHER): Payer: Medicare Other | Admitting: Family Medicine

## 2020-04-26 ENCOUNTER — Ambulatory Visit (INDEPENDENT_AMBULATORY_CARE_PROVIDER_SITE_OTHER): Payer: Medicare Other | Admitting: Pharmacist

## 2020-04-26 ENCOUNTER — Encounter: Payer: Self-pay | Admitting: Family Medicine

## 2020-04-26 ENCOUNTER — Telehealth: Payer: Self-pay

## 2020-04-26 VITALS — BP 116/68 | HR 63 | Ht 67.0 in | Wt 169.0 lb

## 2020-04-26 DIAGNOSIS — E119 Type 2 diabetes mellitus without complications: Secondary | ICD-10-CM

## 2020-04-26 DIAGNOSIS — K4090 Unilateral inguinal hernia, without obstruction or gangrene, not specified as recurrent: Secondary | ICD-10-CM | POA: Insufficient documentation

## 2020-04-26 DIAGNOSIS — E785 Hyperlipidemia, unspecified: Secondary | ICD-10-CM

## 2020-04-26 DIAGNOSIS — D509 Iron deficiency anemia, unspecified: Secondary | ICD-10-CM | POA: Diagnosis not present

## 2020-04-26 DIAGNOSIS — I10 Essential (primary) hypertension: Secondary | ICD-10-CM | POA: Diagnosis not present

## 2020-04-26 LAB — CBC WITH DIFFERENTIAL/PLATELET
Basophils Absolute: 0 10*3/uL (ref 0.0–0.1)
Basophils Relative: 0.7 % (ref 0.0–3.0)
Eosinophils Absolute: 0.2 10*3/uL (ref 0.0–0.7)
Eosinophils Relative: 3.8 % (ref 0.0–5.0)
HCT: 34.3 % — ABNORMAL LOW (ref 39.0–52.0)
Hemoglobin: 11.5 g/dL — ABNORMAL LOW (ref 13.0–17.0)
Lymphocytes Relative: 33.3 % (ref 12.0–46.0)
Lymphs Abs: 1.9 10*3/uL (ref 0.7–4.0)
MCHC: 33.5 g/dL (ref 30.0–36.0)
MCV: 96.4 fl (ref 78.0–100.0)
Monocytes Absolute: 0.3 10*3/uL (ref 0.1–1.0)
Monocytes Relative: 6 % (ref 3.0–12.0)
Neutro Abs: 3.3 10*3/uL (ref 1.4–7.7)
Neutrophils Relative %: 56.2 % (ref 43.0–77.0)
Platelets: 287 10*3/uL (ref 150.0–400.0)
RBC: 3.56 Mil/uL — ABNORMAL LOW (ref 4.22–5.81)
RDW: 13.9 % (ref 11.5–15.5)
WBC: 5.8 10*3/uL (ref 4.0–10.5)

## 2020-04-26 LAB — LIPID PANEL
Cholesterol: 106 mg/dL (ref 0–200)
HDL: 36.7 mg/dL — ABNORMAL LOW (ref >39.00)
LDL Cholesterol: 56 mg/dL (ref 0–99)
NonHDL: 69.15
Total CHOL/HDL Ratio: 3
Triglycerides: 66 mg/dL (ref 0.0–149.0)
VLDL: 13.2 mg/dL (ref 0.0–40.0)

## 2020-04-26 LAB — COMPREHENSIVE METABOLIC PANEL
ALT: 25 U/L (ref 0–53)
AST: 20 U/L (ref 0–37)
Albumin: 4.1 g/dL (ref 3.5–5.2)
Alkaline Phosphatase: 51 U/L (ref 39–117)
BUN: 15 mg/dL (ref 6–23)
CO2: 29 mEq/L (ref 19–32)
Calcium: 9.2 mg/dL (ref 8.4–10.5)
Chloride: 106 mEq/L (ref 96–112)
Creatinine, Ser: 1.12 mg/dL (ref 0.40–1.50)
GFR: 61.26 mL/min (ref >60.00)
Glucose, Bld: 97 mg/dL (ref 70–99)
Potassium: 4.1 mEq/L (ref 3.5–5.1)
Sodium: 142 mEq/L (ref 135–145)
Total Bilirubin: 0.6 mg/dL (ref 0.2–1.2)
Total Protein: 6.4 g/dL (ref 6.0–8.3)

## 2020-04-26 LAB — HEMOGLOBIN A1C: Hgb A1c MFr Bld: 6.7 % — ABNORMAL HIGH (ref 4.6–6.5)

## 2020-04-26 NOTE — Addendum Note (Signed)
Addended by: Leonette Nutting on: 04/26/2020 09:02 AM   Modules accepted: Orders

## 2020-04-26 NOTE — Progress Notes (Signed)
Established Patient Office Visit  Subjective:  Patient ID: Kevin Wise, male    DOB: [birth year]  Age: 85 y.o. MRN: [id number]  CC:  Chief Complaint  Patient presents with  . Annual Exam    HPI Kevin Wise presents for medical follow-up.  He has history of hypertension, type 2 diabetes, rheumatoid arthritis, vitamin D deficiency, hyperlipidemia, glaucoma.  He has had some chronic severe pruritus and had extensive work-up at [facility] with no clear etiology found.  He does have history of iron deficiency and had full upper and lower endoscopy in [facility] recently with no source found.  He had been taking some iron.  Denies any dizziness.  He remains on atorvastatin 40 mg daily for hyperlipidemia.  Takes losartan for hypertension.  No recent dizziness.  No recent chest pains.  Appetite and weight are stable.  He is getting regular eye exams.  Is due for tetanus booster.  Other immunizations up-to-date.  He is followed by rheumatology regarding his rheumatoid arthritis.  Diabetes has generally been well controlled with Metformin.  Past Medical History:  Diagnosis Date  . ANEMIA 01/10/2008  . ARTHRITIS 01/06/2007  . CAROTID ARTERY STENOSIS, RIGHT 01/10/2009  . Diabetes mellitus without complication (HCC)   . ERECTILE DYSFUNCTION 01/06/2007  . HYPERGLYCEMIA, BORDERLINE 12/02/2009  . HYPERLIPIDEMIA 01/06/2007  . HYPERTENSION, BENIGN ESSENTIAL 01/06/2007  . HYPOTHYROIDISM 01/10/2008  . LIVER FUNCTION TESTS, ABNORMAL, HX OF 12/02/2009  . Other symptoms involving cardiovascular system 01/10/2009  . Rosacea 01/06/2007  . UNS ADVRS EFF OTH RX MEDICINAL&BIOLOGICAL SBSTNC 01/10/2008  . VITAMIN D DEFICIENCY 01/10/2008    Past Surgical History:  Procedure Laterality Date  . HERNIA REPAIR     bilateral inguinal   . TONSILLECTOMY      Family History  Problem Relation Age of Onset  . Hyperlipidemia Mother   . Diabetes Mother     Social History   Socioeconomic History  . Marital  status: Married    Spouse name: Not on file  . Number of children: Not on file  . Years of education: Not on file  . Highest education level: Not on file  Occupational History  . Not on file  Tobacco Use  . Smoking status: Never Smoker  . Smokeless tobacco: Never Used  Vaping Use  . Vaping Use: Never used  Substance and Sexual Activity  . Alcohol use: Not on file  . Drug use: No  . Sexual activity: Not on file  Other Topics Concern  . Not on file  Social History Narrative  . Not on file   Social Determinants of Health   Financial Resource Strain: Low Risk   . Difficulty of Paying Living Expenses: Not hard at all  Food Insecurity: No Food Insecurity  . Worried About Programme researcher, broadcasting/film/video in the Last Year: Never true  . Ran Out of Food in the Last Year: Never true  Transportation Needs: No Transportation Needs  . Lack of Transportation (Medical): No  . Lack of Transportation (Non-Medical): No  Physical Activity: Inactive  . Days of Exercise per Week: 0 days  . Minutes of Exercise per Session: 0 min  Stress: No Stress Concern Present  . Feeling of Stress : Not at all  Social Connections: Moderately Integrated  . Frequency of Communication with Friends and Family: More than three times a week  . Frequency of Social Gatherings with Friends and Family: More than three times a week  . Attends Religious Services: More than  4 times per year  . Active Member of Clubs or Organizations: No  . Attends Banker Meetings: Never  . Marital Status: Married  Catering manager Violence: Not At Risk  . Fear of Current or Ex-Partner: No  . Emotionally Abused: No  . Physically Abused: No  . Sexually Abused: No    Outpatient Medications Prior to Visit  Medication Sig Dispense Refill  . atorvastatin (LIPITOR) 40 MG tablet Take 1 tablet (40 mg total) by mouth daily. 90 tablet 3  . B-D TB SYRINGE 1CC/27GX1/2" 27G X 1/2" 1 ML MISC USE AS DIRECTED WITH METHOTREXATE    .  betamethasone dipropionate 0.05 % cream Apply 1 application topically 2 (two) times daily.    . Cholecalciferol (VITAMIN D3) 10 MCG (400 UNIT) tablet Take by mouth.    . clindamycin (CLINDAGEL) 1 % gel 1(ONE) APPLICATION(S) TOPICAL EVERY DAY    . diclofenac Sodium (VOLTAREN) 1 % GEL SMARTSIG:Gram(s) Topical Twice Daily PRN    . ferrous sulfate 325 (65 FE) MG EC tablet Take 325 mg by mouth 3 (three) times daily with meals.    . fluticasone (FLONASE) 50 MCG/ACT nasal spray INSERT 2 SPRAYS IN EACH NOSTRIL EVERY DAY 48 g 1  . folic acid (FOLVITE) 1 MG tablet Take 1 mg by mouth daily.    . hydrOXYzine (ATARAX/VISTARIL) 25 MG tablet Take 1 tablet (25 mg total) by mouth every 8 (eight) hours as needed. 90 tablet 3  . latanoprost (XALATAN) 0.005 % ophthalmic solution PLACE 1 DROP IN BOTH EYES AT BEDTIME  3  . lidocaine-hydrocortisone (ANAMANTEL HC) 3-0.5 % CREA Place 1 Applicatorful rectally 2 (two) times daily. 7 g 2  . Lidocaine-Hydrocortisone Ace 3-0.5 % CREA Apply topically.    Marland Kitchen losartan (COZAAR) 50 MG tablet Take 1 tablet (50 mg total) by mouth daily. 90 tablet 3  . metFORMIN (GLUCOPHAGE) 850 MG tablet TAKE 1 TABLET BY MOUTH TWICE A DAY WITH MEAL 180 tablet 2  . methotrexate 50 MG/2ML injection INJECT 0.8ML WEEKLY    . Methotrexate Sodium (METHOTREXATE, PF,) 50 MG/2ML injection 0.6ML ONCE A WEEK INJECTION 90 DAYS    . OneTouch Delica Lancets 33G MISC Use as instructed to check blood sugars 2 times daily. Dx Code E11.9 200 each 3  . ONETOUCH VERIO test strip USE AS INSTRUCTED TO CHECK BLOOD SUGARS 2 TIMES DAILY. DX CODE E11.9 100 strip 3  . timolol (TIMOPTIC) 0.5 % ophthalmic solution INSTILL 1 DROP INTO BOTH EYES TWICE A DAY    . triamcinolone cream (KENALOG) 0.1 % APPLY 1 APPLICATION 2 TIMES DAILY 454 g 1  . Vitamin D, Ergocalciferol, (DRISDOL) 1.25 MG (50000 UNIT) CAPS capsule Take 1 capsule (50,000 Units total) by mouth every 7 (seven) days. 12 capsule 0  . gabapentin (NEURONTIN) 300 MG capsule  Take 1 capsule (300 mg total) by mouth at bedtime. 90 capsule 3   No facility-administered medications prior to visit.    No Known Allergies  ROS Review of Systems  Constitutional: Negative for fatigue.  Eyes: Negative for visual disturbance.  Respiratory: Negative for cough, chest tightness and shortness of breath.   Cardiovascular: Negative for chest pain, palpitations and leg swelling.  Endocrine: Negative for polydipsia and polyuria.  Neurological: Negative for dizziness, syncope, weakness, light-headedness and headaches.      Objective:    Physical Exam Constitutional:      Appearance: He is well-developed and well-nourished.  HENT:     Right Ear: External ear normal.  Left Ear: External ear normal.     Mouth/Throat:     Mouth: Oropharynx is clear and moist.  Eyes:     Pupils: Pupils are equal, round, and reactive to light.  Neck:     Thyroid: No thyromegaly.  Cardiovascular:     Rate and Rhythm: Normal rate and regular rhythm.  Pulmonary:     Effort: Pulmonary effort is normal. No respiratory distress.     Breath sounds: Normal breath sounds. No wheezing or rales.  Musculoskeletal:        General: No edema.     Cervical back: Neck supple.     Comments: He does have trace pitting edema in both legs.  Skin:    Comments: Feet reveal no skin lesions. Good distal foot pulses. Good capillary refill. No calluses. Normal sensation with monofilament testing   Neurological:     Mental Status: He is alert and oriented to person, place, and time.     BP 116/68   Pulse 63   Ht 5\' 7"  (1.702 m)   Wt 169 lb (76.7 kg)   SpO2 98%   BMI 26.47 kg/m  Wt Readings from Last 3 Encounters:  04/26/20 169 lb (76.7 kg)  01/26/20 170 lb (77.1 kg)  01/26/20 170 lb (77.1 kg)     Health Maintenance Due  Topic Date Due  . TETANUS/TDAP  04/17/2020  . FOOT EXAM  04/20/2020    There are no preventive care reminders to display for this patient.  Lab Results  Component  Value Date   TSH 0.74 04/21/2019   Lab Results  Component Value Date   WBC 7.4 01/26/2020   HGB 11.8 (L) 01/26/2020   HCT 35.7 (L) 01/26/2020   MCV 97.0 01/26/2020   PLT 287 01/26/2020   Lab Results  Component Value Date   NA 138 04/21/2019   K 4.1 04/21/2019   CO2 29 04/21/2019   GLUCOSE 92 04/21/2019   BUN 17 04/21/2019   CREATININE 1.10 04/21/2019   BILITOT 0.7 04/21/2019   ALKPHOS 66 04/21/2019   AST 20 04/21/2019   ALT 21 04/21/2019   PROT 6.9 04/21/2019   ALBUMIN 4.3 04/21/2019   CALCIUM 9.6 04/21/2019   GFR 77.68 04/21/2019   Lab Results  Component Value Date   CHOL 121 04/21/2019   Lab Results  Component Value Date   HDL 33.70 (L) 04/21/2019   Lab Results  Component Value Date   LDLCALC 63 04/21/2019   Lab Results  Component Value Date   TRIG 120.0 04/21/2019   Lab Results  Component Value Date   CHOLHDL 4 04/21/2019   Lab Results  Component Value Date   HGBA1C 6.3 (H) 01/26/2020      Assessment & Plan:   #1 type 2 diabetes.  History of good control.  Patient on Metformin. -Recheck renal function and A1c today -Continue yearly eye exams -Handout on diabetic foot care given  #2 hypertension stable and at goal -Continue losartan 50 mg daily  #3 dyslipidemia.  Patient on Lipitor -Check lipid and hepatic panel today  #4 history of iron deficiency anemia.  Recent upper and lower endoscopy unrevealing.  Question AVM bleed versus reduced absorption.  No history of gluten enteropathy -Check CBC today  No orders of the defined types were placed in this encounter.   Follow-up: Return in about 3 months (around 07/27/2020).    Evelena Peat, MD

## 2020-04-26 NOTE — Telephone Encounter (Signed)
-----   Message from Kristian Covey, MD sent at 04/26/2020  1:05 PM EST ----- Chemistries and lipids are stable.  Cholesterol mains well controlled.  A1c up just slightly at 6.7%.  His hemoglobin remains slightly low at 11.5 but is stable and has been in this range for quite some time.

## 2020-04-26 NOTE — Progress Notes (Unsigned)
Chronic Care Management Pharmacy Note  05/06/2020 Name:  Kevin Wise MRN:  [id number] DOB:  [birth year]  Subjective: Kevin Wise is an 85 y.o. year old male who is a primary patient of Burchette, Alinda Sierras, MD.  The CCM team was consulted for assistance with disease management and care coordination needs.    Engaged with patient face to face for initial visit in response to provider referral for pharmacy case management and/or care coordination services.   Consent to Services:  The patient was given the following information about Chronic Care Management services today, agreed to services, and gave verbal consent: 1. CCM service includes personalized support from designated clinical staff supervised by the primary care provider, including individualized plan of care and coordination with other care providers 2. 24/7 contact phone numbers for assistance for urgent and routine care needs. 3. Service will only be billed when office clinical staff spend 20 minutes or more in a month to coordinate care. 4. Only one practitioner may furnish and bill the service in a calendar month. 5.The patient may stop CCM services at any time (effective at the end of the month) by phone call to the office staff. 6. The patient will be responsible for cost sharing (co-pay) of up to 20% of the service fee (after annual deductible is met). Patient agreed to services and consent obtained.  Patient Care Team: Eulas Post, MD as PCP - General (Family Medicine) Viona Gilmore, [facility] as Pharmacist (Pharmacist)  Recent office visits: 01/26/20 Carolann Littler, MD: Patient presented for diabetes follow up. A1c stable at 6.3%.   01/26/20 Ofilia Neas, LPN: Patient presented for medicare annual wellness visit.   11/24/19 Carolann Littler, MD: Patient presented for chronic pruritis follow up.   Recent consult visits: 02/08/20 Colon Branch (gastro): Patient presented for iron deficiency anemia for  endoscopy.  12/26/19 Albert Rhoton (gastro): Patient presented for iron deficiency anemia evaluation.  11/03/19 Milagros Evener (dermatology): Patient presented for pruritis work up.  Hospital visits: None in previous 6 months  Objective:  Lab Results  Component Value Date   CREATININE 1.12 04/26/2020   BUN 15 04/26/2020   GFR 61.26 04/26/2020   GFRNONAA 80.38 01/23/2010   GFRAA 95 01/10/2008   NA 142 04/26/2020   K 4.1 04/26/2020   CALCIUM 9.2 04/26/2020   CO2 29 04/26/2020    Lab Results  Component Value Date/Time   HGBA1C 6.7 (H) 04/26/2020 08:51 AM   HGBA1C 6.3 (H) 01/26/2020 09:44 AM   GFR 61.26 04/26/2020 08:51 AM   GFR 77.68 04/21/2019 09:52 AM   MICROALBUR 6.2 (H) 03/30/2016 08:26 AM   MICROALBUR 5.5 (H) 03/11/2015 08:05 AM    Last diabetic Eye exam:  Lab Results  Component Value Date/Time   HMDIABEYEEXA No Retinopathy 03/28/2018 12:00 AM    Last diabetic Foot exam:  Lab Results  Component Value Date/Time   HMDIABFOOTEX normal 08/06/2014 12:00 AM     Lab Results  Component Value Date   CHOL 106 04/26/2020   HDL 36.70 (L) 04/26/2020   LDLCALC 56 04/26/2020   TRIG 66.0 04/26/2020   CHOLHDL 3 04/26/2020    Hepatic Function Latest Ref Rng & Units 04/26/2020 04/21/2019 04/18/2018  Total Protein 6.0 - 8.3 g/dL 6.4 6.9 6.5  Albumin 3.5 - 5.2 g/dL 4.1 4.3 3.9  AST 0 - 37 U/L 20 20 16   ALT 0 - 53 U/L 25 21 21   Alk Phosphatase 39 - 117 U/L 51 66 55  Total Bilirubin  0.2 - 1.2 mg/dL 0.6 0.7 0.4  Bilirubin, Direct 0.0 - 0.3 mg/dL - 0.2 0.1    Lab Results  Component Value Date/Time   TSH 0.74 04/21/2019 09:52 AM   TSH 0.61 04/18/2018 08:48 AM    CBC Latest Ref Rng & Units 04/26/2020 01/26/2020 09/12/2019  WBC 4.0 - 10.5 K/uL 5.8 7.4 5.2  Hemoglobin 13.0 - 17.0 g/dL 11.5(L) 11.8(L) 11.5(A)  Hematocrit 39.0 - 52.0 % 34.3(L) 35.7(L) 33(A)  Platelets 150.0 - 400.0 K/uL 287.0 287 -    Lab Results  Component Value Date/Time   VD25OH 35 01/26/2020 09:44 AM    VD25OH 67.51 07/21/2019 09:44 AM   VD25OH 16.77 (L) 04/21/2019 09:52 AM    Clinical ASCVD: No  The ASCVD Risk score Mikey Bussing DC Jr., et al., 2013) failed to calculate for the following reasons:   The 2013 ASCVD risk score is only valid for ages 76 to 20    Depression screen PHQ 2/9 01/26/2020 04/21/2019 04/18/2018  Decreased Interest 0 0 0  Down, Depressed, Hopeless 0 0 0  PHQ - 2 Score 0 0 0  Altered sleeping 0 0 0  Tired, decreased energy 0 0 0  Change in appetite 0 0 0  Feeling bad or failure about yourself  0 0 0  Trouble concentrating 0 0 0  Moving slowly or fidgety/restless 0 0 0  Suicidal thoughts 0 0 0  PHQ-9 Score 0 0 0  Difficult doing work/chores Not difficult at all Not difficult at all Not difficult at all      Social History   Tobacco Use  Smoking Status Never Smoker  Smokeless Tobacco Never Used   BP Readings from Last 3 Encounters:  04/26/20 116/68  01/26/20 114/68  01/26/20 114/68   Pulse Readings from Last 3 Encounters:  04/26/20 63  01/26/20 68  01/26/20 68   Wt Readings from Last 3 Encounters:  04/26/20 169 lb (76.7 kg)  01/26/20 170 lb (77.1 kg)  01/26/20 170 lb (77.1 kg)    Assessment/Interventions: Review of patient past medical history, allergies, medications, health status, including review of consultants reports, laboratory and other test data, was performed as part of comprehensive evaluation and provision of chronic care management services.   SDOH:  (Social Determinants of Health) assessments and interventions performed: Yes SDOH Interventions   Flowsheet Row Most Recent Value  SDOH Interventions   Financial Strain Interventions Intervention Not Indicated  Transportation Interventions Intervention Not Indicated     Patient lives with his wife and they are both retired. He spends his day watching the news in morning and then eats breakfast and runs some errands such as going to the doctor, drug store and grocery store. His wife has been in  and out of the hospital lately.  He has a son that lives in [place].  Patient's wife cooks on Sunday and they eat leftovers for 3-4 days for the week and they sometimes get takeout about 1-2 days a week. He tends to eat what he wants and most meals include vegetables and meat and he eats a variety of meat.   Patient walks every day around the neighborhood for about 30 minutes per day. He was going to fitness center prior to [place] and has been thinking about going back lately.   Patient sleeps well and denies and trouble falling asleep or staying asleep. He gets about 7-8 hours per night, and does not take naps or feel tired during the day.  Patient denies any problems  with his medications.   CCM Care Plan  No Known Allergies  Medications Reviewed Today    Reviewed by Viona Gilmore, [facility] (Pharmacist) on 04/26/20 at 1120  Med List Status: <None>  Medication Order Taking? Sig Documenting Provider Last Dose Status Informant  atorvastatin (LIPITOR) 40 MG tablet [id number] Yes Take 1 tablet (40 mg total) by mouth daily. Burchette, Alinda Sierras, MD Taking Active   B-D TB SYRINGE 1CC/27GX1/2" 27G X 1/2" 1 ML MISC [id number]  USE AS DIRECTED WITH METHOTREXATE [provider]  Active   betamethasone dipropionate 0.05 % cream [id number]  Apply 1 application topically 2 (two) times daily. [provider]  Active   Cholecalciferol (VITAMIN D3) 10 MCG (400 UNIT) tablet [id number] Yes Take 1,000 Units by mouth daily. [provider] Taking Active   clindamycin (CLINDAGEL) 1 % gel [id number] Yes Apply 1 application topically as needed. [provider] Taking Active   diclofenac Sodium (VOLTAREN) 1 % GEL [id number] Yes SMARTSIG:Gram(s) Topical Twice Daily PRN [provider] Taking Active   fluticasone (FLONASE) 50 MCG/ACT nasal spray [id number] Yes INSERT 2 SPRAYS IN EACH NOSTRIL EVERY DAY Burchette, Alinda Sierras, MD Taking Active   folic acid (FOLVITE) 1 MG tablet [id number]  Yes Take 1 mg by mouth daily. [provider] Taking Active   hydrOXYzine (ATARAX/VISTARIL) 25 MG tablet [id number] No Take 1 tablet (25 mg total) by mouth every 8 (eight) hours as needed.  Patient not taking: Reported on 04/26/2020   Eulas Post, MD Not Taking Active   latanoprost (XALATAN) 0.005 % ophthalmic solution [id number] Yes PLACE 1 DROP IN BOTH EYES AT BEDTIME [provider] Taking Active            Med Note Carmell Austria, CARMEN E   Mon Nov 12, 2014  8:39 AM) Received from: External Pharmacy  losartan (COZAAR) 50 MG tablet [id number] Yes Take 1 tablet (50 mg total) by mouth daily. Eulas Post, MD Taking Active   metFORMIN (GLUCOPHAGE) 850 MG tablet [id number] Yes TAKE 1 TABLET BY MOUTH TWICE A DAY WITH MEAL Burchette, Alinda Sierras, MD Taking Active   methotrexate 50 MG/2ML injection [id number] Yes INJECT 0.8ML WEEKLY [provider] Taking Active   OneTouch Delica Lancets 72C MISC [id number]  Use as instructed to check blood sugars 2 times daily. Dx Code E11.9 Eulas Post, MD  Active   [facility] VERIO test strip [id number]  USE AS INSTRUCTED TO CHECK BLOOD SUGARS 2 TIMES DAILY. DX CODE E11.9 Burchette, Alinda Sierras, MD  Active   timolol (TIMOPTIC) 0.5 % ophthalmic solution [id number] Yes Place 1 drop into both eyes 2 (two) times daily. [provider] Taking Active   triamcinolone cream (KENALOG) 0.1 % [id number] Yes APPLY 1 APPLICATION 2 TIMES DAILY Burchette, Alinda Sierras, MD Taking Active           Patient Active Problem List   Diagnosis Date Noted  . HTN (hypertension) 04/26/2020  . Right inguinal hernia 04/26/2020  . GERD (gastroesophageal reflux disease) 11/10/2019  . Glaucoma 11/10/2019  . Rheumatoid arthritis ([place]) 08/12/2018  . Right rotator cuff tear 03/31/2017  . Uncomplicated type 2 diabetes mellitus ([place]) 08/28/2013  . Type 2 diabetes mellitus ([place]) 08/07/2011  . LIVER FUNCTION TESTS, ABNORMAL, HX OF 12/02/2009  . Vitamin D deficiency  01/10/2008  . Hyperlipidemia 01/06/2007  . ERECTILE DYSFUNCTION 01/06/2007  . HYPERTENSION, BENIGN ESSENTIAL 01/06/2007  . ARTHRITIS 01/06/2007    Immunization History  Administered Date(s) Administered  . Fluad Quad(high Dose  65+) 10/27/2018, 12/22/2018  . Influenza Split 11/20/2010, 11/13/2011  . Influenza Whole 12/31/2006, 11/22/2008, 11/14/2009  . Influenza, High Dose Seasonal PF 11/23/2013, 11/12/2014, 12/09/2015, 12/11/2016, 11/19/2017  . Influenza,inj,Quad PF,6+ Mos 10/31/2012  . Influenza-Unspecified 11/23/2019  . Moderna SARS-COV2 Booster Vaccination 12/18/2019  . Moderna Sars-Covid-2 Vaccination 03/06/2019, 04/03/2019  . Pneumococcal Conjugate-13 05/01/2013  . Pneumococcal Polysaccharide-23 03/01/2003  . Tdap 04/17/2010  . Zoster 03/06/2011  . Zoster Recombinat (Shingrix) 09/11/2016, 11/30/2016    Conditions to be addressed/monitored:  Hypertension, Hyperlipidemia, Diabetes and vitamin D deficiency, RA, and pruritis  Care Plan : [place]  Updates made by Viona Gilmore, [place] since 05/06/2020 12:00 AM    Problem: Problem: Hypertension, Hyperlipidemia, Diabetes and vitamin D deficiency, RA, and pruritis     Long-Range Goal: Patient-Specific Goal   Start Date: 04/26/2020  Expected End Date: 04/26/2021  This Visit's Progress: On track  Priority: High  Note:   Current Barriers:  . Unable to independently monitor therapeutic efficacy  Pharmacist Clinical Goal(s):  . patient will achieve adherence to monitoring guidelines and medication adherence to achieve therapeutic efficacy through collaboration with PharmD and provider.   Interventions: . 1:1 collaboration with Eulas Post, MD regarding development and update of comprehensive plan of care as evidenced by provider attestation and co-signature . Inter-disciplinary care team collaboration (see longitudinal plan of care) . Comprehensive medication review performed; medication list updated in  electronic medical record  Hypertension (BP goal <140/90) -Controlled -Current treatment: . Losartan 50 mg 1 tablet daily -Medications previously tried: quinapril-HCTZ  -Current home readings: < 120, 110-120 range (checking once a week) -Current dietary habits: limits salt intake and recommended lite salt to further decrease -Current exercise habits: walking for about 30 minutes per day -Denies hypotensive/hypertensive symptoms -Educated on Exercise goal of 150 minutes per week; Importance of home blood pressure monitoring; -Counseled to monitor BP at home weekly, document, and provide log at future appointments -Counseled on diet and exercise extensively Recommended to continue current medication  Hyperlipidemia: (LDL goal < 100) -Controlled -Current treatment: . Atorvastatin 40 mg 1 tablet daily -Medications previously tried: none  -Current dietary patterns: did not dicuss -Current exercise habits: walking daily -Educated on Cholesterol goals;  Importance of limiting foods high in cholesterol; Exercise goal of 150 minutes per week; -Recommended to continue current medication  Diabetes (A1c goal <7%) -Controlled -Current medications: Marland Kitchen Metformin 850 mg 1 tablet twice daily -Medications previously tried: none  -Current home glucose readings (2-3 times a week) . fasting glucose: goal is to keep it < 120 . post prandial glucose: n/a -Denies hypoglycemic/hyperglycemic symptoms -Current meal patterns:  . breakfast: n/a  . lunch: n/a  . dinner: n/a . snacks: limiting ice cream . drinks: n/a -Current exercise: walking daily -Educated on Exercise goal of 150 minutes per week; Carbohydrate counting and/or plate method -Counseled to check feet daily and get yearly eye exams -Recommended to continue current medication  Vitamin D deficiency (Goal: 30-100) -Controlled -Current treatment  . Vitamin D 1000 units daily -Medications previously tried: none  -Recommended to  continue current medication  Rheumatoid arthritis (Goal: prevent flare ups and minimize symptoms) -Controlled -Current treatment  . Methotrexate 50 mg/89m inject 0.8 ml weekly  Folic acid 1 mg 1 tablet daily -Medications previously tried: none  -Recommended to continue current medication  Anemia (Goal: HgB > 11) -Uncontrolled -Current treatment  . No medications -Medications previously tried: ferrous sulfate (completed)  -Counseled on diet and exercise extensively Glaucoma (Goal: lower intraocular  pressure) -Controlled -Current treatment  . Latanoprost 1 drop in both eyes at bedtime . Timolol 0.5% 1 drop in both eyes twice daily -Medications previously tried: none  -Recommended to continue current medication  Topicals/pruritis (Goal: minimize symptoms) -Controlled -Current treatment  . Betamethasone cream 0.05% apply twice daily . Clindamycin 1% gel daily . Lidocaine-hydrocortisone 3-0.5% cream . Triamcinolone 0.1% cream apply twice daily -Medications previously tried: none  -Recommended to continue current medication Educated on possible reasons for a rash from other medications and can consider a trial without some of them to see if this is causing it but patient preferred to hold off for now   Health Maintenance -Vaccine gaps: tetanus (patient will call insurance about coverage) -Current therapy:  . Diclofenac gel 1% as needed . Flonase nasal spray as needed -Educated on Cost vs benefit of each product must be carefully weighed by individual consumer -Patient is satisfied with current therapy and denies issues -Recommended to continue current medication  Patient Goals/Self-Care Activities . patient will:  - take medications as prescribed check glucose 2-3 times a week at different times, document, and provide at future appointments check blood pressure weeklt, document, and provide at future appointments  Follow Up Plan: Telephone follow up appointment with care  management team member scheduled for: 4 months       Medication Assistance: None required.  Patient affirms current coverage meets needs.  Patient's preferred pharmacy is:  [id number] [place] [address]Phone: [phone]: [phone] Uses pill box? Yes Pt endorses 100% compliance  We discussed: Benefits of medication synchronization, packaging and delivery as well as enhanced pharmacist oversight with [employer]. Patient decided to: Continue current medication management strategy  Care Plan and Follow Up Patient Decision:  Patient agrees to Care Plan and Follow-up.  Plan: Telephone follow up appointment with care management team member scheduled for:  4 months  MJeni Salles PharmD BWest DentonPharmacist LSupremeat [phone]

## 2020-04-26 NOTE — Patient Instructions (Signed)
Diabetes Mellitus and Foot Care Foot care is an important part of your health, especially when you have diabetes. Diabetes may cause you to have problems because of poor blood flow (circulation) to your feet and legs, which can cause your skin to:  Become thinner and drier.  Break more easily.  Heal more slowly.  Peel and crack. You may also have nerve damage (neuropathy) in your legs and feet, causing decreased feeling in them. This means that you may not notice minor injuries to your feet that could lead to more serious problems. Noticing and addressing any potential problems early is the best way to prevent future foot problems. How to care for your feet Foot hygiene  Wash your feet daily with warm water and mild soap. Do not use hot water. Then, pat your feet and the areas between your toes until they are completely dry. Do not soak your feet as this can dry your skin.  Trim your toenails straight across. Do not dig under them or around the cuticle. File the edges of your nails with an emery board or nail file.  Apply a moisturizing lotion or petroleum jelly to the skin on your feet and to dry, brittle toenails. Use lotion that does not contain alcohol and is unscented. Do not apply lotion between your toes.   Shoes and socks  Wear clean socks or stockings every day. Make sure they are not too tight. Do not wear knee-high stockings since they may decrease blood flow to your legs.  Wear shoes that fit properly and have enough cushioning. Always look in your shoes before you put them on to be sure there are no objects inside.  To break in new shoes, wear them for just a few hours a day. This prevents injuries on your feet. Wounds, scrapes, corns, and calluses  Check your feet daily for blisters, cuts, bruises, sores, and redness. If you cannot see the bottom of your feet, use a mirror or ask someone for help.  Do not cut corns or calluses or try to remove them with medicine.  If you  find a minor scrape, cut, or break in the skin on your feet, keep it and the skin around it clean and dry. You may clean these areas with mild soap and water. Do not clean the area with peroxide, alcohol, or iodine.  If you have a wound, scrape, corn, or callus on your foot, look at it several times a day to make sure it is healing and not infected. Check for: ? Redness, swelling, or pain. ? Fluid or blood. ? Warmth. ? Pus or a bad smell.   General tips  Do not cross your legs. This may decrease blood flow to your feet.  Do not use heating pads or hot water bottles on your feet. They may burn your skin. If you have lost feeling in your feet or legs, you may not know this is happening until it is too late.  Protect your feet from hot and cold by wearing shoes, such as at the beach or on hot pavement.  Schedule a complete foot exam at least once a year (annually) or more often if you have foot problems. Report any cuts, sores, or bruises to your health care provider immediately. Where to find more information  American Diabetes Association: www.diabetes.org  Association of Diabetes Care & Education Specialists: www.diabeteseducator.org Contact a health care provider if:  You have a medical condition that increases your risk of infection and   you have any cuts, sores, or bruises on your feet.  You have an injury that is not healing.  You have redness on your legs or feet.  You feel burning or tingling in your legs or feet.  You have pain or cramps in your legs and feet.  Your legs or feet are numb.  Your feet always feel cold.  You have pain around any toenails. Get help right away if:  You have a wound, scrape, corn, or callus on your foot and: ? You have pain, swelling, or redness that gets worse. ? You have fluid or blood coming from the wound, scrape, corn, or callus. ? Your wound, scrape, corn, or callus feels warm to the touch. ? You have pus or a bad smell coming from  the wound, scrape, corn, or callus. ? You have a fever. ? You have a red line going up your leg. Summary  Check your feet every day for blisters, cuts, bruises, sores, and redness.  Apply a moisturizing lotion or petroleum jelly to the skin on your feet and to dry, brittle toenails.  Wear shoes that fit properly and have enough cushioning.  If you have foot problems, report any cuts, sores, or bruises to your health care provider immediately.  Schedule a complete foot exam at least once a year (annually) or more often if you have foot problems. This information is not intended to replace advice given to you by your health care provider. Make sure you discuss any questions you have with your health care provider. Document Revised: 08/31/2019 Document Reviewed: 08/31/2019 Elsevier Patient Education  2021 Elsevier Inc.  

## 2020-04-26 NOTE — Telephone Encounter (Signed)
Patient aware of results and recommendations. °

## 2020-04-29 ENCOUNTER — Telehealth: Payer: Self-pay | Admitting: Family Medicine

## 2020-04-29 NOTE — Telephone Encounter (Signed)
Iron rich diet is recommended.   His hgb is slightly low- but very stable.  Doubt this is causing any significant fatigue.

## 2020-04-29 NOTE — Telephone Encounter (Signed)
Spoke with the pt and he stated [facility] informed him of the results from the hemoglobin that was low.  Patient stated he has been "drowsy" for the past week, is concerned as often times he will sit down and drift off to sleep.  Patient questioned if there was anything he could do? Message sent to PCP.

## 2020-04-29 NOTE — Telephone Encounter (Signed)
Pt call and stated he want a call back because he have a question about his labs .

## 2020-04-30 NOTE — Telephone Encounter (Signed)
Patient informed of the message below.  Patient is aware information on iron rich foods will be mailed to his home address.

## 2020-05-01 DIAGNOSIS — H401132 Primary open-angle glaucoma, bilateral, moderate stage: Secondary | ICD-10-CM | POA: Diagnosis not present

## 2020-05-01 DIAGNOSIS — M0579 Rheumatoid arthritis with rheumatoid factor of multiple sites without organ or systems involvement: Secondary | ICD-10-CM | POA: Diagnosis not present

## 2020-05-01 DIAGNOSIS — E119 Type 2 diabetes mellitus without complications: Secondary | ICD-10-CM | POA: Diagnosis not present

## 2020-05-01 DIAGNOSIS — M2550 Pain in unspecified joint: Secondary | ICD-10-CM | POA: Diagnosis not present

## 2020-05-01 DIAGNOSIS — R5382 Chronic fatigue, unspecified: Secondary | ICD-10-CM | POA: Diagnosis not present

## 2020-05-06 NOTE — Patient Instructions (Addendum)
Hi Kevin Wise,  It was great to get to meet you in person! Below is a summary of some of the topics we discussed.  Please reach out to me if you have any questions or need anything before our follow up!  Best, Maddie  Gaylord Shih, PharmD, [facility] Clinical Pharmacist [facility] [phone]  Visit Information  Goals Addressed   None    Patient Care Plan: CCM Pharmacy Care Plan    Problem Identified: Problem: Hypertension, Hyperlipidemia, Diabetes and vitamin D deficiency, RA, and pruritis     Long-Range Goal: Patient-Specific Goal   Start Date: 04/26/2020  Expected End Date: 04/26/2021  This Visit's Progress: On track  Priority: High  Note:   Current Barriers:  . Unable to independently monitor therapeutic efficacy  Pharmacist Clinical Goal(s):  . patient will achieve adherence to monitoring guidelines and medication adherence to achieve therapeutic efficacy through collaboration with PharmD and provider.   Interventions: . 1:1 collaboration with Kristian Covey, MD regarding development and update of comprehensive plan of care as evidenced by provider attestation and co-signature . Inter-disciplinary care team collaboration (see longitudinal plan of care) . Comprehensive medication review performed; medication list updated in electronic medical record  Hypertension (BP goal <140/90) -Controlled -Current treatment: . Losartan 50 mg 1 tablet daily -Medications previously tried: quinapril-HCTZ  -Current home readings: < 120, 110-120 range (checking once a week) -Current dietary habits: limits salt intake and recommended lite salt to further decrease -Current exercise habits: walking for about 30 minutes per day -Denies hypotensive/hypertensive symptoms -Educated on Exercise goal of 150 minutes per week; Importance of home blood pressure monitoring; -Counseled to monitor BP at home weekly, document, and provide log at future appointments -Counseled on  diet and exercise extensively Recommended to continue current medication  Hyperlipidemia: (LDL goal < 100) -Controlled -Current treatment: . Atorvastatin 40 mg 1 tablet daily -Medications previously tried: none  -Current dietary patterns: did not dicuss -Current exercise habits: walking daily -Educated on Cholesterol goals;  Importance of limiting foods high in cholesterol; Exercise goal of 150 minutes per week; -Recommended to continue current medication  Diabetes (A1c goal <7%) -Controlled -Current medications: Marland Kitchen Metformin 850 mg 1 tablet twice daily -Medications previously tried: none  -Current home glucose readings (2-3 times a week) . fasting glucose: goal is to keep it < 120 . post prandial glucose: n/a -Denies hypoglycemic/hyperglycemic symptoms -Current meal patterns:  . breakfast: n/a  . lunch: n/a  . dinner: n/a . snacks: limiting ice cream . drinks: n/a -Current exercise: walking daily -Educated on Exercise goal of 150 minutes per week; Carbohydrate counting and/or plate method -Counseled to check feet daily and get yearly eye exams -Recommended to continue current medication  Vitamin D deficiency (Goal: 30-100) -Controlled -Current treatment  . Vitamin D 1000 units daily -Medications previously tried: none  -Recommended to continue current medication  Rheumatoid arthritis (Goal: prevent flare ups and minimize symptoms) -Controlled -Current treatment  . Methotrexate 50 mg/7mL inject 0.8 ml weekly  Folic acid 1 mg 1 tablet daily -Medications previously tried: none  -Recommended to continue current medication  Anemia (Goal: HgB > 11) -Uncontrolled -Current treatment  . No medications -Medications previously tried: ferrous sulfate (completed)  -Counseled on diet and exercise extensively Glaucoma (Goal: lower intraocular pressure) -Controlled -Current treatment  . Latanoprost 1 drop in both eyes at bedtime . Timolol 0.5% 1 drop in both eyes twice  daily -Medications previously tried: none  -Recommended to continue current medication  Topicals/pruritis (Goal: minimize  symptoms) -Controlled -Current treatment  . Betamethasone cream 0.05% apply twice daily . Clindamycin 1% gel daily . Lidocaine-hydrocortisone 3-0.5% cream . Triamcinolone 0.1% cream apply twice daily -Medications previously tried: none  -Recommended to continue current medication Educated on possible reasons for a rash from other medications and can consider a trial without some of them to see if this is causing it but patient preferred to hold off for now   Health Maintenance -Vaccine gaps: tetanus (patient will call insurance about coverage) -Current therapy:  . Diclofenac gel 1% as needed . Flonase nasal spray as needed -Educated on Cost vs benefit of each product must be carefully weighed by individual consumer -Patient is satisfied with current therapy and denies issues -Recommended to continue current medication  Patient Goals/Self-Care Activities . patient will:  - take medications as prescribed check glucose 2-3 times a week at different times, document, and provide at future appointments check blood pressure weeklt, document, and provide at future appointments  Follow Up Plan: Telephone follow up appointment with care management team member scheduled for: 4 months      Kevin Wise was given information about Chronic Care Management services today including:  1. CCM service includes personalized support from designated clinical staff supervised by his physician, including individualized plan of care and coordination with other care providers 2. 24/7 contact phone numbers for assistance for urgent and routine care needs. 3. Standard insurance, coinsurance, copays and deductibles apply for chronic care management only during months in which we provide at least 20 minutes of these services. Most insurances cover these services at 100%, however patients  may be responsible for any copay, coinsurance and/or deductible if applicable. This service may help you avoid the need for more expensive face-to-face services. 4. Only one practitioner may furnish and bill the service in a calendar month. 5. The patient may stop CCM services at any time (effective at the end of the month) by phone call to the office staff.  Patient agreed to services and verbal consent obtained.   The patient verbalized understanding of instructions, educational materials, and care plan provided today and agreed to receive a mailed copy of patient instructions, educational materials, and care plan.  Telephone follow up appointment with pharmacy team member scheduled for:  Verner Chol, [facility]  [email or web address]  DASH Eating Plan DASH stands for Dietary Approaches to Stop Hypertension. The DASH eating plan is a healthy eating plan that has been shown to:  Reduce high blood pressure (hypertension).  Reduce your risk for type 2 diabetes, heart disease, and stroke.  Help with weight loss. What are tips for following this plan? Reading food labels  Check food labels for the amount of salt (sodium) per serving. Choose foods with less than 5 percent of the Daily Value of sodium. Generally, foods with less than 300 milligrams (mg) of sodium per serving fit into this eating plan.  To find whole grains, look for the word "whole" as the first word in the ingredient list. Shopping  Buy products labeled as "low-sodium" or "no salt added."  Buy fresh foods. Avoid canned foods and pre-made or frozen meals. Cooking  Avoid adding salt when cooking. Use salt-free seasonings or herbs instead of table salt or sea salt. Check with your health care provider or pharmacist before using salt substitutes.  Do not fry foods. Cook foods using healthy methods such as baking, boiling, grilling, roasting, and broiling instead.  Cook with  heart-healthy oils, such as olive, canola, avocado, soybean, or  sunflower oil. Meal planning  Eat a balanced diet that includes: ? 4 or more servings of fruits and 4 or more servings of vegetables each day. Try to fill one-half of your plate with fruits and vegetables. ? 6-8 servings of whole grains each day. ? Less than 6 oz (170 g) of lean meat, poultry, or fish each day. A 3-oz (85-g) serving of meat is about the same size as a deck of cards. One egg equals 1 oz (28 g). ? 2-3 servings of low-fat dairy each day. One serving is 1 cup (237 mL). ? 1 serving of nuts, seeds, or beans 5 times each week. ? 2-3 servings of heart-healthy fats. Healthy fats called omega-3 fatty acids are found in foods such as walnuts, flaxseeds, fortified milks, and eggs. These fats are also found in cold-water fish, such as sardines, salmon, and mackerel.  Limit how much you eat of: ? Canned or prepackaged foods. ? Food that is high in trans fat, such as some fried foods. ? Food that is high in saturated fat, such as fatty meat. ? Desserts and other sweets, sugary drinks, and other foods with added sugar. ? Full-fat dairy products.  Do not salt foods before eating.  Do not eat more than 4 egg yolks a week.  Try to eat at least 2 vegetarian meals a week.  Eat more home-cooked food and less restaurant, buffet, and fast food.   Lifestyle  When eating at a restaurant, ask that your food be prepared with less salt or no salt, if possible.  If you drink alcohol: ? Limit how much you use to:  0-1 drink a day for women who are not pregnant.  0-2 drinks a day for men. ? Be aware of how much alcohol is in your drink. In the [place] one drink equals one 12 oz bottle of beer (355 mL), one 5 oz glass of wine (148 mL), or one 1 oz glass of hard liquor (44 mL). General information  Avoid eating more than 2,300 mg of salt a day. If you have hypertension, you may need to reduce your sodium intake to 1,500 mg a  day.  Work with your health care provider to maintain a healthy body weight or to lose weight. Ask what an ideal weight is for you.  Get at least 30 minutes of exercise that causes your heart to beat faster (aerobic exercise) most days of the week. Activities may include walking, swimming, or biking.  Work with your health care provider or dietitian to adjust your eating plan to your individual calorie needs. What foods should I eat? Fruits All fresh, dried, or frozen fruit. Canned fruit in natural juice (without added sugar). Vegetables Fresh or frozen vegetables (raw, steamed, roasted, or grilled). Low-sodium or reduced-sodium tomato and vegetable juice. Low-sodium or reduced-sodium tomato sauce and tomato paste. Low-sodium or reduced-sodium canned vegetables. Grains Whole-grain or whole-wheat bread. Whole-grain or whole-wheat pasta. Brown rice. Orpah Cobb. Bulgur. Whole-grain and low-sodium cereals. Pita bread. Low-fat, low-sodium crackers. Whole-wheat flour tortillas. Meats and other proteins Skinless chicken or [place]. Ground chicken or [place]. Pork with fat trimmed off. Fish and seafood. Egg whites. Dried beans, peas, or lentils. Unsalted nuts, nut butters, and seeds. Unsalted canned beans. Lean cuts of beef with fat trimmed off. Low-sodium, lean precooked or cured meat, such as sausages or meat loaves. Dairy Low-fat (1%) or fat-free (skim) milk. Reduced-fat, low-fat, or fat-free cheeses. Nonfat, low-sodium ricotta or cottage cheese. Low-fat or nonfat yogurt. Low-fat, low-sodium  cheese. Fats and oils Soft margarine without trans fats. Vegetable oil. Reduced-fat, low-fat, or light mayonnaise and salad dressings (reduced-sodium). Canola, safflower, olive, avocado, soybean, and sunflower oils. Avocado. Seasonings and condiments Herbs. Spices. Seasoning mixes without salt. Other foods Unsalted popcorn and pretzels. Fat-free sweets. The items listed above may not be a complete list of  foods and beverages you can eat. Contact a dietitian for more information. What foods should I avoid? Fruits Canned fruit in a light or heavy syrup. Fried fruit. Fruit in cream or butter sauce. Vegetables Creamed or fried vegetables. Vegetables in a cheese sauce. Regular canned vegetables (not low-sodium or reduced-sodium). Regular canned tomato sauce and paste (not low-sodium or reduced-sodium). Regular tomato and vegetable juice (not low-sodium or reduced-sodium). Rosita Fire. Olives. Grains Baked goods made with fat, such as croissants, muffins, or some breads. Dry pasta or rice meal packs. Meats and other proteins Fatty cuts of meat. Ribs. Fried meat. Tomasa Blase. Bologna, salami, and other precooked or cured meats, such as sausages or meat loaves. Fat from the back of a pig (fatback). Bratwurst. Salted nuts and seeds. Canned beans with added salt. Canned or smoked fish. Whole eggs or egg yolks. Chicken or [place] with skin. Dairy Whole or 2% milk, cream, and half-and-half. Whole or full-fat cream cheese. Whole-fat or sweetened yogurt. Full-fat cheese. Nondairy creamers. Whipped toppings. Processed cheese and cheese spreads. Fats and oils Butter. Stick margarine. Lard. Shortening. Ghee. Bacon fat. Tropical oils, such as coconut, palm kernel, or palm oil. Seasonings and condiments Onion salt, garlic salt, seasoned salt, table salt, and sea salt. Worcestershire sauce. Tartar sauce. Barbecue sauce. Teriyaki sauce. Soy sauce, including reduced-sodium. Steak sauce. Canned and packaged gravies. Fish sauce. Oyster sauce. Cocktail sauce. Store-bought horseradish. Ketchup. Mustard. Meat flavorings and tenderizers. Bouillon cubes. Hot sauces. Pre-made or packaged marinades. Pre-made or packaged taco seasonings. Relishes. Regular salad dressings. Other foods Salted popcorn and pretzels. The items listed above may not be a complete list of foods and beverages you should avoid. Contact a dietitian for more  information. Where to find more information  National Heart, Lung, and Blood Institute: PopSteam.is  American Heart Association: www.heart.org  Academy of Nutrition and Dietetics: www.eatright.org  National Kidney Foundation: www.kidney.org Summary  The DASH eating plan is a healthy eating plan that has been shown to reduce high blood pressure (hypertension). It may also reduce your risk for type 2 diabetes, heart disease, and stroke.  When on the DASH eating plan, aim to eat more fresh fruits and vegetables, whole grains, lean proteins, low-fat dairy, and heart-healthy fats.  With the DASH eating plan, you should limit salt (sodium) intake to 2,300 mg a day. If you have hypertension, you may need to reduce your sodium intake to 1,500 mg a day.  Work with your health care provider or dietitian to adjust your eating plan to your individual calorie needs. This information is not intended to replace advice given to you by your health care provider. Make sure you discuss any questions you have with your health care provider. Document Revised: 01/13/2019 Document Reviewed: 01/13/2019 Elsevier Patient Education  2021 [employer].

## 2020-06-07 ENCOUNTER — Other Ambulatory Visit: Payer: Self-pay | Admitting: Family Medicine

## 2020-06-30 ENCOUNTER — Other Ambulatory Visit: Payer: Self-pay | Admitting: Family Medicine

## 2020-07-12 ENCOUNTER — Telehealth: Payer: Self-pay | Admitting: Pharmacist

## 2020-07-12 NOTE — Chronic Care Management (AMB) (Signed)
Chronic Care Management Pharmacy Assistant   Name: Kevin Wise  MRN: [id number] DOB: Mar 09, 1935  Reason for Encounter: Disease State/General Assessment Call.    Conditions to be addressed/monitored: HTN, HLD and DMII  Recent office visits:  None.  Recent consult visits:  None.   Hospital visits:  None in previous 6 months  Medications: Outpatient Encounter Medications as of 07/12/2020  Medication Sig Note  . atorvastatin (LIPITOR) 40 MG tablet TAKE 1 TABLET BY MOUTH EVERY DAY   . B-D TB SYRINGE 1CC/27GX1/2" 27G X 1/2" 1 ML MISC USE AS DIRECTED WITH METHOTREXATE   . betamethasone dipropionate 0.05 % cream Apply 1 application topically 2 (two) times daily.   . Cholecalciferol (VITAMIN D3) 10 MCG (400 UNIT) tablet Take 1,000 Units by mouth daily.   . clindamycin (CLINDAGEL) 1 % gel Apply 1 application topically as needed.   . diclofenac Sodium (VOLTAREN) 1 % GEL SMARTSIG:Gram(s) Topical Twice Daily PRN   . fluticasone (FLONASE) 50 MCG/ACT nasal spray INSERT 2 SPRAYS IN EACH NOSTRIL EVERY DAY   . folic acid (FOLVITE) 1 MG tablet Take 1 mg by mouth daily.   . hydrOXYzine (ATARAX/VISTARIL) 25 MG tablet Take 1 tablet (25 mg total) by mouth every 8 (eight) hours as needed. (Patient not taking: Reported on 04/26/2020)   . latanoprost (XALATAN) 0.005 % ophthalmic solution PLACE 1 DROP IN BOTH EYES AT BEDTIME 11/12/2014: Received from: External Pharmacy  . losartan (COZAAR) 50 MG tablet Take 1 tablet (50 mg total) by mouth daily.   . metFORMIN (GLUCOPHAGE) 850 MG tablet TAKE 1 TABLET BY MOUTH TWICE A DAY W/ MEAL   . methotrexate 50 MG/2ML injection INJECT 0.8ML WEEKLY   . OneTouch Delica Lancets 33G MISC Use as instructed to check blood sugars 2 times daily. Dx Code E11.9   . ONETOUCH VERIO test strip USE AS INSTRUCTED TO CHECK BLOOD SUGARS 2 TIMES DAILY. DX CODE E11.9   . timolol (TIMOPTIC) 0.5 % ophthalmic solution Place 1 drop into both eyes 2 (two) times daily.   Marland Kitchen triamcinolone  cream (KENALOG) 0.1 % APPLY 1 APPLICATION 2 TIMES DAILY    No facility-administered encounter medications on file as of 07/12/2020.    Reviewed chart prior to disease state call. Spoke with patient regarding BP  Recent Office Vitals: BP Readings from Last 3 Encounters:  04/26/20 116/68  01/26/20 114/68  01/26/20 114/68   Pulse Readings from Last 3 Encounters:  04/26/20 63  01/26/20 68  01/26/20 68    Wt Readings from Last 3 Encounters:  04/26/20 169 lb (76.7 kg)  01/26/20 170 lb (77.1 kg)  01/26/20 170 lb (77.1 kg)     Kidney Function Lab Results  Component Value Date/Time   CREATININE 1.12 04/26/2020 08:51 AM   CREATININE 1.10 04/21/2019 09:52 AM   GFR 61.26 04/26/2020 08:51 AM   GFRNONAA 80.38 01/23/2010 08:18 AM   GFRAA 95 01/10/2008 12:00 AM    BMP Latest Ref Rng & Units 04/26/2020 04/21/2019 04/18/2018  Glucose 70 - 99 mg/dL 97 92 433(I)  BUN 6 - 23 mg/dL 15 17 22   Creatinine 0.40 - 1.50 mg/dL 9.51 8.84  Sodium 135 - 145 mEq/L 142 138 140  Potassium 3.5 - 5.1 mEq/L 4.1 4.1 4.3  Chloride 96 - 112 mEq/L 106 101 101  CO2 19 - 32 mEq/L 29 29 31   Calcium 8.4 - 10.5 mg/dL 9.2 9.6 9.6    . Current antihypertensive regimen:   Losartan 50 mg 1 tablet  daily . How often are you checking your Blood Pressure? Not checking currently.  . Current home BP readings: none to report.  . What recent interventions/DTPs have been made by any provider to improve Blood Pressure control since last CPP Visit: None.  . Any recent hospitalizations or ED visits since last visit with CPP? No . What diet changes have been made to improve Blood Pressure Control?  o Patient states he is eating less salt and he tends to have cereal or dry toast with peanut butter for breakfast. Patient states his lunch varies sometimes he will have a salad or soup. Patient states his wife cooks dinner and it usually has a fresh vegetable. Patient states he does not eat a lot of red meat.  . What exercise is  being done to improve your Blood Pressure Control?  o Patient states he walks some and he is able to do things around home. Patient stated he gets out of the house everyday.   Adherence Review: Is the patient currently on ACE/ARB medication? Yes Does the patient have >5 day gap between last estimated fill dates? No   Recent Relevant Labs: Lab Results  Component Value Date/Time   HGBA1C 6.7 (H) 04/26/2020 08:51 AM   HGBA1C 6.3 (H) 01/26/2020 09:44 AM   MICROALBUR 6.2 (H) 03/30/2016 08:26 AM   MICROALBUR 5.5 (H) 03/11/2015 08:05 AM    Kidney Function Lab Results  Component Value Date/Time   CREATININE 1.12 04/26/2020 08:51 AM   CREATININE 1.10 04/21/2019 09:52 AM   GFR 61.26 04/26/2020 08:51 AM   GFRNONAA 80.38 01/23/2010 08:18 AM   GFRAA 95 01/10/2008 12:00 AM    . Current antihyperglycemic regimen:   Metformin 850 mg 1 tablet twice daily . What recent interventions/DTPs have been made to improve glycemic control:  o None.  . Have there been any recent hospitalizations or ED visits since last visit with CPP? No . Patient denies hypoglycemic symptoms. . Patient denies hyperglycemic symptoms. . How often are you checking your blood sugar? Patient states he checks his blood sugar often.  . What are your blood sugars ranging? Patient states he checks his blood sugar often not necessarily at specific times. He states he never runs over 120.  o Fasting: n/a o Before meals: n/a o After meals: n/a o Bedtime: n/a . During the week, how often does your blood glucose drop below 70? Never . Are you checking your feet daily/regularly? Yes.  Adherence Review: Is the patient currently on a STATIN medication? Yes Is the patient currently on ACE/ARB medication? Yes Does the patient have >5 day gap between last estimated fill dates? No  Notes:  I spoke with patient and went over all medications as listed. Patient reports he is taking all medications and no issues with them at this time.  Patient reported he is not currently checking his blood pressure. Patient did say he checks his blood sugar regularly but not at a specific time every day. Patient states his sugar has not went above 120. Patient said he has diabetes a long time and knows what to do. I re encouraged patient to start checking his blood pressure at least once a week and write down the numbers. Patient was agreeable and verbalzied understanding. Patient thanked me for my call.  Star Rating Drugs:   Losartan 50 mg - last filled on 06/24/20 [facility] at [facility]   Atorvastatin 40 mg - last filled on 07/02/20 [facility] at [facility]   Metformin 850 mg -  last filled on 06/16/20 [facility] at [facility]  Joycelyn Das CMA  Clinical Pharmacist Assistant [phone]

## 2020-07-26 ENCOUNTER — Ambulatory Visit (INDEPENDENT_AMBULATORY_CARE_PROVIDER_SITE_OTHER): Payer: Medicare Other | Admitting: Family Medicine

## 2020-07-26 ENCOUNTER — Encounter: Payer: Self-pay | Admitting: Family Medicine

## 2020-07-26 ENCOUNTER — Other Ambulatory Visit: Payer: Self-pay

## 2020-07-26 VITALS — BP 124/60 | HR 74 | Temp 98.0°F | Wt 166.6 lb

## 2020-07-26 DIAGNOSIS — I10 Essential (primary) hypertension: Secondary | ICD-10-CM

## 2020-07-26 DIAGNOSIS — E119 Type 2 diabetes mellitus without complications: Secondary | ICD-10-CM | POA: Diagnosis not present

## 2020-07-26 DIAGNOSIS — E785 Hyperlipidemia, unspecified: Secondary | ICD-10-CM

## 2020-07-26 LAB — POCT GLYCOSYLATED HEMOGLOBIN (HGB A1C): Hemoglobin A1C: 6 % — AB (ref 4.0–5.6)

## 2020-07-26 NOTE — Progress Notes (Signed)
Established Patient Office Visit  Subjective:  Patient ID: Kevin Wise, male    DOB: 15-Dec-1935  Age: 85 y.o. MRN: [id number]  CC:  Chief Complaint  Patient presents with  . Follow-up    diabetes    HPI Kevin Wise presents for medical follow-up.  He has type 2 diabetes, hypertension, hyperlipidemia.  Also history of rheumatoid arthritis.  Followed by rheumatology and is on low-dose methotrexate.  Generally feels well.  He states he is tighten up his diet since last visit and A1c does reflect this with improvement to 6.0%.  Remains on metformin.  He had labs including chemistries and lipid panel back in March and those were reviewed.  Lipids well controlled with LDL cholesterol 56.  Remains on high-dose atorvastatin with no significant myalgias.  Compliant with all medications.  No recent falls.  Past Medical History:  Diagnosis Date  . ANEMIA 01/10/2008  . ARTHRITIS 01/06/2007  . CAROTID ARTERY STENOSIS, RIGHT 01/10/2009  . Diabetes mellitus without complication (HCC)   . ERECTILE DYSFUNCTION 01/06/2007  . HYPERGLYCEMIA, BORDERLINE 12/02/2009  . HYPERLIPIDEMIA 01/06/2007  . HYPERTENSION, BENIGN ESSENTIAL 01/06/2007  . HYPOTHYROIDISM 01/10/2008  . LIVER FUNCTION TESTS, ABNORMAL, HX OF 12/02/2009  . Other symptoms involving cardiovascular system 01/10/2009  . Rosacea 01/06/2007  . UNS ADVRS EFF OTH RX MEDICINAL&BIOLOGICAL SBSTNC 01/10/2008  . VITAMIN D DEFICIENCY 01/10/2008    Past Surgical History:  Procedure Laterality Date  . HERNIA REPAIR     bilateral inguinal   . TONSILLECTOMY      Family History  Problem Relation Age of Onset  . Hyperlipidemia Mother   . Diabetes Mother     Social History   Socioeconomic History  . Marital status: Married    Spouse name: Not on file  . Number of children: Not on file  . Years of education: Not on file  . Highest education level: Not on file  Occupational History  . Not on file  Tobacco Use  . Smoking status:  Never Smoker  . Smokeless tobacco: Never Used  Vaping Use  . Vaping Use: Never used  Substance and Sexual Activity  . Alcohol use: Not on file  . Drug use: No  . Sexual activity: Not on file  Other Topics Concern  . Not on file  Social History Narrative  . Not on file   Social Determinants of Health   Financial Resource Strain: Low Risk   . Difficulty of Paying Living Expenses: Not hard at all  Food Insecurity: No Food Insecurity  . Worried About Programme researcher, broadcasting/film/video in the Last Year: Never true  . Ran Out of Food in the Last Year: Never true  Transportation Needs: No Transportation Needs  . Lack of Transportation (Medical): No  . Lack of Transportation (Non-Medical): No  Physical Activity: Inactive  . Days of Exercise per Week: 0 days  . Minutes of Exercise per Session: 0 min  Stress: No Stress Concern Present  . Feeling of Stress : Not at all  Social Connections: Moderately Integrated  . Frequency of Communication with Friends and Family: More than three times a week  . Frequency of Social Gatherings with Friends and Family: More than three times a week  . Attends Religious Services: More than 4 times per year  . Active Member of Clubs or Organizations: No  . Attends Banker Meetings: Never  . Marital Status: Married  Catering manager Violence: Not At Risk  . Fear of Current  or Ex-Partner: No  . Emotionally Abused: No  . Physically Abused: No  . Sexually Abused: No    Outpatient Medications Prior to Visit  Medication Sig Dispense Refill  . atorvastatin (LIPITOR) 40 MG tablet TAKE 1 TABLET BY MOUTH EVERY DAY 90 tablet 3  . B-D TB SYRINGE 1CC/27GX1/2" 27G X 1/2" 1 ML MISC USE AS DIRECTED WITH METHOTREXATE    . betamethasone dipropionate 0.05 % cream Apply 1 application topically 2 (two) times daily.    . Cholecalciferol (VITAMIN D3) 10 MCG (400 UNIT) tablet Take 1,000 Units by mouth daily.    . clindamycin (CLINDAGEL) 1 % gel Apply 1 application topically  as needed.    . diclofenac Sodium (VOLTAREN) 1 % GEL SMARTSIG:Gram(s) Topical Twice Daily PRN    . fluticasone (FLONASE) 50 MCG/ACT nasal spray INSERT 2 SPRAYS IN EACH NOSTRIL EVERY DAY 48 g 1  . folic acid (FOLVITE) 1 MG tablet Take 1 mg by mouth daily.    . hydrOXYzine (ATARAX/VISTARIL) 25 MG tablet Take 1 tablet (25 mg total) by mouth every 8 (eight) hours as needed. 90 tablet 3  . latanoprost (XALATAN) 0.005 % ophthalmic solution PLACE 1 DROP IN BOTH EYES AT BEDTIME  3  . losartan (COZAAR) 50 MG tablet Take 1 tablet (50 mg total) by mouth daily. 90 tablet 3  . metFORMIN (GLUCOPHAGE) 850 MG tablet TAKE 1 TABLET BY MOUTH TWICE A DAY W/ MEAL 180 tablet 2  . methotrexate 50 MG/2ML injection INJECT 0.8ML WEEKLY    . OneTouch Delica Lancets 33G MISC Use as instructed to check blood sugars 2 times daily. Dx Code E11.9 200 each 3  . ONETOUCH VERIO test strip USE AS INSTRUCTED TO CHECK BLOOD SUGARS 2 TIMES DAILY. DX CODE E11.9 100 strip 3  . timolol (TIMOPTIC) 0.5 % ophthalmic solution Place 1 drop into both eyes 2 (two) times daily.    Marland Kitchen triamcinolone cream (KENALOG) 0.1 % APPLY 1 APPLICATION 2 TIMES DAILY 454 g 1   No facility-administered medications prior to visit.    No Known Allergies  ROS Review of Systems  Constitutional: Negative for fatigue and unexpected weight change.  Eyes: Negative for visual disturbance.  Respiratory: Negative for cough, chest tightness and shortness of breath.   Cardiovascular: Negative for chest pain, palpitations and leg swelling.  Neurological: Negative for dizziness, syncope, weakness, light-headedness and headaches.      Objective:    Physical Exam Constitutional:      Appearance: He is well-developed.  HENT:     Right Ear: External ear normal.     Left Ear: External ear normal.  Eyes:     Pupils: Pupils are equal, round, and reactive to light.  Neck:     Thyroid: No thyromegaly.  Cardiovascular:     Rate and Rhythm: Normal rate and regular  rhythm.  Pulmonary:     Effort: Pulmonary effort is normal. No respiratory distress.     Breath sounds: Normal breath sounds. No wheezing or rales.  Musculoskeletal:     Cervical back: Neck supple.  Skin:    Comments: Feet reveal no skin lesions. Good distal foot pulses. Good capillary refill. No calluses. Normal sensation with monofilament testing   Neurological:     Mental Status: He is alert and oriented to person, place, and time.     BP 124/60 (BP Location: Left Arm, Patient Position: Sitting, Cuff Size: Normal)   Pulse 74   Temp 98 F (36.7 C) (Oral)   Wt 166  lb 9.6 oz (75.6 kg)   SpO2 99%   BMI 26.09 kg/m  Wt Readings from Last 3 Encounters:  07/26/20 166 lb 9.6 oz (75.6 kg)  04/26/20 169 lb (76.7 kg)  01/26/20 170 lb (77.1 kg)     Health Maintenance Due  Topic Date Due  . TETANUS/TDAP  04/17/2020    There are no preventive care reminders to display for this patient.  Lab Results  Component Value Date   TSH 0.74 04/21/2019   Lab Results  Component Value Date   WBC 5.8 04/26/2020   HGB 11.5 (L) 04/26/2020   HCT 34.3 (L) 04/26/2020   MCV 96.4 04/26/2020   PLT 287.0 04/26/2020   Lab Results  Component Value Date   NA 142 04/26/2020   K 4.1 04/26/2020   CO2 29 04/26/2020   GLUCOSE 97 04/26/2020   BUN 15 04/26/2020   CREATININE 1.12 04/26/2020   BILITOT 0.6 04/26/2020   ALKPHOS 51 04/26/2020   AST 20 04/26/2020   ALT 25 04/26/2020   PROT 6.4 04/26/2020   ALBUMIN 4.1 04/26/2020   CALCIUM 9.2 04/26/2020   GFR 61.26 04/26/2020   Lab Results  Component Value Date   CHOL 106 04/26/2020   Lab Results  Component Value Date   HDL 36.70 (L) 04/26/2020   Lab Results  Component Value Date   LDLCALC 56 04/26/2020   Lab Results  Component Value Date   TRIG 66.0 04/26/2020   Lab Results  Component Value Date   CHOLHDL 3 04/26/2020   Lab Results  Component Value Date   HGBA1C 6.0 (A) 07/26/2020      Assessment & Plan:   #1 type 2  diabetes well controlled with A1c 6.0%  -Continue metformin at current dosage -Continue with annual eye exam  #2 hypertension stable and well-controlled   -Continue losartan 50 mg daily  #3 dyslipidemia treated with Lipitor.  Recent LDL cholesterol at goal.  -Continue atorvastatin 40 mg daily  Recommend 9-month follow-up.  He has scheduled physical for December of this year.  No orders of the defined types were placed in this encounter.   Follow-up: Return in about 4 months (around 11/25/2020).    Evelena Peat, MD

## 2020-07-26 NOTE — Patient Instructions (Addendum)
A1C was 6.0.   Keep up the good work!!

## 2020-07-31 DIAGNOSIS — M0579 Rheumatoid arthritis with rheumatoid factor of multiple sites without organ or systems involvement: Secondary | ICD-10-CM | POA: Diagnosis not present

## 2020-08-22 ENCOUNTER — Telehealth: Payer: Self-pay | Admitting: Pharmacist

## 2020-08-22 NOTE — Chronic Care Management (AMB) (Signed)
    Chronic Care Management Pharmacy Assistant   Name: Kevin Wise  MRN: [id number] DOB: 1935-06-23  08/22/20- Called patient to remind of appointment with Gaylord Shih) on (08/23/20 at 10am by phone)   No answer, left message of appointment date, time and type of appointment (either telephone or in person). Left message to have all medications, supplements, blood pressure and/or blood sugar logs available during appointment and to return call if need to reschedule.  Star Rating Drug:  Atorvastatin 40mg  - last filled on 07/02/20 [facility] at [facility] Losartan 50mg  - last filled on 06/24/20 [facility] at [facility] Metformin 850mg  - last filled on 06/16/20 90DS [facility]  Any gaps in medications fill history? No7/2/22 CMA  Clinical Pharmacist Assistant [phone]

## 2020-08-23 ENCOUNTER — Ambulatory Visit (INDEPENDENT_AMBULATORY_CARE_PROVIDER_SITE_OTHER): Payer: Medicare Other | Admitting: Pharmacist

## 2020-08-23 DIAGNOSIS — I10 Essential (primary) hypertension: Secondary | ICD-10-CM

## 2020-08-23 DIAGNOSIS — E119 Type 2 diabetes mellitus without complications: Secondary | ICD-10-CM | POA: Diagnosis not present

## 2020-08-23 NOTE — Progress Notes (Signed)
Chronic Care Management Pharmacy Note  08/23/2020 Name:  Kevin Wise MRN:  [id number] DOB:  1935-09-26  Summary: A1c at goal < 7% LDL at goal < 100 BP at goal < 140/80 per office readings   Recommendations/Changes made from today's visit: -Recommended for patient to monitor blood pressure at home while taking a blood pressure medication    Plan: Follow up in 6 months  Subjective: Kevin Wise is an 85 y.o. year old male who is a primary patient of Burchette, Alinda Sierras, MD.  The CCM team was consulted for assistance with disease management and care coordination needs.    Engaged with patient by telephone for follow up visit in response to provider referral for pharmacy case management and/or care coordination services.   Consent to Services:  The patient was given information about Chronic Care Management services, agreed to services, and gave verbal consent prior to initiation of services.  Please see initial visit note for detailed documentation.   Patient Care Team: Eulas Post, MD as PCP - General (Family Medicine) Viona Gilmore, [facility] as Pharmacist (Pharmacist)  Recent office visits: 07/26/20 Carolann Littler, MD: Patient presented for DM follow up. A1c improved to 6.0%.  01/26/20 Carolann Littler, MD: Patient presented for diabetes follow up. A1c stable at 6.3%.   01/26/20 Ofilia Neas, LPN: Patient presented for medicare annual wellness visit.   11/24/19 Carolann Littler, MD: Patient presented for chronic pruritis follow up.   Recent consult visits: 02/08/20 Colon Branch (gastro): Patient presented for iron deficiency anemia for endoscopy.  12/26/19 Albert Rhoton (gastro): Patient presented for iron deficiency anemia evaluation.  11/03/19 Milagros Evener (dermatology): Patient presented for pruritis work up.  Hospital visits: None in previous 6 months  Objective:  Lab Results  Component Value Date   CREATININE 1.12 04/26/2020   BUN 15 04/26/2020   GFR  61.26 04/26/2020   GFRNONAA 80.38 01/23/2010   GFRAA 95 01/10/2008   NA 142 04/26/2020   K 4.1 04/26/2020   CALCIUM 9.2 04/26/2020   CO2 29 04/26/2020    Lab Results  Component Value Date/Time   HGBA1C 6.0 (A) 07/26/2020 08:15 AM   HGBA1C 6.7 (H) 04/26/2020 08:51 AM   HGBA1C 6.3 (H) 01/26/2020 09:44 AM   GFR 61.26 04/26/2020 08:51 AM   GFR 77.68 04/21/2019 09:52 AM   MICROALBUR 6.2 (H) 03/30/2016 08:26 AM   MICROALBUR 5.5 (H) 03/11/2015 08:05 AM    Last diabetic Eye exam:  Lab Results  Component Value Date/Time   HMDIABEYEEXA No Retinopathy 03/28/2018 12:00 AM    Last diabetic Foot exam:  Lab Results  Component Value Date/Time   HMDIABFOOTEX normal 08/06/2014 12:00 AM     Lab Results  Component Value Date   CHOL 106 04/26/2020   HDL 36.70 (L) 04/26/2020   LDLCALC 56 04/26/2020   TRIG 66.0 04/26/2020   CHOLHDL 3 04/26/2020    Hepatic Function Latest Ref Rng & Units 04/26/2020 04/21/2019 04/18/2018  Total Protein 6.0 - 8.3 g/dL 6.4 6.9 6.5  Albumin 3.5 - 5.2 g/dL 4.1 4.3 3.9  AST 0 - 37 U/L _0 ALT 0 - 53 U/L _1 Alk Phosphatase 39 - 117 U/L 51 66 55  Total Bilirubin 0.2 - 1.2 mg/dL 0.6 0.7 0.4  Bilirubin, Direct 0.0 - 0.3 mg/dL - 0.2 0.1    Lab Results  Component Value Date/Time   TSH 0.74 04/21/2019 09:52 AM   TSH 0.61 04/18/2018 08:48 AM  CBC Latest Ref Rng & Units 04/26/2020 01/26/2020 09/12/2019  WBC 4.0 - 10.5 K/uL 5.8 7.4 5.2  Hemoglobin 13.0 - 17.0 g/dL 11.5(L) 11.8(L) 11.5(A)  Hematocrit 39.0 - 52.0 % 34.3(L) 35.7(L) 33(A)  Platelets 150.0 - 400.0 K/uL 287.0 287 -    Lab Results  Component Value Date/Time   VD25OH 35 01/26/2020 09:44 AM   VD25OH 67.51 07/21/2019 09:44 AM   VD25OH 16.77 (L) 04/21/2019 09:52 AM    Clinical ASCVD: No  The ASCVD Risk score Mikey Bussing DC Jr., et al., 2013) failed to calculate for the following reasons:   The 2013 ASCVD risk score is only valid for ages 6 to 66    Depression screen PHQ 2/9 01/26/2020  04/21/2019 04/18/2018  Decreased Interest 0 0 0  Down, Depressed, Hopeless 0 0 0  PHQ - 2 Score 0 0 0  Altered sleeping 0 0 0  Tired, decreased energy 0 0 0  Change in appetite 0 0 0  Feeling bad or failure about yourself  0 0 0  Trouble concentrating 0 0 0  Moving slowly or fidgety/restless 0 0 0  Suicidal thoughts 0 0 0  PHQ-9 Score 0 0 0  Difficult doing work/chores Not difficult at all Not difficult at all Not difficult at all      Social History   Tobacco Use  Smoking Status Never  Smokeless Tobacco Never   BP Readings from Last 3 Encounters:  07/26/20 124/60  04/26/20 116/68  01/26/20 114/68   Pulse Readings from Last 3 Encounters:  07/26/20 74  04/26/20 63  01/26/20 68   Wt Readings from Last 3 Encounters:  07/26/20 166 lb 9.6 oz (75.6 kg)  04/26/20 169 lb (76.7 kg)  01/26/20 170 lb (77.1 kg)    Assessment/Interventions: Review of patient past medical history, allergies, medications, health status, including review of consultants reports, laboratory and other test data, was performed as part of comprehensive evaluation and provision of chronic care management services.   SDOH:  (Social Determinants of Health) assessments and interventions performed: Yes   Patient lives with his wife and they are both retired. He spends his day watching the news in morning and then eats breakfast and runs some errands such as going to the doctor, drug store and grocery store. His wife has been in and out of the hospital lately.  He has a son that lives in [place].  Patient's wife cooks on Sunday and they eat leftovers for 3-4 days for the week and they sometimes get takeout about 1-2 days a week. He tends to eat what he wants and most meals include vegetables and meat and he eats a variety of meat.   Patient walks every day around the neighborhood for about 30 minutes per day. He was going to fitness center prior to [place] and has been thinking about going back lately.   Patient  sleeps well and denies and trouble falling asleep or staying asleep. He gets about 7-8 hours per night, and does not take naps or feel tired during the day.  Patient denies any problems with his medications.   CCM Care Plan  No Known Allergies  Medications Reviewed Today     Reviewed by Eulas Post, MD (Physician) on 07/26/20 at [phone]  Med List Status: <None>   Medication Order Taking? Sig Documenting Provider Last Dose Status Informant  atorvastatin (LIPITOR) 40 MG tablet [id number] Yes TAKE 1 TABLET BY MOUTH EVERY DAY Burchette, Alinda Sierras, MD Taking Active  B-D TB SYRINGE 1CC/27GX1/2" 27G X 1/2" 1 ML MISC [id number] Yes USE AS DIRECTED WITH METHOTREXATE [provider] Taking Active   betamethasone dipropionate 0.05 % cream [id number] Yes Apply 1 application topically 2 (two) times daily. [provider] Taking Active   Cholecalciferol (VITAMIN D3) 10 MCG (400 UNIT) tablet [id number] Yes Take 1,000 Units by mouth daily. [provider] Taking Active   clindamycin (CLINDAGEL) 1 % gel [id number] Yes Apply 1 application topically as needed. [provider] Taking Active   diclofenac Sodium (VOLTAREN) 1 % GEL [id number] Yes SMARTSIG:Gram(s) Topical Twice Daily PRN [provider] Taking Active   fluticasone (FLONASE) 50 MCG/ACT nasal spray [id number] Yes INSERT 2 SPRAYS IN EACH NOSTRIL EVERY DAY Burchette, Alinda Sierras, MD Taking Active   folic acid (FOLVITE) 1 MG tablet [id number] Yes Take 1 mg by mouth daily. [provider] Taking Active   hydrOXYzine (ATARAX/VISTARIL) 25 MG tablet [id number] Yes Take 1 tablet (25 mg total) by mouth every 8 (eight) hours as needed. Eulas Post, MD Taking Active   latanoprost (XALATAN) 0.005 % ophthalmic solution [id number] Yes PLACE 1 DROP IN BOTH EYES AT BEDTIME [provider] Taking Active            Med Note Carmell Austria, CARMEN E   Mon Nov 12, 2014  8:39 AM) Received from: External Pharmacy   losartan (COZAAR) 50 MG tablet [id number] Yes Take 1 tablet (50 mg total) by mouth daily. Eulas Post, MD Taking Active   metFORMIN (GLUCOPHAGE) 850 MG tablet [id number] Yes TAKE 1 TABLET BY MOUTH TWICE A DAY W/ MEAL Burchette, Alinda Sierras, MD Taking Active   methotrexate 50 MG/2ML injection [id number] Yes INJECT 0.8ML WEEKLY [provider] Taking Active   OneTouch Delica Lancets 28M MISC [id number] Yes Use as instructed to check blood sugars 2 times daily. Dx Code E11.9 Eulas Post, MD Taking Active   [facility] VERIO test strip [id number] Yes USE AS INSTRUCTED TO CHECK BLOOD SUGARS 2 TIMES DAILY. DX CODE E11.9 Burchette, Alinda Sierras, MD Taking Active   timolol (TIMOPTIC) 0.5 % ophthalmic solution [id number] Yes Place 1 drop into both eyes 2 (two) times daily. [provider] Taking Active   triamcinolone cream (KENALOG) 0.1 % [id number] Yes APPLY 1 APPLICATION 2 TIMES DAILY Burchette, Alinda Sierras, MD Taking Active             Patient Active Problem List   Diagnosis Date Noted   HTN (hypertension) 04/26/2020   Right inguinal hernia 04/26/2020   GERD (gastroesophageal reflux disease) 11/10/2019   Glaucoma 11/10/2019   Rheumatoid arthritis ([place]) 08/12/2018   Right rotator cuff tear 92/02/69   Uncomplicated type 2 diabetes mellitus ([place]) 08/28/2013   Type 2 diabetes mellitus ([place]) 08/07/2011   LIVER FUNCTION TESTS, ABNORMAL, HX OF 12/02/2009   Vitamin D deficiency 01/10/2008   Hyperlipidemia 01/06/2007   ERECTILE DYSFUNCTION 01/06/2007   HYPERTENSION, BENIGN ESSENTIAL 01/06/2007   ARTHRITIS 01/06/2007    Immunization History  Administered Date(s) Administered   Fluad Quad(high Dose 65+) 10/27/2018, 12/22/2018   Influenza Split 11/20/2010, 11/13/2011   Influenza Whole 12/31/2006, 11/22/2008, 11/14/2009   Influenza, High Dose Seasonal PF 11/23/2013, 11/12/2014, 12/09/2015, 12/11/2016, 11/19/2017   Influenza,inj,Quad PF,6+ Mos 10/31/2012   Influenza-Unspecified  11/23/2019   Moderna SARS-COV2 Booster Vaccination 12/18/2019, 06/17/2020   Moderna Sars-Covid-2 Vaccination 03/06/2019, 04/03/2019   Pneumococcal Conjugate-13 05/01/2013   Pneumococcal Polysaccharide-23 03/01/2003   Tdap 04/17/2010   Zoster Recombinat (Shingrix) 09/11/2016, 11/30/2016   Zoster, Live 03/06/2011  Conditions to be addressed/monitored:  Hypertension, Hyperlipidemia, Diabetes and vitamin D deficiency, RA, and pruritis  Care Plan : [place]  Updates made by Viona Gilmore, [place] since 08/23/2020 12:00 AM     Problem: Problem: Hypertension, Hyperlipidemia, Diabetes and vitamin D deficiency, RA, and pruritis      Long-Range Goal: Patient-Specific Goal   Start Date: 04/26/2020  Expected End Date: 04/26/2021  Recent Progress: On track  Priority: High  Note:   Current Barriers:  Unable to independently monitor therapeutic efficacy  Pharmacist Clinical Goal(s):  patient will achieve adherence to monitoring guidelines and medication adherence to achieve therapeutic efficacy through collaboration with PharmD and provider.   Interventions: 1:1 collaboration with Eulas Post, MD regarding development and update of comprehensive plan of care as evidenced by provider attestation and co-signature Inter-disciplinary care team collaboration (see longitudinal plan of care) Comprehensive medication review performed; medication list updated in electronic medical record  Hypertension (BP goal <140/90) -Controlled -Current treatment: Losartan 50 mg 1 tablet daily -Medications previously tried: quinapril-HCTZ  -Current home readings: not checking at home -Current dietary habits: limits salt intake and recommended lite salt to further decrease -Current exercise habits: walking for about 30 minutes per day -Denies hypotensive/hypertensive symptoms -Educated on Exercise goal of 150 minutes per week; Importance of home blood pressure monitoring; -Counseled to  monitor BP at home weekly, document, and provide log at future appointments -Counseled on diet and exercise extensively Recommended to continue current medication Counseled on importance of home blood pressure monitoring  Hyperlipidemia: (LDL goal < 100) -Controlled -Current treatment: Atorvastatin 40 mg 1 tablet daily -Medications previously tried: none  -Current dietary patterns: did not dicuss -Current exercise habits: walking daily -Educated on Cholesterol goals;  Importance of limiting foods high in cholesterol; Exercise goal of 150 minutes per week; -Recommended to continue current medication  Diabetes (A1c goal <7%) -Controlled -Current medications: Metformin 850 mg 1 tablet twice daily -Medications previously tried: none  -Current home glucose readings (2-3 times a week) fasting glucose: 98-122 post prandial glucose: n/a -Denies hypoglycemic/hyperglycemic symptoms -Current meal patterns:  breakfast: n/a  lunch: n/a  dinner: n/a snacks: limiting ice cream drinks: n/a -Current exercise: walking daily -Educated on Exercise goal of 150 minutes per week; Carbohydrate counting and/or plate method -Counseled to check feet daily and get yearly eye exams -Recommended to continue current medication  Vitamin D deficiency (Goal: 30-100) -Controlled -Current treatment  Vitamin D 1000 units daily -Medications previously tried: none  -Recommended to continue current medication  Rheumatoid arthritis (Goal: prevent flare ups and minimize symptoms) -Controlled -Current treatment  Methotrexate 50 mg/57m inject 0.8 ml weekly Folic acid 1 mg 1 tablet daily -Medications previously tried: none  -Recommended to continue current medication  Anemia (Goal: HgB > 11) -Uncontrolled -Current treatment  No medications -Medications previously tried: ferrous sulfate (completed)  -Counseled on diet and exercise extensively  Glaucoma (Goal: lower intraocular  pressure) -Controlled -Current treatment  Latanoprost 1 drop in both eyes at bedtime Timolol 0.5% 1 drop in both eyes twice daily -Medications previously tried: none  -Recommended to continue current medication  Topicals/pruritis (Goal: minimize symptoms) -Controlled -Current treatment  Betamethasone cream 0.05% apply twice daily Clindamycin 1% gel daily Lidocaine-hydrocortisone 3-0.5% cream Triamcinolone 0.1% cream apply twice daily -Medications previously tried: none  -Recommended to continue current medication Educated on possible reasons for a rash from other medications and can consider a trial without some of them to see if this is causing it but patient preferred  to hold off for now   Health Maintenance -Vaccine gaps: tetanus (patient will call insurance/pharmacy about coverage) -Current therapy:  Diclofenac gel 1% as needed Flonase nasal spray as needed -Educated on Cost vs benefit of each product must be carefully weighed by individual consumer -Patient is satisfied with current therapy and denies issues -Recommended to continue current medication  Patient Goals/Self-Care Activities patient will:  - take medications as prescribed check glucose 2-3 times a week at different times, document, and provide at future appointments check blood pressure weekly, document, and provide at future appointments  Follow Up Plan: Telephone follow up appointment with care management team member scheduled for: 6 months       Medication Assistance: None required.  Patient affirms current coverage meets needs.  Compliance/Adherence/Medication fill history: Care Gaps: Tetanus   Star-Rating Drugs: Atorvastatin 24m - [facility] on 07/02/20 [facility] Losartan 53m- last filled on 06/24/20 [facility] Metformin 85096m last filled on 06/16/20 [facility]DS ar [facility]  [facility]  [id number] [place] [facility] [address] [place] [id number]: [phone]: [phone] pill box? Yes Pt endorses 100% compliance  We discussed: Benefits of medication synchronization, packaging and delivery as well as enhanced pharmacist oversight with [employer]. Patient decided to: Continue current medication management strategy  Care Plan and Follow Up Patient Decision:  Patient agrees to Care Plan and Follow-up.  Plan: Telephone follow up appointment with care management team member scheduled for:  6 months  MadeJeni SallesarmD BCACMidwayrmacist [phone]

## 2020-09-02 DIAGNOSIS — H401132 Primary open-angle glaucoma, bilateral, moderate stage: Secondary | ICD-10-CM | POA: Diagnosis not present

## 2020-09-02 DIAGNOSIS — H2513 Age-related nuclear cataract, bilateral: Secondary | ICD-10-CM | POA: Diagnosis not present

## 2020-09-02 DIAGNOSIS — H04123 Dry eye syndrome of bilateral lacrimal glands: Secondary | ICD-10-CM | POA: Diagnosis not present

## 2020-09-02 DIAGNOSIS — E119 Type 2 diabetes mellitus without complications: Secondary | ICD-10-CM | POA: Diagnosis not present

## 2020-09-19 ENCOUNTER — Other Ambulatory Visit: Payer: Self-pay | Admitting: Family Medicine

## 2020-11-01 DIAGNOSIS — R5382 Chronic fatigue, unspecified: Secondary | ICD-10-CM | POA: Diagnosis not present

## 2020-11-01 DIAGNOSIS — M2550 Pain in unspecified joint: Secondary | ICD-10-CM | POA: Diagnosis not present

## 2020-11-01 DIAGNOSIS — M0579 Rheumatoid arthritis with rheumatoid factor of multiple sites without organ or systems involvement: Secondary | ICD-10-CM | POA: Diagnosis not present

## 2020-11-05 ENCOUNTER — Telehealth: Payer: Self-pay | Admitting: Family Medicine

## 2020-11-05 NOTE — Telephone Encounter (Signed)
Left a detailed message on verified voice mail informing the patient that per Dr. [name] it is ok for him to get both vaccines.

## 2020-11-05 NOTE — Telephone Encounter (Signed)
Patient called to ask if he was ok to get the newest covid vaccine, as well as the flu shot. Patient states he just wants to be sure because of his history.      Good callback number is [phone]     Please Advise

## 2020-11-25 ENCOUNTER — Ambulatory Visit (INDEPENDENT_AMBULATORY_CARE_PROVIDER_SITE_OTHER): Payer: Medicare Other | Admitting: Family Medicine

## 2020-11-25 ENCOUNTER — Other Ambulatory Visit: Payer: Self-pay

## 2020-11-25 VITALS — BP 130/70 | HR 65 | Temp 98.1°F | Wt 168.3 lb

## 2020-11-25 DIAGNOSIS — I10 Essential (primary) hypertension: Secondary | ICD-10-CM

## 2020-11-25 DIAGNOSIS — E785 Hyperlipidemia, unspecified: Secondary | ICD-10-CM | POA: Diagnosis not present

## 2020-11-25 DIAGNOSIS — E119 Type 2 diabetes mellitus without complications: Secondary | ICD-10-CM | POA: Diagnosis not present

## 2020-11-25 LAB — POCT GLYCOSYLATED HEMOGLOBIN (HGB A1C): Hemoglobin A1C: 6.3 % — AB (ref 4.0–5.6)

## 2020-11-25 NOTE — Progress Notes (Signed)
Established Patient Office Visit  Subjective:  Patient ID: Kevin Wise, male    DOB: 02-07-1936  Age: 85 y.o. MRN: [id number]  CC:  Chief Complaint  Patient presents with   Follow-up    diabetes    HPI Kevin Wise presents for routine medical follow-up.  He has hypertension which is been controlled with losartan.  No recent headaches or dizziness.  Blood pressures been stable.  Renal function stable.  He has type 2 diabetes which has been controlled.  Last A1c 6.0%.  Takes metformin.  No polyuria or polydipsia.  He has rheumatoid arthritis which has been controlled fairly well with weekly methotrexate injections.  He is followed by rheumatology for that.  He does have some chronic normocytic anemia which has been stable.  Generally feels well.  Remains on high-dose atorvastatin for hyperlipidemia.  Lipids were checked last March.  Denies any significant myalgias.  Has not had flu vaccine yet but plans to get sometime within the next month when his wife gets her injection.  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   ARTHRITIS 01/06/2007   CAROTID ARTERY STENOSIS, RIGHT 01/10/2009   Diabetes mellitus without complication (HCC)    ERECTILE DYSFUNCTION 01/06/2007   HYPERGLYCEMIA, BORDERLINE 12/02/2009   HYPERLIPIDEMIA 01/06/2007   HYPERTENSION, BENIGN ESSENTIAL 01/06/2007   HYPOTHYROIDISM 01/10/2008   LIVER FUNCTION TESTS, ABNORMAL, HX OF 12/02/2009   Other symptoms involving cardiovascular system 01/10/2009   Rosacea 01/06/2007   UNS ADVRS EFF OTH RX MEDICINAL&BIOLOGICAL SBSTNC 01/10/2008   VITAMIN D DEFICIENCY 01/10/2008    Past Surgical History:  Procedure Laterality Date   HERNIA REPAIR     bilateral inguinal    TONSILLECTOMY      Family History  Problem Relation Age of Onset   Hyperlipidemia Mother    Diabetes Mother     Social History   Socioeconomic History   Marital status: Married    Spouse name: Not on file   Number of children: Not  on file   Years of education: Not on file   Highest education level: Not on file  Occupational History   Not on file  Tobacco Use   Smoking status: Never   Smokeless tobacco: Never  Vaping Use   Vaping Use: Never used  Substance and Sexual Activity   Alcohol use: Not on file   Drug use: No   Sexual activity: Not on file  Other Topics Concern   Not on file  Social History Narrative   Not on file   Social Determinants of Health   Financial Resource Strain: Low Risk    Difficulty of Paying Living Expenses: Not hard at all  Food Insecurity: No Food Insecurity   Worried About Programme researcher, broadcasting/film/video in the Last Year: Never true   Barista in the Last Year: Never true  Transportation Needs: No Transportation Needs   Lack of Transportation (Medical): No   Lack of Transportation (Non-Medical): No  Physical Activity: Inactive   Days of Exercise per Week: 0 days   Minutes of Exercise per Session: 0 min  Stress: No Stress Concern Present   Feeling of Stress : Not at all  Social Connections: Moderately Integrated   Frequency of Communication with Friends and Family: More than three times a week   Frequency of Social Gatherings with Friends and Family: More than three times a week   Attends Religious Services: More than 4 times per year   Active  Member of Clubs or Organizations: No   Attends Engineer, structural: Never   Marital Status: Married  Catering manager Violence: Not At Risk   Fear of Current or Ex-Partner: No   Emotionally Abused: No   Physically Abused: No   Sexually Abused: No    Outpatient Medications Prior to Visit  Medication Sig Dispense Refill   atorvastatin (LIPITOR) 40 MG tablet TAKE 1 TABLET BY MOUTH EVERY DAY 90 tablet 3   B-D TB SYRINGE 1CC/27GX1/2" 27G X 1/2" 1 ML MISC USE AS DIRECTED WITH METHOTREXATE     betamethasone dipropionate 0.05 % cream Apply 1 application topically 2 (two) times daily.     clindamycin (CLINDAGEL) 1 % gel Apply 1  application topically as needed.     diclofenac Sodium (VOLTAREN) 1 % GEL SMARTSIG:Gram(s) Topical Twice Daily PRN     fluticasone (FLONASE) 50 MCG/ACT nasal spray INSERT 2 SPRAYS IN EACH NOSTRIL EVERY DAY 48 g 1   folic acid (FOLVITE) 1 MG tablet Take 1 mg by mouth daily.     hydrOXYzine (ATARAX/VISTARIL) 25 MG tablet Take 1 tablet (25 mg total) by mouth every 8 (eight) hours as needed. 90 tablet 3   latanoprost (XALATAN) 0.005 % ophthalmic solution PLACE 1 DROP IN BOTH EYES AT BEDTIME  3   losartan (COZAAR) 50 MG tablet TAKE 1 TABLET (50 MG TOTAL) BY MOUTH DAILY. 90 tablet 1   metFORMIN (GLUCOPHAGE) 850 MG tablet TAKE 1 TABLET BY MOUTH TWICE A DAY W/ MEAL 180 tablet 2   methotrexate 50 MG/2ML injection INJECT 0.8ML WEEKLY     OneTouch Delica Lancets 33G MISC Use as instructed to check blood sugars 2 times daily. Dx Code E11.9 200 each 3   ONETOUCH VERIO test strip USE AS INSTRUCTED TO CHECK BLOOD SUGARS 2 TIMES DAILY. DX CODE E11.9 100 strip 3   timolol (TIMOPTIC) 0.5 % ophthalmic solution Place 1 drop into both eyes 2 (two) times daily.     triamcinolone cream (KENALOG) 0.1 % APPLY 1 APPLICATION 2 TIMES DAILY 454 g 1   No facility-administered medications prior to visit.    No Known Allergies  ROS Review of Systems  Constitutional:  Negative for chills and fever.  Respiratory:  Negative for shortness of breath.   Cardiovascular:  Negative for chest pain.  Endocrine: Negative for polydipsia and polyuria.  Neurological:  Negative for dizziness and headaches.     Objective:    Physical Exam Vitals reviewed.  Cardiovascular:     Rate and Rhythm: Normal rate and regular rhythm.  Pulmonary:     Effort: Pulmonary effort is normal.     Breath sounds: Normal breath sounds.  Musculoskeletal:     Cervical back: Neck supple.     Right lower leg: No edema.     Left lower leg: No edema.  Lymphadenopathy:     Cervical: No cervical adenopathy.  Skin:    Comments: Feet reveal no skin  lesions. Good distal foot pulses. Good capillary refill. No calluses. Normal sensation with monofilament testing     BP 130/70 (BP Location: Left Arm, Patient Position: Sitting, Cuff Size: Normal)   Pulse 65   Temp 98.1 F (36.7 C) (Oral)   Wt 168 lb 4.8 oz (76.3 kg)   SpO2 100%   BMI 26.36 kg/m  Wt Readings from Last 3 Encounters:  11/25/20 168 lb 4.8 oz (76.3 kg)  07/26/20 166 lb 9.6 oz (75.6 kg)  04/26/20 169 lb (76.7 kg)  Health Maintenance Due  Topic Date Due   TETANUS/TDAP  04/17/2020   COVID-19 Vaccine (5 - Booster for Moderna series) 10/17/2020    There are no preventive care reminders to display for this patient.  Lab Results  Component Value Date   TSH 0.74 04/21/2019   Lab Results  Component Value Date   WBC 5.8 04/26/2020   HGB 11.5 (L) 04/26/2020   HCT 34.3 (L) 04/26/2020   MCV 96.4 04/26/2020   PLT 287.0 04/26/2020   Lab Results  Component Value Date   NA 142 04/26/2020   K 4.1 04/26/2020   CO2 29 04/26/2020   GLUCOSE 97 04/26/2020   BUN 15 04/26/2020   CREATININE 1.12 04/26/2020   BILITOT 0.6 04/26/2020   ALKPHOS 51 04/26/2020   AST 20 04/26/2020   ALT 25 04/26/2020   PROT 6.4 04/26/2020   ALBUMIN 4.1 04/26/2020   CALCIUM 9.2 04/26/2020   GFR 61.26 04/26/2020   Lab Results  Component Value Date   CHOL 106 04/26/2020   Lab Results  Component Value Date   HDL 36.70 (L) 04/26/2020   Lab Results  Component Value Date   LDLCALC 56 04/26/2020   Lab Results  Component Value Date   TRIG 66.0 04/26/2020   Lab Results  Component Value Date   CHOLHDL 3 04/26/2020   Lab Results  Component Value Date   HGBA1C 6.3 (A) 11/25/2020      Assessment & Plan:   #1 type 2 diabetes controlled with A1c today 6.3%.  Continue metformin.  Continue regular eye exams.  His last eye exam was about 3 months ago. -Reminder for flu vaccine which he plans to get within the next month.  He declines getting this today.  #2 hyperlipidemia treated  with atorvastatin. -Lipids reviewed from last March.  Recheck lipids and hepatic panel by next spring -Continue Lipitor 40 mg daily  #3 hypertension controlled with losartan -Continue losartan 50 mg daily     Follow-up: Return in about 4 months (around 03/28/2021).    Evelena Peat, MD

## 2020-11-25 NOTE — Patient Instructions (Signed)
Remember Flu vaccine within the next month.   Let's plan on 4 month follow up .

## 2020-12-06 ENCOUNTER — Telehealth: Payer: Self-pay | Admitting: Pharmacist

## 2020-12-06 NOTE — Chronic Care Management (AMB) (Signed)
Chronic Care Management Pharmacy Assistant   Name: Kevin Wise  MRN: [id number] DOB: 02/18/1936   Reason for Encounter: Disease State/ General Assessment Call.    Conditions to be addressed/monitored: HTN and DMII   Recent office visits:  11/25/20 Evelena Peat MD (PCP) - seen for type 2 diabetes mellitus. No medication changes. Follow up in 4 months.  Recent consult visits:  None.  Hospital visits:  None in previous 6 months  Medications: Outpatient Encounter Medications as of 12/06/2020  Medication Sig Note   atorvastatin (LIPITOR) 40 MG tablet TAKE 1 TABLET BY MOUTH EVERY DAY    B-D TB SYRINGE 1CC/27GX1/2" 27G X 1/2" 1 ML MISC USE AS DIRECTED WITH METHOTREXATE    betamethasone dipropionate 0.05 % cream Apply 1 application topically 2 (two) times daily.    clindamycin (CLINDAGEL) 1 % gel Apply 1 application topically as needed.    diclofenac Sodium (VOLTAREN) 1 % GEL SMARTSIG:Gram(s) Topical Twice Daily PRN    fluticasone (FLONASE) 50 MCG/ACT nasal spray INSERT 2 SPRAYS IN EACH NOSTRIL EVERY DAY    folic acid (FOLVITE) 1 MG tablet Take 1 mg by mouth daily.    hydrOXYzine (ATARAX/VISTARIL) 25 MG tablet Take 1 tablet (25 mg total) by mouth every 8 (eight) hours as needed.    latanoprost (XALATAN) 0.005 % ophthalmic solution PLACE 1 DROP IN BOTH EYES AT BEDTIME 11/12/2014: Received from: External Pharmacy   losartan (COZAAR) 50 MG tablet TAKE 1 TABLET (50 MG TOTAL) BY MOUTH DAILY.    metFORMIN (GLUCOPHAGE) 850 MG tablet TAKE 1 TABLET BY MOUTH TWICE A DAY W/ MEAL    methotrexate 50 MG/2ML injection INJECT 0.8ML WEEKLY    OneTouch Delica Lancets 33G MISC Use as instructed to check blood sugars 2 times daily. Dx Code E11.9    ONETOUCH VERIO test strip USE AS INSTRUCTED TO CHECK BLOOD SUGARS 2 TIMES DAILY. DX CODE E11.9    timolol (TIMOPTIC) 0.5 % ophthalmic solution Place 1 drop into both eyes 2 (two) times daily.    triamcinolone cream (KENALOG) 0.1 % APPLY 1 APPLICATION 2  TIMES DAILY    No facility-administered encounter medications on file as of 12/06/2020.   Fill History: ATORVASTATIN 40 MG TABLET 09/22/2020 90   CLINDAMYCIN PH 1% GEL 10/29/2020 30   FOLIC ACID 1 MG TABLET 11/14/2020 90   LATANOPROST 0.005% EYE DROPS 09/19/2020 90   LOSARTAN POTASSIUM 50 MG TAB 09/19/2020 90   METHOTREXATE 50 MG/2 ML VIAL 11/13/2020 90   TIMOLOL MALEATE 0.5% EYE DROPS 09/19/2020 90   HYDROXYZINE HCL 25 MG TABLET 05/23/2019 30   METFORMIN HCL 850 MG TABLET 09/21/2020 90   Recent Relevant Labs: Lab Results  Component Value Date/Time   HGBA1C 6.3 (A) 11/25/2020 09:09 AM   HGBA1C 6.0 (A) 07/26/2020 08:15 AM   HGBA1C 6.7 (H) 04/26/2020 08:51 AM   HGBA1C 6.3 (H) 01/26/2020 09:44 AM   MICROALBUR 6.2 (H) 03/30/2016 08:26 AM   MICROALBUR 5.5 (H) 03/11/2015 08:05 AM    Kidney Function Lab Results  Component Value Date/Time   CREATININE 1.12 04/26/2020 08:51 AM   CREATININE 1.10 04/21/2019 09:52 AM   GFR 61.26 04/26/2020 08:51 AM   GFRNONAA 80.38 01/23/2010 08:18 AM   GFRAA 95 01/10/2008 12:00 AM    Current antihyperglycemic regimen:  Metformin 850 mg 1 tablet twice daily What recent interventions/DTPs have been made to improve glycemic control:  None. Have there been any recent hospitalizations or ED visits since last visit with CPP? No  Patient denies hypoglycemic symptoms, including None Patient denies hyperglycemic symptoms, including none How often are you checking your blood sugar? A couple of times a week.  What are your blood sugars ranging?  Fasting: 90s-110. During the week, how often does your blood glucose drop below 70? Never Are you checking your feet daily/regularly? Yes. He checks them daily.   Adherence Review: Is the patient currently on a STATIN medication? Yes Is the patient currently on ACE/ARB medication? Yes Does the patient have >5 day gap between last estimated fill dates? No   Reviewed chart prior to disease state call.  Spoke with patient regarding BP  Recent Office Vitals: BP Readings from Last 3 Encounters:  11/25/20 130/70  07/26/20 124/60  04/26/20 116/68   Pulse Readings from Last 3 Encounters:  11/25/20 65  07/26/20 74  04/26/20 63    Wt Readings from Last 3 Encounters:  11/25/20 168 lb 4.8 oz (76.3 kg)  07/26/20 166 lb 9.6 oz (75.6 kg)  04/26/20 169 lb (76.7 kg)     Kidney Function Lab Results  Component Value Date/Time   CREATININE 1.12 04/26/2020 08:51 AM   CREATININE 1.10 04/21/2019 09:52 AM   GFR 61.26 04/26/2020 08:51 AM   GFRNONAA 80.38 01/23/2010 08:18 AM   GFRAA 95 01/10/2008 12:00 AM    BMP Latest Ref Rng & Units 04/26/2020 04/21/2019 04/18/2018  Glucose 70 - 99 mg/dL 97 92 570(V)  BUN 6 - 23 mg/dL 15 17 22   Creatinine 0.40 - 1.50 mg/dL 7.79 3.90  Sodium 135 - 145 mEq/L 142 138 140  Potassium 3.5 - 5.1 mEq/L 4.1 4.1 4.3  Chloride 96 - 112 mEq/L 106 101 101  CO2 19 - 32 mEq/L 29 29 31   Calcium 8.4 - 10.5 mg/dL 9.2 9.6 9.6    Current antihypertensive regimen:  Losartan 50 mg 1 tablet daily How often are you checking your Blood Pressure? infrequently Current home BP readings: 140/80 on 12/02/20. What recent interventions/DTPs have been made by any provider to improve Blood Pressure control since last CPP Visit: None. Any recent hospitalizations or ED visits since last visit with CPP? No  Adherence Review: Is the patient currently on ACE/ARB medication? Yes Does the patient have >5 day gap between last estimated fill dates? No  Notes: Spoke with patient and reviewed all medications as prescribed. Patient reports taking all medications. Patient stated he has has excessive itching for years and it is flaring bad right now. He states the hydroxyzine is not helping. Patient would like for , Pharm D to look over his medication history and current medications to see if something he has taken or is taking could be causing the itching. Message sent to Gaylord Shih to make aware. Patient stated he eats what he wants for the most part and he gets plenty of activity around his house. He does not usually add salt to his food. Patient was reminded to check blood pressure weekly and keep a log as well as log his blood sugars. Patient was agreeable. Patient thanked me for my call.   Care Gaps:  AWV- scheduled for 01/27/21 Tetanus/TDAP - overdue since 2/22 Covid-19 vaccine booster 5 - overdue Last PCP BP - 130/70 P: 65 on 11/25/20  Recent A1C: Lab Results  Component Value Date   HGBA1C 6.3 (A) 11/25/2020     Star Rating Drugs:  Atorvastatin 40mg  - [facility] Losartan 50mg  - [facility]  Metformin 850mg  - [facility] filled [facility]   09/23/20 CMA  Clinical Pharmacist Assistant [phone]

## 2020-12-10 NOTE — Telephone Encounter (Cosign Needed)
2nd attempt

## 2020-12-13 NOTE — Telephone Encounter (Cosign Needed)
3rd attempt

## 2020-12-19 ENCOUNTER — Other Ambulatory Visit: Payer: Self-pay

## 2020-12-19 ENCOUNTER — Telehealth: Payer: Self-pay

## 2020-12-19 DIAGNOSIS — E119 Type 2 diabetes mellitus without complications: Secondary | ICD-10-CM

## 2020-12-19 MED ORDER — BLOOD GLUCOSE METER KIT: PACK | 0 refills | Status: DC

## 2020-12-19 NOTE — Telephone Encounter (Signed)
Patient called asking for a call back no information given 

## 2020-12-19 NOTE — Telephone Encounter (Signed)
Noted  

## 2020-12-19 NOTE — Telephone Encounter (Signed)
Called spoke with patient. He stated that his blood glucose meter had given him some different reading as what is normal for him, which his reading are usually in the low 100's.   Reading today was 8:30 am, unable to read, 9:40 am 205, 213 at 11:15 took Metformin reading was 136.    Patient stated that his meter expired in 2016, and he has requested [facility] [address].   Prescription for new meter has been sent, patient is aware.

## 2021-01-06 ENCOUNTER — Telehealth: Payer: Self-pay

## 2021-01-06 DIAGNOSIS — E119 Type 2 diabetes mellitus without complications: Secondary | ICD-10-CM | POA: Diagnosis not present

## 2021-01-06 DIAGNOSIS — H35033 Hypertensive retinopathy, bilateral: Secondary | ICD-10-CM | POA: Diagnosis not present

## 2021-01-06 DIAGNOSIS — H25813 Combined forms of age-related cataract, bilateral: Secondary | ICD-10-CM | POA: Diagnosis not present

## 2021-01-06 DIAGNOSIS — H401132 Primary open-angle glaucoma, bilateral, moderate stage: Secondary | ICD-10-CM | POA: Diagnosis not present

## 2021-01-06 DIAGNOSIS — H43821 Vitreomacular adhesion, right eye: Secondary | ICD-10-CM | POA: Diagnosis not present

## 2021-01-06 LAB — HM DIABETES EYE EXAM

## 2021-01-06 NOTE — Telephone Encounter (Signed)
Please advise. Do you know of any male providers in this office or else where that are taking new patients?

## 2021-01-06 NOTE — Telephone Encounter (Addendum)
The last time he was here Dr. [name] gave him some names for his wife to find a PCP. They called Jessica Copland and were told she is not taking any new patients.   Patient is Kevin Wise DOB: [birth year]  They would like to know if Dr. Patsy Lager would take her as a new patient.  Please advise.

## 2021-01-06 NOTE — Telephone Encounter (Signed)
Patient called asking for a call back patient has a question regarding last office visit

## 2021-01-07 ENCOUNTER — Telehealth: Payer: Self-pay

## 2021-01-07 NOTE — Telephone Encounter (Signed)
Patient called asking for a call back by the end of the day concerning medication

## 2021-01-07 NOTE — Telephone Encounter (Signed)
Called and spoke with the patient. He stated he had already figured out what he needed and that the prescription was actually from dermatology not from here. Nothing further needed

## 2021-01-07 NOTE — Telephone Encounter (Signed)
Spoke with Fayrene Fearing. He is aware of the messages from Dr. [name] and Ebony Hail. They will check the website and see if they can find someone else.

## 2021-01-10 ENCOUNTER — Other Ambulatory Visit: Payer: Self-pay | Admitting: Family Medicine

## 2021-01-21 ENCOUNTER — Other Ambulatory Visit: Payer: Self-pay | Admitting: Family Medicine

## 2021-01-21 ENCOUNTER — Telehealth: Payer: Self-pay | Admitting: Family Medicine

## 2021-01-21 NOTE — Telephone Encounter (Signed)
Patient called to get new prescription for triamcinolone cream (KENALOG) 0.1 %     Please send to  [id number] [place],  - [address] AT [address] Phone:  [phone]  Fax:  [phone]           Good callback number is [phone]        Please advise

## 2021-01-22 NOTE — Telephone Encounter (Signed)
Patient called again to follow up on new prescription for triamcinolone cream (KENALOG) 0.1 % being [facility] [place]. I let patient know after he asked for Lillia Abed that it was being worked on and that Lillia Abed was not in today but I would leave a reminder message for the team   Please send to  [id number] [place], [place] - [address] AT [address] Phone:  [phone]  Fax:  [phone]        Please advise

## 2021-01-23 ENCOUNTER — Other Ambulatory Visit: Payer: Self-pay

## 2021-01-23 MED ORDER — TRIAMCINOLONE ACETONIDE 0.1 % EX CREA: TOPICAL_CREAM | CUTANEOUS | 1 refills | Status: DC

## 2021-01-23 NOTE — Telephone Encounter (Signed)
Spoke with patient, refill for [facility]cin[facility]one [facility] [address].

## 2021-01-27 ENCOUNTER — Ambulatory Visit (INDEPENDENT_AMBULATORY_CARE_PROVIDER_SITE_OTHER): Payer: Medicare Other

## 2021-01-27 VITALS — Ht 67.0 in | Wt 168.0 lb

## 2021-01-27 DIAGNOSIS — Z0000 Encounter for general adult medical examination without abnormal findings: Secondary | ICD-10-CM | POA: Diagnosis not present

## 2021-01-27 NOTE — Patient Instructions (Addendum)
Kevin Wise , Thank you for taking time to come for your Medicare Wellness Visit. I appreciate your ongoing commitment to your health goals. Please review the following plan we discussed and let me know if I can assist you in the future.   These are the goals we discussed:  Goals      Exercise 3x per week (30 min per time)     Patient Stated     I would like to maintain my health.        This is a list of the screening recommended for you and due dates:  Health Maintenance  Topic Date Due   COVID-19 Vaccine (3 - Moderna risk series) 07/15/2020   Flu Shot  05/23/2021*   Tetanus Vaccine  01/27/2022*   Hemoglobin A1C  05/26/2021   Complete foot exam   07/26/2021   Eye exam for diabetics  01/06/2022   Pneumonia Vaccine  Completed   Zoster (Shingles) Vaccine  Completed   HPV Vaccine  Aged Out  *Topic was postponed. The date shown is not the original due date.   Advanced directives: Yes  Conditions/risks identified: None  Next appointment: Follow up in one year for your annual wellness visit.   Preventive Care 71 Years and Older, Male Preventive care refers to lifestyle choices and visits with your health care provider that can promote health and wellness. What does preventive care include? A yearly physical exam. This is also called an annual well check. Dental exams once or twice a year. Routine eye exams. Ask your health care provider how often you should have your eyes checked. Personal lifestyle choices, including: Daily care of your teeth and gums. Regular physical activity. Eating a healthy diet. Avoiding tobacco and drug use. Limiting alcohol use. Practicing safe sex. Taking low doses of aspirin every day. Taking vitamin and mineral supplements as recommended by your health care provider. What happens during an annual well check? The services and screenings done by your health care provider during your annual well check will depend on your age, overall health,  lifestyle risk factors, and family history of disease. Counseling  Your health care provider may ask you questions about your: Alcohol use. Tobacco use. Drug use. Emotional well-being. Home and relationship well-being. Sexual activity. Eating habits. History of falls. Memory and ability to understand (cognition). Work and work Astronomer. Screening  You may have the following tests or measurements: Height, weight, and BMI. Blood pressure. Lipid and cholesterol levels. These may be checked every 5 years, or more frequently if you are over 44 years old. Skin check. Lung cancer screening. You may have this screening every year starting at age 40 if you have a 30-pack-year history of smoking and currently smoke or have quit within the past 15 years. Fecal occult blood test (FOBT) of the stool. You may have this test every year starting at age 33. Flexible sigmoidoscopy or colonoscopy. You may have a sigmoidoscopy every 5 years or a colonoscopy every 10 years starting at age 7. Prostate cancer screening. Recommendations will vary depending on your family history and other risks. Hepatitis C blood test. Hepatitis B blood test. Sexually transmitted disease (STD) testing. Diabetes screening. This is done by checking your blood sugar (glucose) after you have not eaten for a while (fasting). You may have this done every 1-3 years. Abdominal aortic aneurysm (AAA) screening. You may need this if you are a current or former smoker. Osteoporosis. You may be screened starting at age 88 if you  are at high risk. Talk with your health care provider about your test results, treatment options, and if necessary, the need for more tests. Vaccines  Your health care provider may recommend certain vaccines, such as: Influenza vaccine. This is recommended every year. Tetanus, diphtheria, and acellular pertussis (Tdap, Td) vaccine. You may need a Td booster every 10 years. Zoster vaccine. You may need this  after age 32. Pneumococcal 13-valent conjugate (PCV13) vaccine. One dose is recommended after age 81. Pneumococcal polysaccharide (PPSV23) vaccine. One dose is recommended after age 55. Talk to your health care provider about which screenings and vaccines you need and how often you need them. This information is not intended to replace advice given to you by your health care provider. Make sure you discuss any questions you have with your health care provider. Document Released: 03/08/2015 Document Revised: 10/30/2015 Document Reviewed: 12/11/2014 Elsevier Interactive Patient Education  2017 [employer].  Fall Prevention in the Home Falls can cause injuries. They can happen to people of all ages. There are many things you can do to make your home safe and to help prevent falls. What can I do on the outside of my home? Regularly fix the edges of walkways and driveways and fix any cracks. Remove anything that might make you trip as you walk through a door, such as a raised step or threshold. Trim any bushes or trees on the path to your home. Use bright outdoor lighting. Clear any walking paths of anything that might make someone trip, such as rocks or tools. Regularly check to see if handrails are loose or broken. Make sure that both sides of any steps have handrails. Any raised decks and porches should have guardrails on the edges. Have any leaves, snow, or ice cleared regularly. Use sand or salt on walking paths during winter. Clean up any spills in your garage right away. This includes oil or grease spills. What can I do in the bathroom? Use night lights. Install grab bars by the toilet and in the tub and shower. Do not use towel bars as grab bars. Use non-skid mats or decals in the tub or shower. If you need to sit down in the shower, use a plastic, non-slip stool. Keep the floor dry. Clean up any water that spills on the floor as soon as it happens. Remove soap buildup in the tub or  shower regularly. Attach bath mats securely with double-sided non-slip rug tape. Do not have throw rugs and other things on the floor that can make you trip. What can I do in the bedroom? Use night lights. Make sure that you have a light by your bed that is easy to reach. Do not use any sheets or blankets that are too big for your bed. They should not hang down onto the floor. Have a firm chair that has side arms. You can use this for support while you get dressed. Do not have throw rugs and other things on the floor that can make you trip. What can I do in the kitchen? Clean up any spills right away. Avoid walking on wet floors. Keep items that you use a lot in easy-to-reach places. If you need to reach something above you, use a strong step stool that has a grab bar. Keep electrical cords out of the way. Do not use floor polish or wax that makes floors slippery. If you must use wax, use non-skid floor wax. Do not have throw rugs and other things on the  floor that can make you trip. What can I do with my stairs? Do not leave any items on the stairs. Make sure that there are handrails on both sides of the stairs and use them. Fix handrails that are broken or loose. Make sure that handrails are as long as the stairways. Check any carpeting to make sure that it is firmly attached to the stairs. Fix any carpet that is loose or worn. Avoid having throw rugs at the top or bottom of the stairs. If you do have throw rugs, attach them to the floor with carpet tape. Make sure that you have a light switch at the top of the stairs and the bottom of the stairs. If you do not have them, ask someone to add them for you. What else can I do to help prevent falls? Wear shoes that: Do not have high heels. Have rubber bottoms. Are comfortable and fit you well. Are closed at the toe. Do not wear sandals. If you use a stepladder: Make sure that it is fully opened. Do not climb a closed stepladder. Make  sure that both sides of the stepladder are locked into place. Ask someone to hold it for you, if possible. Clearly mark and make sure that you can see: Any grab bars or handrails. First and last steps. Where the edge of each step is. Use tools that help you move around (mobility aids) if they are needed. These include: Canes. Walkers. Scooters. Crutches. Turn on the lights when you go into a dark area. Replace any light bulbs as soon as they burn out. Set up your furniture so you have a clear path. Avoid moving your furniture around. If any of your floors are uneven, fix them. If there are any pets around you, be aware of where they are. Review your medicines with your doctor. Some medicines can make you feel dizzy. This can increase your chance of falling. Ask your doctor what other things that you can do to help prevent falls. This information is not intended to replace advice given to you by your health care provider. Make sure you discuss any questions you have with your health care provider. Document Released: 12/06/2008 Document Revised: 07/18/2015 Document Reviewed: 03/16/2014 Elsevier Interactive Patient Education  2017 [employer].

## 2021-01-27 NOTE — Progress Notes (Signed)
Subjective:   HYLAND MOLLENKOPF is a 85 y.o. male who presents for Medicare Annual/Subsequent preventive examination.  Review of Systems    No ROS Cardiac Risk Factors include: advanced age (>31mn, >>33women);diabetes mellitus;hypertension    Objective:    Today's Vitals   01/27/21 0817  Weight: 168 lb (76.2 kg)  Height: 5' 7"  (1.702 m)   Body mass index is 26.31 kg/m.  Advanced Directives 01/27/2021 01/26/2020  Does Patient Have a Medical Advance Directive? Yes Yes  Type of [place]  Does patient want to make changes to medical advance directive? No - Patient declined No - Patient declined    Current Medications (verified) Outpatient Encounter Medications as of 01/27/2021  Medication Sig   ACCU-CHEK GUIDE test strip USE AS DIRECTED UP TO 4 TIMES A DAY AS DIRECTED TO CHECK BLOOD SUGAR   atorvastatin (LIPITOR) 40 MG tablet TAKE 1 TABLET BY MOUTH EVERY DAY   B-D TB SYRINGE 1CC/27GX1/2" 27G X 1/2" 1 ML MISC USE AS DIRECTED WITH METHOTREXATE   betamethasone dipropionate 0.05 % cream Apply 1 application topically 2 (two) times daily.   blood glucose meter kit and supplies Dispense based on patient and insurance preference. Use up to four times daily as directed. (FOR ICD-10 E10.9, E11.9).   clindamycin (CLINDAGEL) 1 % gel Apply 1 application topically as needed.   diclofenac Sodium (VOLTAREN) 1 % GEL SMARTSIG:Gram(s) Topical Twice Daily PRN   fluticasone (FLONASE) 50 MCG/ACT nasal spray INSERT 2 SPRAYS IN EACH NOSTRIL EVERY DAY   folic acid (FOLVITE) 1 MG tablet Take 1 mg by mouth daily.   hydrOXYzine (ATARAX/VISTARIL) 25 MG tablet Take 1 tablet (25 mg total) by mouth every 8 (eight) hours as needed.   latanoprost (XALATAN) 0.005 % ophthalmic solution PLACE 1 DROP IN BOTH EYES AT BEDTIME   losartan (COZAAR) 50 MG tablet TAKE 1 TABLET (50 MG TOTAL) BY MOUTH DAILY.   metFORMIN (GLUCOPHAGE) 850 MG  tablet TAKE 1 TABLET BY MOUTH TWICE A DAY W/ MEAL   methotrexate 50 MG/2ML injection INJECT 0.8ML WEEKLY   OneTouch Delica Lancets 363FMISC Use as instructed to check blood sugars 2 times daily. Dx Code E11.9   timolol (TIMOPTIC) 0.5 % ophthalmic solution Place 1 drop into both eyes 2 (two) times daily.   triamcinolone cream (KENALOG) 0.1 % APPLY 1 APPLICATION 2 TIMES DAILY   No facility-administered encounter medications on file as of 01/27/2021.    Allergies (verified) Patient has no known allergies.   History: 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   ARTHRITIS 01/06/2007   CAROTID ARTERY STENOSIS, RIGHT 01/10/2009   Diabetes mellitus without complication ([place]    ERECTILE DYSFUNCTION 01/06/2007   HYPERGLYCEMIA, BORDERLINE 12/02/2009   HYPERLIPIDEMIA 01/06/2007   HYPERTENSION, BENIGN ESSENTIAL 01/06/2007   HYPOTHYROIDISM 01/10/2008   LIVER FUNCTION TESTS, ABNORMAL, HX OF 12/02/2009   Other symptoms involving cardiovascular system 01/10/2009   Rosacea 01/06/2007   UNS ADVRS EFF OTH RX MEDICINAL&BIOLOGICAL SBSTNC 01/10/2008   VITAMIN D DEFICIENCY 01/10/2008   Past Surgical History:  Procedure Laterality Date   HERNIA REPAIR     bilateral inguinal    TONSILLECTOMY     Family History  Problem Relation Age of Onset   Hyperlipidemia Mother    Diabetes Mother    Social History   Socioeconomic History   Marital status: Married    Spouse name: Not on file   Number of  children: Not on file   Years of education: Not on file   Highest education level: Not on file  Occupational History   Not on file  Tobacco Use   Smoking status: Never   Smokeless tobacco: Never  Vaping Use   Vaping Use: Never used  Substance and Sexual Activity   Alcohol use: Not on file   Drug use: No   Sexual activity: Not on file  Other Topics Concern   Not on file  Social History Narrative   Not on file   Social Determinants of Health   Financial Resource Strain: Low Risk     Difficulty of Paying Living Expenses: Not hard at all  Food Insecurity: No Food Insecurity   Worried About Running Out of Food in the Last Year: Never true   [place] in the Last Year: Never true  Transportation Needs: No Transportation Needs   Lack of Transportation (Medical): No   Lack of Transportation (Non-Medical): No  Physical Activity: Insufficiently Active   Days of Exercise per Week: 3 days   Minutes of Exercise per Session: 40 min  Stress: No Stress Concern Present   Feeling of Stress : Not at all  Social Connections: Socially Integrated   Frequency of Communication with Friends and Family: More than three times a week   Frequency of Social Gatherings with Friends and Family: More than three times a week   Attends Religious Services: More than 4 times per year   Active Member of [employer] or Organizations: Yes   Attends Music therapist: More than 4 times per year   Marital Status: Married    Clinical Intake:  Pre-visit preparation completed: YesNutrition Risk Assessment:  Has the patient had any N/V/D within the last 2 months?  No  Does the patient have any non-healing wounds?  No  Has the patient had any unintentional weight loss or weight gain?  No   Diabetes:  Is the patient diabetic?  Yes  If diabetic, was a CBG obtained today?  No  Did the patient bring in their glucometer from home?  No Audio Visit How often do you monitor your CBG's? Patient states 3x per week  Financial Strains and Diabetes Management:  Are you having any financial strains with the device, your supplies or your medication? No .  Does the patient want to be seen by Chronic Care Management for management of their diabetes?  No  Would the patient like to be referred to a Nutritionist or for Diabetic Management?  No Followed by PCP.  Diabetic Exams:  Diabetic Eye Exam: Completed Yes . Followed by Dr Jerline Pain  Diabetic Foot Exam: Completed Yes Followed by PCP.       Diabetic? Yes  Interpreter Needed?: NoActivities of Daily Living In your present state of health, do you have any difficulty performing the following activities: 01/27/2021  Hearing? N  Comment No difficulty hearing  Vision? N  Comment Wears glasses. Followed by Dr Jerline Pain.  Walking or climbing stairs? N  Dressing or bathing? N  Doing errands, shopping? N  Preparing Food and eating ? N  Using the Toilet? N  In the past six months, have you accidently leaked urine? N  Do you have problems with loss of bowel control? N  Managing your Medications? N  Managing your Finances? N  Housekeeping or managing your Housekeeping? N  Some recent data might be hidden    Patient Care Team: Eulas Post, MD as PCP -  General (Family Medicine) Viona Gilmore, [facility] as Pharmacist (Pharmacist)  Indicate any recent Medical Services you may have received from other than [facility] providers in the past year (date may be approximate).     Assessment:   This is a routine wellness examination for Estefan.Virtual Visit via Telephone Note  I connected with  Cole Nation on 01/27/21 at  8:15 AM EST by telephone and verified that I am speaking with the correct person using two identifiers.  Location: Patient: Home Provider: Office Persons participating in the virtual visit: patient/Nurse Health Advisor   I discussed the limitations, risks, security and privacy concerns of performing an evaluation and management service by telephone and the availability of in person appointments. The patient expressed understanding and agreed to proceed.  Interactive audio and video telecommunications were attempted between this nurse and patient, however failed, due to patient having technical difficulties OR patient did not have access to video capability.  We continued and completed visit with audio only.  Some vital signs may be absent or patient reported.   Criselda Peaches, LPN   Hearing/Vision screen Hearing  Screening - Comments:: No difficulty hearing. Vision Screening - Comments:: Wears glasses. Followed by Dr Jerline Pain.  Dietary issues and exercise activities discussed: Current Exercise Habits: Home exercise routine, Type of exercise: walking, Time (Minutes): 40, Frequency (Times/Week): 3, Weekly Exercise (Minutes/Week): 120, Intensity: Mild   Goals Addressed             This Visit's Progress    Patient Stated       I would like to maintain my health.       Depression Screen PHQ 2/9 Scores 01/27/2021 01/26/2020 04/21/2019 04/18/2018 10/11/2017 05/19/2016 06/17/2015  PHQ - 2 Score 0 0 0 0 0 0 0  PHQ- 9 Score - 0 0 0 - - -  Exception Documentation - - - - - - Patient refusal    Fall Risk Fall Risk  01/27/2021 01/26/2020 04/21/2019 04/18/2018 10/11/2017  Falls in the past year? 0 0 0 0 No  Number falls in past yr: 0 0 0 - -  Injury with Fall? 0 0 0 - -  Risk for fall due to : - No Fall Risks - - -  Follow up - Falls evaluation completed;Falls prevention discussed - - -    FALL RISK PREVENTION PERTAINING TO THE HOME:  Any stairs in or around the home? No  If so, are there any without handrails? No  Home free of loose throw rugs in walkways, pet beds, electrical cords, etc? Yes  Adequate lighting in your home to reduce risk of falls? Yes   ASSISTIVE DEVICES UTILIZED TO PREVENT FALLS:  Life alert? No  Use of a cane, walker or w/c? No  Grab bars in the bathroom? No  Shower chair or bench in shower? Yes  Elevated toilet seat or a handicapped toilet? No   TIMED UP AND GO:  Was the test performed? No . Audio Visit  Cognitive Function:     Immunizations Immunization History  Administered Date(s) Administered   Fluad Quad(high Dose 65+) 10/27/2018, 12/22/2018   Influenza Split 11/20/2010, 11/13/2011   Influenza Whole 12/31/2006, 11/22/2008, 11/14/2009   Influenza, High Dose Seasonal PF 11/23/2013, 11/12/2014, 12/09/2015, 12/11/2016, 11/19/2017   Influenza,inj,Quad PF,6+ Mos  10/31/2012   Influenza-Unspecified 11/23/2019   Moderna SARS-COV2 Booster Vaccination 12/18/2019, 06/17/2020   Moderna Sars-Covid-2 Vaccination 03/06/2019, 04/03/2019   Pneumococcal Conjugate-13 05/01/2013   Pneumococcal Polysaccharide-23 03/01/2003   Tdap 04/17/2010  Zoster Recombinat (Shingrix) 09/11/2016, 11/30/2016   Zoster, Live 03/06/2011    Covid-19 vaccine status: Completed vaccines  Screening Tests Health Maintenance  Topic Date Due   COVID-19 Vaccine (3 - Moderna risk series) 07/15/2020   INFLUENZA VACCINE  05/23/2021 (Originally 09/23/2020)   TETANUS/TDAP  01/27/2022 (Originally 04/17/2020)   HEMOGLOBIN A1C  05/26/2021   FOOT EXAM  07/26/2021   OPHTHALMOLOGY EXAM  01/06/2022   Pneumonia Vaccine 62+ Years old  Completed   Zoster Vaccines- Shingrix  Completed   HPV VACCINES  Aged Out    Health Maintenance  Health Maintenance Due  Topic Date Due   COVID-19 Vaccine (3 - Moderna risk series) 07/15/2020    Vision Screening: Recommended annual ophthalmology exams for early detection of glaucoma and other disorders of the eye. Is the patient up to date with their annual eye exam?  Yes  Who is the provider or what is the name of the office in which the patient attends annual eye exams? Followed by Dr Jerline Pain.  Dental Screening: Recommended annual dental exams for proper oral hygiene  Community Resource Referral / Chronic Care Management:  CRR required this visit?  No   CCM required this visit?  No      Plan:     I have personally reviewed and noted the following in the patient's chart:   Medical and social history Use of alcohol, tobacco or illicit drugs  Current medications and supplements including opioid prescriptions. Patient is not currently taking opioid prescriptions. Functional ability and status Nutritional status Physical activity Advanced directives List of other physicians Hospitalizations, surgeries, and ER visits in previous 12  months Vitals Screenings to include cognitive, depression, and falls Referrals and appointments  In addition, I have reviewed and discussed with patient certain preventive protocols, quality metrics, and best practice recommendations. A written personalized care plan for preventive services as well as general preventive health recommendations were provided to patient.     Criselda Peaches, LPN   [id number]

## 2021-01-28 DIAGNOSIS — L738 Other specified follicular disorders: Secondary | ICD-10-CM | POA: Diagnosis not present

## 2021-01-28 DIAGNOSIS — L853 Xerosis cutis: Secondary | ICD-10-CM | POA: Diagnosis not present

## 2021-01-30 DIAGNOSIS — M0579 Rheumatoid arthritis with rheumatoid factor of multiple sites without organ or systems involvement: Secondary | ICD-10-CM | POA: Diagnosis not present

## 2021-02-19 ENCOUNTER — Telehealth: Payer: Self-pay | Admitting: Pharmacist

## 2021-02-19 NOTE — Chronic Care Management (AMB) (Signed)
° ° °  Chronic Care Management Pharmacy Assistant   Name: Kevin Wise  MRN: [id number] DOB: March 07, 1935  02/25/2021 APPOINTMENT REMINDER  Kevin Wise was reminded to have all medications, supplements and any blood glucose and blood pressure readings available for review with Gaylord Shih, Pharm. D, at his telephone visit on 02/25/2021 at 10:00.   Questions: Have you had any recent office visit or specialist visit outside of [facility] systems? Patient denies any visits outside of [facility]  Are there any concerns you would like to discuss during your office visit? Patient denies any concerns.   Are you having any problems obtaining your medications? (Whether it pharmacy issues or cost) Patient denies any issues obtaining medications.  If patient has any PAP medications ask if they are having any problems getting their PAP medication or refill? Patient denies any medications being filled by patient assistance.  Care Gaps: AWV- completed 01/27/21 Covid-19 vaccine - overdue Last  BP - 130/70 on 11/25/2020  Last A1C - 6.3 on 11/25/2020  Star Rating Drug: Atorvastatin 40mg  - [facility]17[facility] Losartan 50mg  - last filled on 12/16/2020 [facility] Metformin 850mg  - last filled on [facility]/17/2020 [facility]  Any gaps in medications fill history? No  12/18/2020 [facility]  12/19/2020 [phone]

## 2021-02-20 ENCOUNTER — Other Ambulatory Visit: Payer: Self-pay | Admitting: Family Medicine

## 2021-02-25 ENCOUNTER — Ambulatory Visit (INDEPENDENT_AMBULATORY_CARE_PROVIDER_SITE_OTHER): Payer: Medicare Other | Admitting: Pharmacist

## 2021-02-25 DIAGNOSIS — E119 Type 2 diabetes mellitus without complications: Secondary | ICD-10-CM

## 2021-02-25 DIAGNOSIS — I10 Essential (primary) hypertension: Secondary | ICD-10-CM

## 2021-02-25 NOTE — Progress Notes (Signed)
Chronic Care Management Pharmacy Note  02/25/2021 Name:  Kevin Wise MRN:  [id number] DOB:  Sep 19, 1935  Summary: A1c at goal < 7% LDL at goal < 100 BP at goal < 140/80 per office and home readings   Recommendations/Changes made from today's visit: -Recommended trial of holding medications to see if this alleviates the rash -Educated on hypoglycemia treatment in detail   Plan: Follow up BP/DM assessment in 6 months  Subjective: Kevin Wise is an 86 y.o. year old male who is a primary patient of Burchette, Alinda Sierras, MD.  The CCM team was consulted for assistance with disease management and care coordination needs.    Engaged with patient by telephone for follow up visit in response to provider referral for pharmacy case management and/or care coordination services.   Consent to Services:  The patient was given information about Chronic Care Management services, agreed to services, and gave verbal consent prior to initiation of services.  Please see initial visit note for detailed documentation.   Patient Care Team: Eulas Post, MD as PCP - General (Family Medicine) Viona Gilmore, [facility] as Pharmacist (Pharmacist)  Recent office visits: 01/27/21 Kevin Arbour, LPN: Patient presented for medicare annual wellness visit.   11/25/20 Kevin Littler MD (PCP) - seen for type 2 diabetes mellitus. No medication changes. Follow up in 4 months.  Recent consult visits: 01/30/21 Kevin Wise (rheumatology): Patient presented for RA follow up. Unable to access notes.  02/08/20 Kevin Wise (gastro): Patient presented for iron deficiency anemia for endoscopy.  Hospital visits: None in previous 6 months  Objective:  Lab Results  Component Value Date   CREATININE 1.12 04/26/2020   BUN 15 04/26/2020   GFR 61.26 04/26/2020   GFRNONAA 80.38 01/23/2010   GFRAA 95 01/10/2008   NA 142 04/26/2020   K 4.1 04/26/2020   CALCIUM 9.2 04/26/2020   CO2 29 04/26/2020    Lab  Results  Component Value Date/Time   HGBA1C 6.3 (A) 11/25/2020 09:09 AM   HGBA1C 6.0 (A) 07/26/2020 08:15 AM   HGBA1C 6.7 (H) 04/26/2020 08:51 AM   HGBA1C 6.3 (H) 01/26/2020 09:44 AM   GFR 61.26 04/26/2020 08:51 AM   GFR 77.68 04/21/2019 09:52 AM   MICROALBUR 6.2 (H) 03/30/2016 08:26 AM   MICROALBUR 5.5 (H) 03/11/2015 08:05 AM    Last diabetic Eye exam:  Lab Results  Component Value Date/Time   HMDIABEYEEXA No Retinopathy 01/06/2021 12:00 AM    Last diabetic Foot exam:  Lab Results  Component Value Date/Time   HMDIABFOOTEX normal 08/06/2014 12:00 AM     Lab Results  Component Value Date   CHOL 106 04/26/2020   HDL 36.70 (L) 04/26/2020   LDLCALC 56 04/26/2020   TRIG 66.0 04/26/2020   CHOLHDL 3 04/26/2020    Hepatic Function Latest Ref Rng & Units 04/26/2020 04/21/2019 04/18/2018  Total Protein 6.0 - 8.3 g/dL 6.4 6.9 6.5  Albumin 3.5 - 5.2 g/dL 4.1 4.3 3.9  AST 0 - 37 U/L _0 ALT 0 - 53 U/L _1 Alk Phosphatase 39 - 117 U/L 51 66 55  Total Bilirubin 0.2 - 1.2 mg/dL 0.6 0.7 0.4  Bilirubin, Direct 0.0 - 0.3 mg/dL - 0.2 0.1    Lab Results  Component Value Date/Time   TSH 0.74 04/21/2019 09:52 AM   TSH 0.61 04/18/2018 08:48 AM    CBC Latest Ref Rng & Units 04/26/2020 01/26/2020 09/12/2019  WBC 4.0 - 10.5 K/uL  7.4 5.2  °Hemoglobin 13.0 - 17.0 g/dL 11.5(L) 11.8(L) 11.5(A)  °Hematocrit 39.0 - 52.0 % 34.3(L) 35.7(L) 33(A)  °Platelets 150.0 - 400.0 K/uL 287.0 287 -  ° ° °Lab Results  °Component Value Date/Time  ° VD25OH 35 01/26/2020 09:44 AM  ° VD25OH 67.51 07/21/2019 09:44 AM  ° VD25OH 16.77 (L) 04/21/2019 09:52 AM  ° ° °Clinical ASCVD: No  °The ASCVD Risk score (Arnett DK, et al., 2019) failed to calculate for the following reasons: °  The 2019 ASCVD risk score is only valid for ages 40 to 79   ° °Depression screen PHQ 2/9 01/27/2021 01/26/2020 04/21/2019  °Decreased Interest 0 0 0  °Down, Depressed, Hopeless 0 0 0  °PHQ - 2 Score 0 0 0  °Altered sleeping - 0 0  °Tired,  decreased energy - 0 0  °Change in appetite - 0 0  °Feeling bad or failure about yourself  - 0 0  °Trouble concentrating - 0 0  °Moving slowly or fidgety/restless - 0 0  °Suicidal thoughts - 0 0  °PHQ-9 Score - 0 0  °Difficult doing work/chores - Not difficult at all Not difficult at all  °  ° ° °Social History  ° °Tobacco Use  °Smoking Status Never  °Smokeless Tobacco Never  ° °BP Readings from Last 3 Encounters:  °11/25/20 130/70  °07/26/20 124/60  °04/26/20 116/68  ° °Pulse Readings from Last 3 Encounters:  °11/25/20 65  °07/26/20 74  °04/26/20 63  ° °Wt Readings from Last 3 Encounters:  °01/27/21 168 lb (76.2 kg)  °11/25/20 168 lb 4.8 oz (76.3 kg)  °07/26/20 166 lb 9.6 oz (75.6 kg)  ° ° °Assessment/Interventions: Review of patient past medical history, allergies, medications, health status, including review of consultants reports, laboratory and other test data, was performed as part of comprehensive evaluation and provision of chronic care management services.  ° °SDOH:  (Social Determinants of Health) assessments and interventions performed: No °  ° °CCM Care Plan ° °No Known Allergies ° °Medications Reviewed Today   ° ° Reviewed by Pryor, Madeline G, RPH (Pharmacist) on 02/25/21 at 1026  Med List Status: <None>  ° °Medication Order Taking? Sig Documenting Provider Last Dose Status Informant  °ACCU-CHEK GUIDE test strip [id number]  USE AS DIRECTED UP TO 4 TIMES A DAY AS DIRECTED TO CHECK BLOOD SUGAR Burchette, Bruce W, MD  Active   °atorvastatin (LIPITOR) 40 MG tablet [id number] No TAKE 1 TABLET BY MOUTH EVERY DAY Burchette, Bruce W, MD Taking Active   °B-D TB SYRINGE 1CC/27GX1/2" 27G X 1/2" 1 ML MISC [id number] No USE AS DIRECTED WITH METHOTREXATE [provider] Taking Active   °betamethasone dipropionate 0.05 % cream [id number] No Apply 1 application topically 2 (two) times daily. [provider] Taking Active   °blood glucose meter kit and supplies [id number]  Dispense based on patient and  insurance preference. Use up to four times daily as directed. (FOR ICD-10 E10.9, E11.9). Burchette, Bruce W, MD  Active   °clindamycin (CLINDAGEL) 1 % gel [id number] No Apply 1 application topically as needed. [provider] Taking Active   °diclofenac Sodium (VOLTAREN) 1 % GEL [id number] No SMARTSIG:Gram(s) Topical Twice Daily PRN [provider] Taking Active   °fluticasone (FLONASE) 50 MCG/ACT nasal spray [id number] No INSERT 2 SPRAYS IN EACH NOSTRIL EVERY DAY Burchette, Bruce W, MD Taking Active   °folic acid (FOLVITE) 1 MG tablet [id number] No Take 1 mg by mouth daily. [provider] Taking Active   °hydrOXYzine (  hydrOXYzine (ATARAX/VISTARIL) 25 MG tablet [id number] No Take 1 tablet (25 mg total) by mouth every 8 (eight) hours as needed. Eulas Post, MD Taking Active   latanoprost (XALATAN) 0.005 % ophthalmic solution [id number] No PLACE 1 DROP IN BOTH EYES AT BEDTIME [provider] Taking Active            Med Note Carmell Austria, CARMEN E   Mon Nov 12, 2014  8:39 AM) Received from: External Pharmacy  losartan (COZAAR) 50 MG tablet [id number] No TAKE 1 TABLET (50 MG TOTAL) BY MOUTH DAILY. Eulas Post, MD Taking Active   metFORMIN (GLUCOPHAGE) 850 MG tablet [id number]  TAKE 1 TABLET BY MOUTH TWICE A DAY W/ MEAL Burchette, Alinda Sierras, MD  Active   methotrexate 50 MG/2ML injection [id number] No INJECT 0.8ML WEEKLY [provider] Taking Active   OneTouch Delica Lancets 09X MISC [id number] No Use as instructed to check blood sugars 2 times daily. Dx Code E11.9 Eulas Post, MD Taking Active   timolol (TIMOPTIC) 0.5 % ophthalmic solution [id number] No Place 1 drop into both eyes 2 (two) times daily. [provider] Taking Active   triamcinolone cream (KENALOG) 0.1 % [id number]  APPLY 1 APPLICATION 2 TIMES DAILY Burchette, Alinda Sierras, MD  Active             Patient Active Problem List   Diagnosis Date Noted   HTN (hypertension) 04/26/2020   Right inguinal  hernia 04/26/2020   GERD (gastroesophageal reflux disease) 11/10/2019   Glaucoma 11/10/2019   Rheumatoid arthritis ([place]) 08/12/2018   Right rotator cuff tear 53/29/9242   Uncomplicated type 2 diabetes mellitus ([place]) 08/28/2013   Type 2 diabetes mellitus ([place]) 08/07/2011   LIVER FUNCTION TESTS, ABNORMAL, HX OF 12/02/2009   Vitamin D deficiency 01/10/2008   Hyperlipidemia 01/06/2007   ERECTILE DYSFUNCTION 01/06/2007   HYPERTENSION, BENIGN ESSENTIAL 01/06/2007   ARTHRITIS 01/06/2007    Immunization History  Administered Date(s) Administered   Fluad Quad(high Dose 65+) 10/27/2018, 12/22/2018   Influenza Split 11/20/2010, 11/13/2011   Influenza Whole 12/31/2006, 11/22/2008, 11/14/2009   Influenza, High Dose Seasonal PF 11/23/2013, 11/12/2014, 12/09/2015, 12/11/2016, 11/19/2017   Influenza,inj,Quad PF,6+ Mos 10/31/2012   Influenza-Unspecified 11/23/2019   Moderna SARS-COV2 Booster Vaccination 12/18/2019, 06/17/2020   Moderna Sars-Covid-2 Vaccination 03/06/2019, 04/03/2019   Pneumococcal Conjugate-13 05/01/2013   Pneumococcal Polysaccharide-23 03/01/2003   Tdap 04/17/2010   Zoster Recombinat (Shingrix) 09/11/2016, 11/30/2016   Zoster, Live 03/06/2011   Patient reports he is still bothered by his rash and thinks his medications could be causing it. He is unsure of how to proceed.  Conditions to be addressed/monitored:  Hypertension, Hyperlipidemia, Diabetes and vitamin D deficiency, RA, and pruritis  Conditions addressed this visit: Hypertension, diabetes  Care Plan : CCM Pharmacy Care Plan  Updates made by Viona Gilmore, [place] since 02/25/2021 12:00 AM     Problem: Problem: Hypertension, Hyperlipidemia, Diabetes and vitamin D deficiency, RA, and pruritis      Long-Range Goal: Patient-Specific Goal   Start Date: 04/26/2020  Expected End Date: 04/26/2021  Recent Progress: On track  Priority: High  Note:   Current Barriers:  Unable to independently monitor therapeutic  efficacy  Pharmacist Clinical Goal(s):  patient will achieve adherence to monitoring guidelines and medication adherence to achieve therapeutic efficacy through collaboration with PharmD and provider.   Interventions: 1:1 collaboration with Eulas Post, MD regarding development and update of comprehensive plan of care as evidenced by provider attestation and co-signature Inter-disciplinary care team collaboration (see  plan of care) °Comprehensive medication review performed; medication list updated in electronic medical record ° °Hypertension (BP goal <140/90) °-Controlled °-Current treatment: °Losartan 50 mg 1 tablet daily - appropriate, effective, safe, accessible °-Medications previously tried: quinapril-HCTZ  °-Current home readings: 120-130/80-90 (checking twice a week) °-Current dietary habits: limits salt intake and recommended lite salt to further decrease °-Current exercise habits: walking for about 30 minutes per day °-Denies hypotensive/hypertensive symptoms °-Educated on Exercise goal of 150 minutes per week; °Importance of home blood pressure monitoring; °-Counseled to monitor BP at home weekly, document, and provide log at future appointments °-Counseled on diet and exercise extensively °Recommended to continue current medication °Counseled on importance of home blood pressure monitoring ° °Hyperlipidemia: (LDL goal < 100) °-Controlled °-Current treatment: °Atorvastatin 40 mg 1 tablet daily - appropriate, effective, safe, accessible °-Medications previously tried: none  °-Current dietary patterns: did not dicuss °-Current exercise habits: walking daily °-Educated on Cholesterol goals;  °Importance of limiting foods high in cholesterol; °Exercise goal of 150 minutes per week; °-Recommended to continue current medication ° °Diabetes (A1c goal <7%) °-Controlled °-Current medications: °Metformin 850 mg 1 tablet twice daily - appropriate, effective, safe, accessible °-Medications  previously tried: none  °-Current home glucose readings (2-3 times a week) °fasting glucose: 116 (usually below 130) °post prandial glucose: n/a °-Denies hypoglycemic/hyperglycemic symptoms °-Current meal patterns:  °breakfast: n/a  °lunch: n/a  °dinner: n/a °snacks: limiting ice cream °drinks: n/a °-Current exercise: walking daily °-Educated on Exercise goal of 150 minutes per week; °Carbohydrate counting and/or plate method °-Counseled to check feet daily and get yearly eye exams °-Recommended to continue current medication ° °Vitamin D deficiency (Goal: 30-100) °-Controlled °-Current treatment  °Vitamin D 1000 units daily - appropriate, effective, safe, accessible °-Medications previously tried: none  °-Recommended to continue current medication ° °Rheumatoid arthritis (Goal: prevent flare ups and minimize symptoms) °-Controlled °-Current treatment  °Methotrexate 50 mg/2mL inject 0.8 ml weekly - appropriate, effective, safe, accessible °Folic acid 1 mg 1 tablet daily - appropriate, effective, safe, accessible °-Medications previously tried: none  °-Recommended to continue current medication ° °Anemia (Goal: HgB > 11) °-Uncontrolled °-Current treatment  °No medications °-Medications previously tried: ferrous sulfate (completed)  °-Counseled on diet and exercise extensively ° °Glaucoma (Goal: lower intraocular pressure) °-Controlled °-Current treatment  °Latanoprost 1 drop in both eyes at bedtime - appropriate, effective, safe, accessible °Timolol 0.5% 1 drop in both eyes twice daily - appropriate, effective, safe, accessible °-Medications previously tried: none  °-Recommended to continue current medication ° °Topicals/pruritis (Goal: minimize symptoms) °-Controlled °-Current treatment  °Betamethasone cream 0.05% apply twice daily °Clindamycin 1% gel daily °Lidocaine-hydrocortisone 3-0.5% cream °Triamcinolone 0.1% cream apply twice daily °-Medications previously tried: none  °-Recommended to continue current  medication °Educated on possible reasons for a rash from other medications and can consider a trial without some of them to see if this is causing it but patient preferred to hold off for now ° ° °Health Maintenance °-Vaccine gaps: tetanus °-Current therapy:  °Diclofenac gel 1% as needed °Flonase nasal spray as needed °-Educated on Cost vs benefit of each product must be carefully weighed by individual consumer °-Patient is satisfied with current therapy and denies issues °-Recommended to continue current medication ° °Patient Goals/Self-Care Activities °patient will:  °- take medications as prescribed °check glucose 2-3 times a week at different times, document, and provide at future appointments °check blood pressure weekly, document, and provide at future appointments ° °Follow Up Plan: Telephone follow up appointment with care management team member   member scheduled for: 1 year      Medication Assistance: None required.  Patient affirms current coverage meets needs.  Compliance/Adherence/Medication fill history: Care Gaps: Tetanus, COVID booster Last  BP - 130/70 on 11/25/2020  Last A1C - 6.3 on 11/25/2020   Star-Rating Drugs: Atorvastatin 58m - last filled on 12/17/2020 [facility] Losartan 526m- last filled on 12/16/2020 [facility] Metformin 85040m last filled on 12/17/2020 [facility]  [facility]  [id number] [place] [id number]: [phone]: [phone] pill box? Yes Pt endorses 100% compliance  We discussed: Benefits of medication synchronization, packaging and delivery as well as enhanced pharmacist oversight with [employer]. Patient decided to: Continue current medication management strategy  Care Plan and Follow Up Patient Decision:  Patient agrees to Care Plan and Follow-up.  Plan: Telephone follow up appointment with care management  team member scheduled for:  1 year  MadeJeni SallesarmD BCACEllavillermacist LeBaOccidental PetroleumBrasPatterson-[phone]

## 2021-03-15 ENCOUNTER — Other Ambulatory Visit: Payer: Self-pay | Admitting: Family Medicine

## 2021-03-25 DIAGNOSIS — I10 Essential (primary) hypertension: Secondary | ICD-10-CM | POA: Diagnosis not present

## 2021-03-25 DIAGNOSIS — E119 Type 2 diabetes mellitus without complications: Secondary | ICD-10-CM

## 2021-03-28 ENCOUNTER — Ambulatory Visit: Payer: Medicare Other | Admitting: Family Medicine

## 2021-04-02 ENCOUNTER — Ambulatory Visit (INDEPENDENT_AMBULATORY_CARE_PROVIDER_SITE_OTHER): Payer: Medicare Other | Admitting: Family Medicine

## 2021-04-02 VITALS — BP 132/60 | HR 70 | Temp 97.9°F | Wt 171.1 lb

## 2021-04-02 DIAGNOSIS — E119 Type 2 diabetes mellitus without complications: Secondary | ICD-10-CM

## 2021-04-02 DIAGNOSIS — M542 Cervicalgia: Secondary | ICD-10-CM

## 2021-04-02 DIAGNOSIS — R519 Headache, unspecified: Secondary | ICD-10-CM

## 2021-04-02 LAB — POCT GLYCOSYLATED HEMOGLOBIN (HGB A1C): Hemoglobin A1C: 6.8 % — AB (ref 4.0–5.6)

## 2021-04-02 LAB — SEDIMENTATION RATE: Sed Rate: 22 mm/hr — ABNORMAL HIGH (ref 0–20)

## 2021-04-02 NOTE — Progress Notes (Signed)
Established Patient Office Visit  Subjective:  Patient ID: Kevin Wise, male    DOB: 01-Mar-1935  Age: 86 y.o. MRN: [id number]  CC:  Chief Complaint  Patient presents with   Headache    Nagging ache that starts in the right temple and radiates down the neck, comes and goes, x 2 weeks    HPI Kevin Wise presents for the following items  He initially complained of a achy sensation right temporal region but this is really more just above the ear.  He initially described his headache but states this is really more of a soreness locally and this radiates anterior to the right ear and down toward the neck region.  Has not noted any posterior cervical neck pain.  No injury.  No visual changes.  No nausea or vomiting.  Pain is 5 out of 10 at its worst.  Sometimes worse with rotating head to the left side.  No history of similar pain previously.  No skin rashes.  No facial weakness.  Denies any extremity weakness.  No speech change.  Type 2 diabetes.  A1c's have been consistently well controlled.  No recent polyuria or polydipsia.  Remains on metformin.  He does have history of rheumatoid arthritis is followed by rheumatology and is on methotrexate  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   ARTHRITIS 01/06/2007   CAROTID ARTERY STENOSIS, RIGHT 01/10/2009   Diabetes mellitus without complication ([place])    ERECTILE DYSFUNCTION 01/06/2007   HYPERGLYCEMIA, BORDERLINE 12/02/2009   HYPERLIPIDEMIA 01/06/2007   HYPERTENSION, BENIGN ESSENTIAL 01/06/2007   HYPOTHYROIDISM 01/10/2008   LIVER FUNCTION TESTS, ABNORMAL, HX OF 12/02/2009   Other symptoms involving cardiovascular system 01/10/2009   Rosacea 01/06/2007   UNS ADVRS EFF OTH RX MEDICINAL&BIOLOGICAL SBSTNC 01/10/2008   VITAMIN D DEFICIENCY 01/10/2008    Past Surgical History:  Procedure Laterality Date   HERNIA REPAIR     bilateral inguinal    TONSILLECTOMY      Family History  Problem Relation Age of Onset    Hyperlipidemia Mother    Diabetes Mother     Social History   Socioeconomic History   Marital status: Married    Spouse name: Not on file   Number of children: Not on file   Years of education: Not on file   Highest education level: Not on file  Occupational History   Not on file  Tobacco Use   Smoking status: Never   Smokeless tobacco: Never  Vaping Use   Vaping Use: Never used  Substance and Sexual Activity   Alcohol use: Not on file   Drug use: No   Sexual activity: Not on file  Other Topics Concern   Not on file  Social History Narrative   Not on file   Social Determinants of Health   Financial Resource Strain: Low Risk    Difficulty of Paying Living Expenses: Not hard at all  Food Insecurity: No Food Insecurity   Worried About Charity fundraiser in the Last Year: Never true   Arboriculturist in the Last Year: Never true  Transportation Needs: No Transportation Needs   Lack of Transportation (Medical): No   Lack of Transportation (Non-Medical): No  Physical Activity: Insufficiently Active   Days of Exercise per Week: 3 days   Minutes of Exercise per Session: 40 min  Stress: No Stress Concern Present   Feeling of Stress : Not at all  Social Connections: Socially Integrated  Frequency of Communication with Friends and Family: More than three times a week   Frequency of Social Gatherings with Friends and Family: More than three times a week   Attends Religious Services: More than 4 times per year   Active Member of [employer] or Organizations: Yes   Attends Music therapist: More than 4 times per year   Marital Status: Married  Human resources officer Violence: Not At Risk   Fear of Current or Ex-Partner: No   Emotionally Abused: No   Physically Abused: No   Sexually Abused: No    Outpatient Medications Prior to Visit  Medication Sig Dispense Refill   ACCU-CHEK GUIDE test strip USE AS DIRECTED UP TO 4 TIMES A DAY AS DIRECTED TO CHECK BLOOD SUGAR 100  strip 3   atorvastatin (LIPITOR) 40 MG tablet TAKE 1 TABLET BY MOUTH EVERY DAY 90 tablet 3   B-D TB SYRINGE 1CC/27GX1/2" 27G X 1/2" 1 ML MISC USE AS DIRECTED WITH METHOTREXATE     betamethasone dipropionate 0.05 % cream Apply 1 application topically 2 (two) times daily.     blood glucose meter kit and supplies Dispense based on patient and insurance preference. Use up to four times daily as directed. (FOR ICD-10 E10.9, E11.9). 1 each 0   clindamycin (CLINDAGEL) 1 % gel Apply 1 application topically as needed.     diclofenac Sodium (VOLTAREN) 1 % GEL SMARTSIG:Gram(s) Topical Twice Daily PRN     fluticasone (FLONASE) 50 MCG/ACT nasal spray INSERT 2 SPRAYS IN EACH NOSTRIL EVERY DAY 48 g 1   folic acid (FOLVITE) 1 MG tablet Take 1 mg by mouth daily.     hydrOXYzine (ATARAX/VISTARIL) 25 MG tablet Take 1 tablet (25 mg total) by mouth every 8 (eight) hours as needed. 90 tablet 3   latanoprost (XALATAN) 0.005 % ophthalmic solution PLACE 1 DROP IN BOTH EYES AT BEDTIME  3   losartan (COZAAR) 50 MG tablet TAKE 1 TABLET BY MOUTH EVERY DAY 90 tablet 1   metFORMIN (GLUCOPHAGE) 850 MG tablet TAKE 1 TABLET BY MOUTH TWICE A DAY W/ MEAL 180 tablet 0   methotrexate 50 MG/2ML injection INJECT 0.8ML WEEKLY     OneTouch Delica Lancets 70V MISC Use as instructed to check blood sugars 2 times daily. Dx Code E11.9 200 each 3   timolol (TIMOPTIC) 0.5 % ophthalmic solution Place 1 drop into both eyes 2 (two) times daily.     triamcinolone cream (KENALOG) 0.1 % APPLY 1 APPLICATION 2 TIMES DAILY 454 g 1   No facility-administered medications prior to visit.    No Known Allergies  ROS Review of Systems  Constitutional:  Negative for chills and fever.  Eyes:  Negative for visual disturbance.  Respiratory:  Negative for cough and shortness of breath.   Cardiovascular:  Negative for chest pain.  Skin:  Negative for rash.  Neurological:  Negative for seizures, syncope, facial asymmetry, speech difficulty and weakness.   Psychiatric/Behavioral:  Negative for confusion.      Objective:    Physical Exam Vitals reviewed.  Constitutional:      Appearance: He is well-developed.  HENT:     Head:     Comments: No temporal artery tenderness.  No visible swelling above the right ear region.  Right external ear appears normal. Cardiovascular:     Rate and Rhythm: Normal rate and regular rhythm.  Pulmonary:     Effort: Pulmonary effort is normal.     Breath sounds: Normal breath sounds.  Musculoskeletal:  Cervical back: Neck supple.  Lymphadenopathy:     Cervical: No cervical adenopathy.  Skin:    Findings: No rash.  Neurological:     Mental Status: He is alert.     Cranial Nerves: No cranial nerve deficit or facial asymmetry.    BP 132/60 (BP Location: Left Arm, Patient Position: Sitting, Cuff Size: Normal)    Pulse 70    Temp 97.9 F (36.6 C) (Oral)    Wt 171 lb 1.6 oz (77.6 kg)    SpO2 99%    BMI 26.80 kg/m  Wt Readings from Last 3 Encounters:  04/02/21 171 lb 1.6 oz (77.6 kg)  01/27/21 168 lb (76.2 kg)  11/25/20 168 lb 4.8 oz (76.3 kg)     Health Maintenance Due  Topic Date Due   COVID-19 Vaccine (3 - Moderna risk series) 07/15/2020    There are no preventive care reminders to display for this patient.  Lab Results  Component Value Date   TSH 0.74 04/21/2019   Lab Results  Component Value Date   WBC 5.8 04/26/2020   HGB 11.5 (L) 04/26/2020   HCT 34.3 (L) 04/26/2020   MCV 96.4 04/26/2020   PLT 287.0 04/26/2020   Lab Results  Component Value Date   NA 142 04/26/2020   K 4.1 04/26/2020   CO2 29 04/26/2020   GLUCOSE 97 04/26/2020   BUN 15 04/26/2020   CREATININE 1.12 04/26/2020   BILITOT 0.6 04/26/2020   ALKPHOS 51 04/26/2020   AST 20 04/26/2020   ALT 25 04/26/2020   PROT 6.4 04/26/2020   ALBUMIN 4.1 04/26/2020   CALCIUM 9.2 04/26/2020   GFR 61.26 04/26/2020   Lab Results  Component Value Date   CHOL 106 04/26/2020   Lab Results  Component Value Date   HDL  36.70 (L) 04/26/2020   Lab Results  Component Value Date   LDLCALC 56 04/26/2020   Lab Results  Component Value Date   TRIG 66.0 04/26/2020   Lab Results  Component Value Date   CHOLHDL 3 04/26/2020   Lab Results  Component Value Date   HGBA1C 6.8 (A) 04/02/2021      Assessment & Plan:   Problem List Items Addressed This Visit       Unprioritized   Type 2 diabetes mellitus ([place])   Relevant Orders   POCT glycosylated hemoglobin (Hb A1C) (Completed)   Other Visit Diagnoses     Unilateral headache    -  Primary   Relevant Orders   Sedimentation rate     Patient presents with 2-week history of some soreness and achiness just above the right ear.  This is really not over the temporal artery and he denies any visual changes.  No facial rash.  Etiology unclear.  Nonfocal neuro exam.  Symptoms relatively mild.  -Will check sed rate -Follow-up immediately for any visual changes, slurred speech, focal weakness, recurrent vomiting, progressive headache, or other concerns  His A1c is 6.8%.  This is however generally in the past between 6 and 6.8.  He thinks some of this reflects more liberal eating over the holidays.  Recheck in 3 months.  No medication changes made today  No orders of the defined types were placed in this encounter.   Follow-up: Return in about 3 months (around 06/30/2021).    Carolann Littler, MD

## 2021-04-02 NOTE — Patient Instructions (Signed)
Follow up immediately for any blurred vision, progressive headache, exertional headache,  recurrent vomiting or any new neurologic symptoms

## 2021-04-03 ENCOUNTER — Telehealth: Payer: Self-pay | Admitting: Family Medicine

## 2021-04-03 NOTE — Telephone Encounter (Signed)
Labs have not been reviewed by Dr. Elease Hashimoto yet. Will call the patient once Dr. Elease Hashimoto has sent this to me.

## 2021-04-03 NOTE — Telephone Encounter (Signed)
Pt call and stated he is call back about his labs that were done yesterday and want a call back.

## 2021-04-04 NOTE — Addendum Note (Signed)
Addended by: Kristian Covey on: 04/04/2021 12:40 PM   Modules accepted: Orders

## 2021-04-07 ENCOUNTER — Other Ambulatory Visit: Payer: Medicare Other

## 2021-04-08 ENCOUNTER — Other Ambulatory Visit: Payer: Medicare Other

## 2021-04-11 ENCOUNTER — Other Ambulatory Visit: Payer: Medicare Other

## 2021-04-11 ENCOUNTER — Ambulatory Visit (INDEPENDENT_AMBULATORY_CARE_PROVIDER_SITE_OTHER): Payer: Medicare Other

## 2021-04-11 ENCOUNTER — Other Ambulatory Visit: Payer: Self-pay

## 2021-04-11 DIAGNOSIS — M542 Cervicalgia: Secondary | ICD-10-CM | POA: Diagnosis not present

## 2021-04-28 ENCOUNTER — Emergency Department (HOSPITAL_BASED_OUTPATIENT_CLINIC_OR_DEPARTMENT_OTHER)
Admission: EM | Admit: 2021-04-28 | Discharge: 2021-04-29 | Disposition: A | Discharge status: Home / Self Care | Payer: Medicare Other | Attending: Emergency Medicine | Admitting: Emergency Medicine

## 2021-04-28 ENCOUNTER — Other Ambulatory Visit: Payer: Self-pay

## 2021-04-28 ENCOUNTER — Ambulatory Visit (INDEPENDENT_AMBULATORY_CARE_PROVIDER_SITE_OTHER): Payer: Medicare Other | Admitting: Family Medicine

## 2021-04-28 VITALS — BP 160/90 | HR 65 | Temp 98.3°F | Ht 66.14 in | Wt 168.5 lb

## 2021-04-28 DIAGNOSIS — Z0000 Encounter for general adult medical examination without abnormal findings: Secondary | ICD-10-CM | POA: Diagnosis not present

## 2021-04-28 DIAGNOSIS — E039 Hypothyroidism, unspecified: Secondary | ICD-10-CM

## 2021-04-28 DIAGNOSIS — Z79899 Other long term (current) drug therapy: Secondary | ICD-10-CM | POA: Diagnosis not present

## 2021-04-28 DIAGNOSIS — I10 Essential (primary) hypertension: Secondary | ICD-10-CM

## 2021-04-28 LAB — LIPID PANEL
Cholesterol: 116 mg/dL (ref 0–200)
HDL: 39.2 mg/dL (ref >39.00)
LDL Cholesterol: 55 mg/dL (ref 0–99)
NonHDL: 76.51
Total CHOL/HDL Ratio: 3
Triglycerides: 109 mg/dL (ref 0.0–149.0)
VLDL: 21.8 mg/dL (ref 0.0–40.0)

## 2021-04-28 LAB — BASIC METABOLIC PANEL
BUN: 17 mg/dL (ref 6–23)
CO2: 29 mEq/L (ref 19–32)
Calcium: 9.4 mg/dL (ref 8.4–10.5)
Chloride: 103 mEq/L (ref 96–112)
Creatinine, Ser: 1.18 mg/dL (ref 0.40–1.50)
GFR: 57.14 mL/min — ABNORMAL LOW (ref >60.00)
Glucose, Bld: 92 mg/dL (ref 70–99)
Potassium: 4 mEq/L (ref 3.5–5.1)
Sodium: 141 mEq/L (ref 135–145)

## 2021-04-28 LAB — CBC WITH DIFFERENTIAL/PLATELET
Basophils Absolute: 0.1 10*3/uL (ref 0.0–0.1)
Basophils Relative: 0.8 % (ref 0.0–3.0)
Eosinophils Absolute: 0.3 10*3/uL (ref 0.0–0.7)
Eosinophils Relative: 4.2 % (ref 0.0–5.0)
HCT: 36.2 % — ABNORMAL LOW (ref 39.0–52.0)
Hemoglobin: 12 g/dL — ABNORMAL LOW (ref 13.0–17.0)
Lymphocytes Relative: 25.9 % (ref 12.0–46.0)
Lymphs Abs: 1.7 10*3/uL (ref 0.7–4.0)
MCHC: 33 g/dL (ref 30.0–36.0)
MCV: 96.5 fl (ref 78.0–100.0)
Monocytes Absolute: 0.6 10*3/uL (ref 0.1–1.0)
Monocytes Relative: 9.6 % (ref 3.0–12.0)
Neutro Abs: 4 10*3/uL (ref 1.4–7.7)
Neutrophils Relative %: 59.5 % (ref 43.0–77.0)
Platelets: 291 10*3/uL (ref 150.0–400.0)
RBC: 3.75 Mil/uL — ABNORMAL LOW (ref 4.22–5.81)
RDW: 14.5 % (ref 11.5–15.5)
WBC: 6.7 10*3/uL (ref 4.0–10.5)

## 2021-04-28 LAB — TSH: TSH: 1.31 u[IU]/mL (ref 0.35–5.50)

## 2021-04-28 LAB — HEPATIC FUNCTION PANEL
ALT: 25 U/L (ref 0–53)
AST: 25 U/L (ref 0–37)
Albumin: 4.6 g/dL (ref 3.5–5.2)
Alkaline Phosphatase: 55 U/L (ref 39–117)
Bilirubin, Direct: 0.1 mg/dL (ref 0.0–0.3)
Total Bilirubin: 0.7 mg/dL (ref 0.2–1.2)
Total Protein: 6.9 g/dL (ref 6.0–8.3)

## 2021-04-28 NOTE — Progress Notes (Signed)
Established Patient Office Visit  Subjective:  Patient ID: Kevin Wise, male    DOB: Nov 24, 1935  Age: 86 y.o. MRN: [id number]  CC:  Chief Complaint  Patient presents with   Annual Exam    HPI Kevin Wise presents for annual physical exam.  He has history of hypertension, type 2 diabetes, rheumatoid arthritis, hyperlipidemia, glaucoma.  He is followed by rheumatology.  Has had some recent somewhat bilateral cervical neck pains.  Plain x-rays show degenerative arthritis but not severe.  No acute bony abnormalities.  No recent radiculitis symptoms.  Blood pressure is up this morning which is atypical for him.  He takes losartan 50 mg daily.  He took his medication this morning.  Has had some higher sodium intake over the weekend with things like meat loaf and some canned vegetables.  He does eat some salted snacks such as salted almonds.  A1c has been well controlled.  Health maintenance reviewed  -Did receive flu vaccine last fall -Pneumonia vaccines complete -Shingrix complete -Gets yearly diabetic eye exams  Family history-mother had type 2 diabetes and hyperlipidemia  Social history-he is married.  Never smoked.  Only occasional beer.  Has been retired for several years  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   ARTHRITIS 01/06/2007   CAROTID ARTERY STENOSIS, RIGHT 01/10/2009   Diabetes mellitus without complication ([place])    ERECTILE DYSFUNCTION 01/06/2007   HYPERGLYCEMIA, BORDERLINE 12/02/2009   HYPERLIPIDEMIA 01/06/2007   HYPERTENSION, BENIGN ESSENTIAL 01/06/2007   HYPOTHYROIDISM 01/10/2008   LIVER FUNCTION TESTS, ABNORMAL, HX OF 12/02/2009   Other symptoms involving cardiovascular system 01/10/2009   Rosacea 01/06/2007   UNS ADVRS EFF OTH RX MEDICINAL&BIOLOGICAL SBSTNC 01/10/2008   VITAMIN D DEFICIENCY 01/10/2008    Past Surgical History:  Procedure Laterality Date   HERNIA REPAIR     bilateral inguinal    TONSILLECTOMY      Family  History  Problem Relation Age of Onset   Hyperlipidemia Mother    Diabetes Mother     Social History   Socioeconomic History   Marital status: Married    Spouse name: Not on file   Number of children: Not on file   Years of education: Not on file   Highest education level: Not on file  Occupational History   Not on file  Tobacco Use   Smoking status: Never   Smokeless tobacco: Never  Vaping Use   Vaping Use: Never used  Substance and Sexual Activity   Alcohol use: Not on file   Drug use: No   Sexual activity: Not on file  Other Topics Concern   Not on file  Social History Narrative   Not on file   Social Determinants of Health   Financial Resource Strain: Low Risk    Difficulty of Paying Living Expenses: Not hard at all  Food Insecurity: No Food Insecurity   Worried About Charity fundraiser in the Last Year: Never true   Arboriculturist in the Last Year: Never true  Transportation Needs: No Transportation Needs   Lack of Transportation (Medical): No   Lack of Transportation (Non-Medical): No  Physical Activity: Insufficiently Active   Days of Exercise per Week: 3 days   Minutes of Exercise per Session: 40 min  Stress: No Stress Concern Present   Feeling of Stress : Not at all  Social Connections: Socially Integrated   Frequency of Communication with Friends and Family: More than three times a  week   Frequency of Social Gatherings with Friends and Family: More than three times a week   Attends Religious Services: More than 4 times per year   Active Member of [employer] or Organizations: Yes   Attends Music therapist: More than 4 times per year   Marital Status: Married  Human resources officer Violence: Not At Risk   Fear of Current or Ex-Partner: No   Emotionally Abused: No   Physically Abused: No   Sexually Abused: No    Outpatient Medications Prior to Visit  Medication Sig Dispense Refill   ACCU-CHEK GUIDE test strip USE AS DIRECTED UP TO 4 TIMES A  DAY AS DIRECTED TO CHECK BLOOD SUGAR 100 strip 3   atorvastatin (LIPITOR) 40 MG tablet TAKE 1 TABLET BY MOUTH EVERY DAY 90 tablet 3   B-D TB SYRINGE 1CC/27GX1/2" 27G X 1/2" 1 ML MISC USE AS DIRECTED WITH METHOTREXATE     betamethasone dipropionate 0.05 % cream Apply 1 application topically 2 (two) times daily.     blood glucose meter kit and supplies Dispense based on patient and insurance preference. Use up to four times daily as directed. (FOR ICD-10 E10.9, E11.9). 1 each 0   clindamycin (CLINDAGEL) 1 % gel Apply 1 application topically as needed.     diclofenac Sodium (VOLTAREN) 1 % GEL SMARTSIG:Gram(s) Topical Twice Daily PRN     fluticasone (FLONASE) 50 MCG/ACT nasal spray INSERT 2 SPRAYS IN EACH NOSTRIL EVERY DAY 48 g 1   folic acid (FOLVITE) 1 MG tablet Take 1 mg by mouth daily.     gabapentin (NEURONTIN) 100 MG capsule      hydrOXYzine (ATARAX/VISTARIL) 25 MG tablet Take 1 tablet (25 mg total) by mouth every 8 (eight) hours as needed. 90 tablet 3   latanoprost (XALATAN) 0.005 % ophthalmic solution PLACE 1 DROP IN BOTH EYES AT BEDTIME  3   losartan (COZAAR) 50 MG tablet TAKE 1 TABLET BY MOUTH EVERY DAY 90 tablet 1   metFORMIN (GLUCOPHAGE) 850 MG tablet TAKE 1 TABLET BY MOUTH TWICE A DAY W/ MEAL 180 tablet 0   methotrexate 50 MG/2ML injection INJECT 0.8ML WEEKLY     OneTouch Delica Lancets 11N MISC Use as instructed to check blood sugars 2 times daily. Dx Code E11.9 200 each 3   Potassium Gluconate 595 MG TBCR      timolol (TIMOPTIC) 0.5 % ophthalmic solution Place 1 drop into both eyes 2 (two) times daily.     triamcinolone cream (KENALOG) 0.1 % APPLY 1 APPLICATION 2 TIMES DAILY 454 g 1   No facility-administered medications prior to visit.    No Known Allergies  ROS Review of Systems  Constitutional:  Negative for activity change, appetite change, fatigue and fever.  HENT:  Negative for congestion, ear pain and trouble swallowing.   Eyes:  Negative for pain and visual  disturbance.  Respiratory:  Negative for cough, shortness of breath and wheezing.   Cardiovascular:  Negative for chest pain and palpitations.  Gastrointestinal:  Negative for abdominal distention, abdominal pain, blood in stool, constipation, diarrhea, nausea, rectal pain and vomiting.  Genitourinary:  Negative for dysuria, hematuria and testicular pain.  Musculoskeletal:  Positive for arthralgias and neck pain. Negative for joint swelling.  Skin:  Negative for rash.  Neurological:  Negative for dizziness, syncope, weakness and headaches.  Hematological:  Negative for adenopathy.  Psychiatric/Behavioral:  Negative for confusion and dysphoric mood.      Objective:    Physical Exam Constitutional:  General: He is not in acute distress.    Appearance: He is well-developed.  HENT:     Head: Normocephalic and atraumatic.     Right Ear: External ear normal.     Left Ear: External ear normal.  Eyes:     Conjunctiva/sclera: Conjunctivae normal.     Pupils: Pupils are equal, round, and reactive to light.  Neck:     Thyroid: No thyromegaly.  Cardiovascular:     Rate and Rhythm: Normal rate and regular rhythm.     Heart sounds: Normal heart sounds. No murmur heard. Pulmonary:     Effort: No respiratory distress.     Breath sounds: No wheezing or rales.  Abdominal:     General: Bowel sounds are normal. There is no distension.     Palpations: Abdomen is soft. There is no mass.     Tenderness: There is no abdominal tenderness. There is no guarding or rebound.  Musculoskeletal:     Cervical back: Normal range of motion and neck supple.     Right lower leg: No edema.     Left lower leg: No edema.  Lymphadenopathy:     Cervical: No cervical adenopathy.  Skin:    Findings: No rash.     Comments: Feet are warm to touch.  He has good distal pulses.  He does have calluses distal lateral aspect of both feet right greater than left but no ulceration.  Normal sensory function with  monofilament testing.  Good capillary refill.  Neurological:     Mental Status: He is alert and oriented to person, place, and time.     Cranial Nerves: No cranial nerve deficit.     Deep Tendon Reflexes: Reflexes normal.    BP (!) 160/90 (BP Location: Left Arm, Patient Position: Sitting, Cuff Size: Normal)    Pulse 65    Temp 98.3 F (36.8 C) (Oral)    Ht 5' 6.14" (1.68 m)    Wt 168 lb 8 oz (76.4 kg)    SpO2 99%    BMI 27.08 kg/m  Wt Readings from Last 3 Encounters:  04/28/21 168 lb 8 oz (76.4 kg)  04/02/21 171 lb 1.6 oz (77.6 kg)  01/27/21 168 lb (76.2 kg)     Health Maintenance Due  Topic Date Due   COVID-19 Vaccine (3 - Moderna risk series) 07/15/2020    There are no preventive care reminders to display for this patient.  Lab Results  Component Value Date   TSH 0.74 04/21/2019   Lab Results  Component Value Date   WBC 5.8 04/26/2020   HGB 11.5 (L) 04/26/2020   HCT 34.3 (L) 04/26/2020   MCV 96.4 04/26/2020   PLT 287.0 04/26/2020   Lab Results  Component Value Date   NA 142 04/26/2020   K 4.1 04/26/2020   CO2 29 04/26/2020   GLUCOSE 97 04/26/2020   BUN 15 04/26/2020   CREATININE 1.12 04/26/2020   BILITOT 0.6 04/26/2020   ALKPHOS 51 04/26/2020   AST 20 04/26/2020   ALT 25 04/26/2020   PROT 6.4 04/26/2020   ALBUMIN 4.1 04/26/2020   CALCIUM 9.2 04/26/2020   GFR 61.26 04/26/2020   Lab Results  Component Value Date   CHOL 106 04/26/2020   Lab Results  Component Value Date   HDL 36.70 (L) 04/26/2020   Lab Results  Component Value Date   LDLCALC 56 04/26/2020   Lab Results  Component Value Date   TRIG 66.0 04/26/2020   Lab Results  Component Value Date   CHOLHDL 3 04/26/2020   Lab Results  Component Value Date   HGBA1C 6.8 (A) 04/02/2021      Assessment & Plan:   Problem List Items Addressed This Visit   None Visit Diagnoses     Physical exam    -  Primary   Relevant Orders   Basic metabolic panel   Lipid panel   CBC with  Differential/Platelet   TSH   Hepatic function panel     Patient has diabetes type 2 which has been well controlled.  His blood pressure is up today which has been atypical for him.  We repeated blood pressure multiple times right and left arms and obtain consistent readings around 160/90.  We recommend the following  -Obtain screening labs as above -Try to keep daily sodium intake less than 2500 mg -Handout on DASH diet given -Check blood pressure by home readings and if consistently greater than 150/90 double up his losartan 100 mg daily -Set up 1 week follow-up to reassess and bring his cuff to read with ours at that time -Might consider low-dose amlodipine if still up at that point  No orders of the defined types were placed in this encounter.   Follow-up: No follow-ups on file.    Carolann Littler, MD

## 2021-04-28 NOTE — Patient Instructions (Signed)
Monitor blood pressure at home and if consistently > 150/90 go ahead and double up the Losartan to two daily ? ?Bring your cuff to follow up  ? ?Set up one week follow up.   ? ?Keep sodium intake < 2,500 mg /day ?

## 2021-04-29 ENCOUNTER — Telehealth: Payer: Self-pay

## 2021-04-29 ENCOUNTER — Encounter (HOSPITAL_BASED_OUTPATIENT_CLINIC_OR_DEPARTMENT_OTHER): Payer: Self-pay | Admitting: Urology

## 2021-04-29 NOTE — Telephone Encounter (Signed)
I spoke with the pt and he reported BP reading today of 161/98 before medication and reading of 158/90 2 hours after medication. Pt inquired if he should still wait till Friday to come into the office or should  he be seen sooner. Please advise.  ?

## 2021-04-29 NOTE — ED Triage Notes (Signed)
Pt states high blood pressure throughout the day  ?Denies Headache or vision changes  ? ?Highest pressure at home was 188/111 ?Took extra losartan after lunch today  ?

## 2021-04-29 NOTE — Telephone Encounter (Signed)
Pt informed of the message and verbalized understanding  

## 2021-04-29 NOTE — Telephone Encounter (Signed)
Left a message for the pt to return my call.  

## 2021-04-29 NOTE — ED Provider Notes (Signed)
?[facility] ?Provider Note ? ? ?CSN: [id number] ?Arrival date & time: 04/28/21  2353 ? ?  ? ?History ? ?Chief Complaint  ?Patient presents with  ? Hypertension  ? ? ?Kevin Wise is a 86 y.o. male. ? ?The history is provided by the patient and the spouse.  ?Hypertension ?This is a chronic problem. The problem occurs constantly. Pertinent negatives include no chest pain, no abdominal pain, no headaches and no shortness of breath. Nothing aggravates the symptoms.  ?Patient reports his blood pressure has been elevated despite taking his home medications.  He does report increase in sodium recently.  He has no acute symptoms.  He checked his blood pressure multiple times a day and is getting higher throughout the day.  He also took one of his wife's hydralazine's which is seem to help.  He was just seen by his primary doctor and had labs drawn ?  ? ?Home Medications ?Prior to Admission medications   ?Medication Sig Start Date End Date Taking? Authorizing Provider  ?ACCU-CHEK GUIDE test strip USE AS DIRECTED UP TO 4 TIMES A DAY AS DIRECTED TO CHECK BLOOD SUGAR 01/10/21   Burchette, Alinda Sierras, MD  ?atorvastatin (LIPITOR) 40 MG tablet TAKE 1 TABLET BY MOUTH EVERY DAY 07/02/20   Burchette, Alinda Sierras, MD  ?B-D TB SYRINGE 1CC/27GX1/2" 27G X 1/2" 1 ML MISC USE AS DIRECTED WITH METHOTREXATE 10/04/18   [provider]  ?betamethasone dipropionate 0.05 % cream Apply 1 application topically 2 (two) times daily. 09/13/19   [provider]  ?blood glucose meter kit and supplies Dispense based on patient and insurance preference. Use up to four times daily as directed. (FOR ICD-10 E10.9, E11.9). 12/19/20   Burchette, Alinda Sierras, MD  ?clindamycin (CLINDAGEL) 1 % gel Apply 1 application topically as needed. 11/05/19   [provider]  ?diclofenac Sodium (VOLTAREN) 1 % GEL SMARTSIG:Gram(s) Topical Twice Daily PRN 08/07/19   [provider]  ?fluticasone (FLONASE) 50 MCG/ACT nasal  spray INSERT 2 SPRAYS IN EACH NOSTRIL EVERY DAY 04/24/19   Burchette, Alinda Sierras, MD  ?folic acid (FOLVITE) 1 MG tablet Take 1 mg by mouth daily. 05/25/18   [provider]  ?gabapentin (NEURONTIN) 100 MG capsule     [provider]  ?hydrOXYzine (ATARAX/VISTARIL) 25 MG tablet Take 1 tablet (25 mg total) by mouth every 8 (eight) hours as needed. 04/21/19   Burchette, Alinda Sierras, MD  ?latanoprost (XALATAN) 0.005 % ophthalmic solution PLACE 1 DROP IN BOTH EYES AT BEDTIME 10/09/14   [provider]  ?losartan (COZAAR) 50 MG tablet TAKE 1 TABLET BY MOUTH EVERY DAY 03/17/21   Burchette, Alinda Sierras, MD  ?metFORMIN (GLUCOPHAGE) 850 MG tablet TAKE 1 TABLET BY MOUTH TWICE A DAY W/ MEAL 02/20/21   Burchette, Alinda Sierras, MD  ?methotrexate 50 MG/2ML injection INJECT 0.8ML WEEKLY 10/05/18   [provider]  ?Jonetta Speak Lancets 68L MISC Use as instructed to check blood sugars 2 times daily. Dx Code E11.9 03/29/19   Burchette, Alinda Sierras, MD  ?Potassium Gluconate 595 MG TBCR     [provider]  ?timolol (TIMOPTIC) 0.5 % ophthalmic solution Place 1 drop into both eyes 2 (two) times daily. 08/31/18   [provider]  ?triamcinolone cream (KENALOG) 0.1 % APPLY 1 APPLICATION 2 TIMES DAILY 01/23/21   Burchette, Alinda Sierras, MD  ?   ? ?Allergies    ?Patient has no known allergies.   ? ?Review of Systems   ?Review  of Systems  ?Constitutional:  Negative for fever.  ?Eyes:  Negative for visual disturbance.  ?Respiratory:  Negative for shortness of breath.   ?Cardiovascular:  Negative for chest pain.  ?Gastrointestinal:  Negative for abdominal pain.  ?Neurological:  Negative for weakness and headaches.  ?All other systems reviewed and are negative. ? ?Physical Exam ?Updated Vital Signs ?BP (!) 152/77   Pulse 66   Temp (!) 97.5 ?F (36.4 ?C) (Oral)   Resp 13   Ht 1.676 m (5' 6")   Wt 76.4 kg   SpO2 100%   BMI 27.20 kg/m?  ?Physical Exam ?CONSTITUTIONAL: Well developed/well nourished ?HEAD:  Normocephalic/atraumatic ?EYES: EOMI/PERRL  ?ENMT: mask in place ?NECK: supple no meningeal signs ?CV: S1/S2 noted, no murmurs/rubs/gallops noted ?LUNGS: Lungs are clear to auscultation bilaterally, no apparent distress ?ABDOMEN: soft, nontender ?NEURO:Awake/alert, no arm/leg drift ?Mental status appropriate.  No difficulty with speech. ?EXTREMITIES: pulses normal, full ROM ?SKIN: warm, color normal ?PSYCH: no abnormalities of mood noted ? ?ED Results / Procedures / Treatments   ?Labs ?(all labs ordered are listed, but only abnormal results are displayed) ?Labs Reviewed - No data to display ? ?EKG ?EKG Interpretation ? ?Date/Time:  Tuesday April 29 2021 00:07:19 EST ?Ventricular Rate:  69 ?PR Interval:  212 ?QRS Duration: 86 ?QT Interval:  381 ?QTC Calculation: 409 ?R Axis:   34 ?Text Interpretation: Sinus rhythm Borderline prolonged PR interval Confirmed by Ripley Fraise [phone]) on 04/29/2021 12:10:36 AM ? ?Radiology ?No results found. ? ?Procedures ?Procedures  ? ? ?Medications Ordered in ED ?Medications - No data to display ? ?ED Course/ Medical Decision Making/ A&P ?  ?                        ?Medical Decision Making ? ?This patient presents to the ED for concern of hypertension, this involves an extensive number of treatment options, and is a complaint that carries with it a high risk of complications and morbidity.  The differential diagnosis includes hypertensive emergency, acute coronary syndrome, acute stroke, medication noncompliance ? ?Comorbidities that complicate the patient evaluation: ?Patient?s presentation is complicated by their history of hypertension ? ? ?Additional history obtained: ?Additional history obtained from spouse ?Records reviewed Primary Care Documents ? ? ?Cardiac Monitoring: ?The patient was maintained on a cardiac monitor.  I personally viewed and interpreted the cardiac monitor which showed an underlying rhythm of:  sinus rhythm ? ? ?Test Considered: ?Considered labs, the patient  just had this performed as an outpatient and those were reviewed ? ? ?Complexity of problems addressed: ?Patient?s presentation is most consistent with  acute complicated illness/injury requiring diagnostic workup ? ?Disposition: ?After consideration of the diagnostic results and the patient?s response to treatment,  ?I feel that the patent would benefit from discharge   .  ? ?Patient presents with asymptomatic hypertension. ?He is in no distress.  He just had labs drawn by his PCP that were unremarkable.  His PCP plans to follow-up with him later this week to recheck his blood pressure and at that point he will likely get new medicines.  I advised patient to take his blood pressure meds as prescribed and not take his wife's medications.  Also advised him to only check his pressure about twice a day and record it. ?No indication for emergent management at this time.  No indication for emergent evaluation at this time ? ? ? ? ? ? ? ? ?Final Clinical Impression(s) / ED Diagnoses ?Final diagnoses:  ?  Primary hypertension  ? ? ?Rx / DC Orders ?ED Discharge Orders   ? ? None  ? ?  ? ? ?  ?Ripley Fraise, MD ?04/29/21 0149 ? ?

## 2021-05-02 ENCOUNTER — Ambulatory Visit (INDEPENDENT_AMBULATORY_CARE_PROVIDER_SITE_OTHER): Payer: Medicare Other | Admitting: Family Medicine

## 2021-05-02 ENCOUNTER — Encounter: Payer: Self-pay | Admitting: Family Medicine

## 2021-05-02 VITALS — BP 144/80 | HR 75 | Temp 98.2°F | Ht 66.0 in | Wt 169.4 lb

## 2021-05-02 DIAGNOSIS — I10 Essential (primary) hypertension: Secondary | ICD-10-CM | POA: Diagnosis not present

## 2021-05-02 MED ORDER — AMLODIPINE BESYLATE 5 MG PO TABS: 5.0000 mg | ORAL_TABLET | Freq: Every day | ORAL | 3 refills | Status: DC

## 2021-05-02 NOTE — Progress Notes (Signed)
Established Patient Office Visit  Subjective:  Patient ID: Kevin Wise, male    DOB: 07/16/1935  Age: 86 y.o. MRN: [id number]  CC:  Chief Complaint  Patient presents with   Follow-up    HPI Kevin Wise presents for follow-up regarding recent exacerbation of hypertension.  He is currently on losartan 50 mg daily.  Generally has been controlled in the past but had elevated reading here last visit.  He did not going to the ER after home blood pressure over 497W systolic.  He actually took one of his wife's hydralazine 10 mg 1/2 tablet and blood pressure did seem to improve slightly after that.  He brings in multiple readings over the past week and these are consistently over 263 systolic.  No alcohol use.  Tries to watch sodium intake.  No recent consistent headaches.  No peripheral edema.  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   ARTHRITIS 01/06/2007   CAROTID ARTERY STENOSIS, RIGHT 01/10/2009   Diabetes mellitus without complication ([place])    ERECTILE DYSFUNCTION 01/06/2007   HYPERGLYCEMIA, BORDERLINE 12/02/2009   HYPERLIPIDEMIA 01/06/2007   HYPERTENSION, BENIGN ESSENTIAL 01/06/2007   HYPOTHYROIDISM 01/10/2008   LIVER FUNCTION TESTS, ABNORMAL, HX OF 12/02/2009   Other symptoms involving cardiovascular system 01/10/2009   Rosacea 01/06/2007   UNS ADVRS EFF OTH RX MEDICINAL&BIOLOGICAL SBSTNC 01/10/2008   VITAMIN D DEFICIENCY 01/10/2008    Past Surgical History:  Procedure Laterality Date   HERNIA REPAIR     bilateral inguinal    TONSILLECTOMY      Family History  Problem Relation Age of Onset   Hyperlipidemia Mother    Diabetes Mother     Social History   Socioeconomic History   Marital status: Married    Spouse name: Not on file   Number of children: Not on file   Years of education: Not on file   Highest education level: Not on file  Occupational  History   Not on file  Tobacco Use   Smoking status: Never   Smokeless tobacco: Never  Vaping Use   Vaping Use: Never used  Substance and Sexual Activity   Alcohol use: Not on file   Drug use: No   Sexual activity: Not on file  Other Topics Concern   Not on file  Social History Narrative   Not on file   Social Determinants of Health   Financial Resource Strain: Low Risk    Difficulty of Paying Living Expenses: Not hard at all  Food Insecurity: No Food Insecurity   Worried About Charity fundraiser in the Last Year: Never true   Arboriculturist in the Last Year: Never true  Transportation Needs: No Transportation Needs   Lack of Transportation (Medical): No   Lack of Transportation (Non-Medical): No  Physical Activity: Insufficiently Active   Days of Exercise per Week: 3 days   Minutes of Exercise per Session: 40 min  Stress: No Stress Concern Present   Feeling of Stress : Not at all  Social Connections: Socially Integrated   Frequency of Communication with Friends and Family: More than three times a week   Frequency of Social Gatherings with Friends and Family: More than three times a week   Attends Religious Services: More than 4 times per year   Active Member of [employer] or Organizations: Yes   Attends Club or  Organization Meetings: More than 4 times per year   Marital Status: Married  Human resources officer Violence: Not At Risk   Fear of Current or Ex-Partner: No   Emotionally Abused: No   Physically Abused: No   Sexually Abused: No    Outpatient Medications Prior to Visit  Medication Sig Dispense Refill   ACCU-CHEK GUIDE test strip USE AS DIRECTED UP TO 4 TIMES A DAY AS DIRECTED TO CHECK BLOOD SUGAR 100 strip 3   atorvastatin (LIPITOR) 40 MG tablet TAKE 1 TABLET BY MOUTH EVERY DAY 90 tablet 3   B-D TB SYRINGE 1CC/27GX1/2" 27G X 1/2" 1 ML MISC USE AS DIRECTED WITH METHOTREXATE     betamethasone dipropionate 0.05 % cream Apply 1 application topically 2 (two) times daily.      blood glucose meter kit and supplies Dispense based on patient and insurance preference. Use up to four times daily as directed. (FOR ICD-10 E10.9, E11.9). 1 each 0   clindamycin (CLINDAGEL) 1 % gel Apply 1 application topically as needed.     diclofenac Sodium (VOLTAREN) 1 % GEL SMARTSIG:Gram(s) Topical Twice Daily PRN     fluticasone (FLONASE) 50 MCG/ACT nasal spray INSERT 2 SPRAYS IN EACH NOSTRIL EVERY DAY 48 g 1   folic acid (FOLVITE) 1 MG tablet Take 1 mg by mouth daily.     gabapentin (NEURONTIN) 100 MG capsule      hydrOXYzine (ATARAX/VISTARIL) 25 MG tablet Take 1 tablet (25 mg total) by mouth every 8 (eight) hours as needed. 90 tablet 3   latanoprost (XALATAN) 0.005 % ophthalmic solution PLACE 1 DROP IN BOTH EYES AT BEDTIME  3   losartan (COZAAR) 50 MG tablet TAKE 1 TABLET BY MOUTH EVERY DAY 90 tablet 1   metFORMIN (GLUCOPHAGE) 850 MG tablet TAKE 1 TABLET BY MOUTH TWICE A DAY W/ MEAL 180 tablet 0   methotrexate 50 MG/2ML injection INJECT 0.8ML WEEKLY     OneTouch Delica Lancets 86P MISC Use as instructed to check blood sugars 2 times daily. Dx Code E11.9 200 each 3   Potassium Gluconate 595 MG TBCR      timolol (TIMOPTIC) 0.5 % ophthalmic solution Place 1 drop into both eyes 2 (two) times daily.     triamcinolone cream (KENALOG) 0.1 % APPLY 1 APPLICATION 2 TIMES DAILY 454 g 1   No facility-administered medications prior to visit.    No Known Allergies  ROS Review of Systems  Constitutional:  Negative for fatigue.  Eyes:  Negative for visual disturbance.  Respiratory:  Negative for cough, chest tightness and shortness of breath.   Cardiovascular:  Negative for chest pain, palpitations and leg swelling.  Neurological:  Negative for dizziness, syncope, weakness, light-headedness and headaches.     Objective:    Physical Exam Constitutional:      Appearance: He is well-developed.  Eyes:     Pupils: Pupils are equal, round, and reactive to light.  Neck:     Thyroid: No  thyromegaly.  Cardiovascular:     Rate and Rhythm: Normal rate and regular rhythm.  Pulmonary:     Effort: Pulmonary effort is normal. No respiratory distress.     Breath sounds: Normal breath sounds. No wheezing or rales.  Musculoskeletal:     Cervical back: Neck supple.     Right lower leg: No edema.     Left lower leg: No edema.  Neurological:     Mental Status: He is alert and oriented to person, place, and time.  BP (!) 144/80 (BP Location: Left Arm, Cuff Size: Normal)    Pulse 75    Temp 98.2 F (36.8 C) (Oral)    Ht _0  (1.676 m)    Wt 169 lb 6.4 oz (76.8 kg)    SpO2 98%    BMI 27.34 kg/m  Wt Readings from Last 3 Encounters:  05/02/21 169 lb 6.4 oz (76.8 kg)  04/29/21 168 lb 8 oz (76.4 kg)  04/28/21 168 lb 8 oz (76.4 kg)     Health Maintenance Due  Topic Date Due   COVID-19 Vaccine (3 - Moderna risk series) 07/15/2020    There are no preventive care reminders to display for this patient.  Lab Results  Component Value Date   TSH 1.31 04/28/2021   Lab Results  Component Value Date   WBC 6.7 04/28/2021   HGB 12.0 (L) 04/28/2021   HCT 36.2 (L) 04/28/2021   MCV 96.5 04/28/2021   PLT 291.0 04/28/2021   Lab Results  Component Value Date   NA 141 04/28/2021   K 4.0 04/28/2021   CO2 29 04/28/2021   GLUCOSE 92 04/28/2021   BUN 17 04/28/2021   CREATININE 1.18 04/28/2021   BILITOT 0.7 04/28/2021   ALKPHOS 55 04/28/2021   AST 25 04/28/2021   ALT 25 04/28/2021   PROT 6.9 04/28/2021   ALBUMIN 4.6 04/28/2021   CALCIUM 9.4 04/28/2021   GFR 57.14 (L) 04/28/2021   Lab Results  Component Value Date   CHOL 116 04/28/2021   Lab Results  Component Value Date   HDL 39.20 04/28/2021   Lab Results  Component Value Date   LDLCALC 55 04/28/2021   Lab Results  Component Value Date   TRIG 109.0 04/28/2021   Lab Results  Component Value Date   CHOLHDL 3 04/28/2021   Lab Results  Component Value Date   HGBA1C 6.8 (A) 04/02/2021      Assessment & Plan:    Problem List Items Addressed This Visit       Unprioritized   HTN (hypertension) - Primary   Relevant Medications   amLODipine (NORVASC) 5 MG tablet  Patient has hypertension with current exacerbation of chronic condition.  We discussed nonpharmacologic management with exercise, weight control, keeping sodium intake less than 25 mg daily  -Add amlodipine 5 mg daily. -Continue monitoring. -We did check with his cuff and obtained reading 164/94 and repeat by me with manual cuff 144/82 -Set up 3-week follow-up to reassess -Could further titrate losartan at follow-up if not to goal  Meds ordered this encounter  Medications   amLODipine (NORVASC) 5 MG tablet    Sig: Take 1 tablet (5 mg total) by mouth daily.    Dispense:  90 tablet    Refill:  3    Follow-up: Return in about 3 weeks (around 05/23/2021).    Carolann Littler, MD

## 2021-05-23 ENCOUNTER — Encounter: Payer: Self-pay | Admitting: Family Medicine

## 2021-05-23 ENCOUNTER — Ambulatory Visit (INDEPENDENT_AMBULATORY_CARE_PROVIDER_SITE_OTHER): Payer: Medicare Other | Admitting: Family Medicine

## 2021-05-23 VITALS — BP 118/68 | HR 60 | Temp 97.5°F | Ht 66.0 in | Wt 170.0 lb

## 2021-05-23 DIAGNOSIS — I10 Essential (primary) hypertension: Secondary | ICD-10-CM | POA: Diagnosis not present

## 2021-05-23 NOTE — Progress Notes (Signed)
? ?Established Patient Office Visit ? ?Subjective:  ?Patient ID: Kevin Wise, male    DOB: [birth year]  Age: 86 y.o. MRN: [id number] ? ?CC:  ?Chief Complaint  ?Patient presents with  ? Follow-up  ? ? ?HPI ?Kevin Wise presents for hypertension follow-up.  We recently added amlodipine 5 mg daily.  His blood pressures have been gradually come back down when he had several readings past couple days 580 systolic with highs in the 130s.  No headaches.  No dizziness.  No peripheral edema.  Also remains on losartan.  Generally feels well. ? ?Past Medical History:  ?Diagnosis Date  ? 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 ARTHRITIS 01/06/2007  ? CAROTID ARTERY STENOSIS, RIGHT 01/10/2009  ? Diabetes mellitus without complication ([place])   ? ERECTILE DYSFUNCTION 01/06/2007  ? HYPERGLYCEMIA, BORDERLINE 12/02/2009  ? HYPERLIPIDEMIA 01/06/2007  ? HYPERTENSION, BENIGN ESSENTIAL 01/06/2007  ? HYPOTHYROIDISM 01/10/2008  ? LIVER FUNCTION TESTS, ABNORMAL, HX OF 12/02/2009  ? Other symptoms involving cardiovascular system 01/10/2009  ? Rosacea 01/06/2007  ? UNS ADVRS EFF OTH RX MEDICINAL&BIOLOGICAL SBSTNC 01/10/2008  ? VITAMIN D DEFICIENCY 01/10/2008  ? ? ?Past Surgical History:  ?Procedure Laterality Date  ? HERNIA REPAIR    ? bilateral inguinal   ? TONSILLECTOMY    ? ? ?Family History  ?Problem Relation Age of Onset  ? Hyperlipidemia Mother   ? Diabetes Mother   ? ? ?Social History  ? ?Socioeconomic History  ? Marital status: Married  ?  Spouse name: Not on file  ? Number of children: Not on file  ? Years of education: Not on file  ? Highest education level: Not on file  ?Occupational History  ? Not on file  ?Tobacco Use  ? Smoking status: Never  ? Smokeless tobacco: Never  ?Vaping Use  ? Vaping Use: Never used  ?Substance and Sexual Activity  ? Alcohol use: Not on file  ? Drug use: No  ? Sexual activity: Not on file  ?Other Topics Concern  ? Not on file  ?Social History Narrative  ? Not on file  ? ?Social Determinants of Health  ? ?Financial  Resource Strain: Low Risk   ? Difficulty of Paying Living Expenses: Not hard at all  ?Food Insecurity: No Food Insecurity  ? Worried About Charity fundraiser in the Last Year: Never true  ? Ran Out of Food in the Last Year: Never true  ?Transportation Needs: No Transportation Needs  ? Lack of Transportation (Medical): No  ? Lack of Transportation (Non-Medical): No  ?Physical Activity: Insufficiently Active  ? Days of Exercise per Week: 3 days  ? Minutes of Exercise per Session: 40 min  ?Stress: No Stress Concern Present  ? Feeling of Stress : Not at all  ?Social Connections: Socially Integrated  ? Frequency of Communication with Friends and Family: More than three times a week  ? Frequency of Social Gatherings with Friends and Family: More than three times a week  ? Attends Religious Services: More than 4 times per year  ? Active Member of Clubs or Organizations: Yes  ? Attends Archivist Meetings: More than 4 times per year  ? Marital Status: Married  ?Intimate Partner Violence: Not At Risk  ? Fear of Current or Ex-Partner: No  ? Emotionally Abused: No  ? Physically Abused: No  ? Sexually Abused: No  ? ? ?Outpatient Medications Prior to Visit  ?Medication Sig Dispense Refill  ? ACCU-CHEK GUIDE test strip USE AS DIRECTED UP TO  4 TIMES A DAY AS DIRECTED TO CHECK BLOOD SUGAR 100 strip 3  ? amLODipine (NORVASC) 5 MG tablet Take 1 tablet (5 mg total) by mouth daily. 90 tablet 3  ? atorvastatin (LIPITOR) 40 MG tablet TAKE 1 TABLET BY MOUTH EVERY DAY 90 tablet 3  ? B-D TB SYRINGE 1CC/27GX1/2" 27G X 1/2" 1 ML MISC USE AS DIRECTED WITH METHOTREXATE    ? betamethasone dipropionate 0.05 % cream Apply 1 application topically 2 (two) times daily.    ? blood glucose meter kit and supplies Dispense based on patient and insurance preference. Use up to four times daily as directed. (FOR ICD-10 E10.9, E11.9). 1 each 0  ? clindamycin (CLINDAGEL) 1 % gel Apply 1 application topically as needed.    ? diclofenac Sodium  (VOLTAREN) 1 % GEL SMARTSIG:Gram(s) Topical Twice Daily PRN    ? fluticasone (FLONASE) 50 MCG/ACT nasal spray INSERT 2 SPRAYS IN EACH NOSTRIL EVERY DAY 48 g 1  ? folic acid (FOLVITE) 1 MG tablet Take 1 mg by mouth daily.    ? gabapentin (NEURONTIN) 100 MG capsule     ? hydrOXYzine (ATARAX/VISTARIL) 25 MG tablet Take 1 tablet (25 mg total) by mouth every 8 (eight) hours as needed. 90 tablet 3  ? latanoprost (XALATAN) 0.005 % ophthalmic solution PLACE 1 DROP IN BOTH EYES AT BEDTIME  3  ? losartan (COZAAR) 50 MG tablet TAKE 1 TABLET BY MOUTH EVERY DAY 90 tablet 1  ? metFORMIN (GLUCOPHAGE) 850 MG tablet TAKE 1 TABLET BY MOUTH TWICE A DAY W/ MEAL 180 tablet 0  ? methotrexate 50 MG/2ML injection INJECT 0.8ML WEEKLY    ? OneTouch Delica Lancets 04V MISC Use as instructed to check blood sugars 2 times daily. Dx Code E11.9 200 each 3  ? Potassium Gluconate 595 MG TBCR     ? timolol (TIMOPTIC) 0.5 % ophthalmic solution Place 1 drop into both eyes 2 (two) times daily.    ? triamcinolone cream (KENALOG) 0.1 % APPLY 1 APPLICATION 2 TIMES DAILY 454 g 1  ? ?No facility-administered medications prior to visit.  ? ? ?No Known Allergies ? ?ROS ?Review of Systems  ?Constitutional:  Negative for fatigue.  ?Eyes:  Negative for visual disturbance.  ?Respiratory:  Negative for cough, chest tightness and shortness of breath.   ?Cardiovascular:  Negative for chest pain, palpitations and leg swelling.  ?Neurological:  Negative for dizziness, syncope, weakness, light-headedness and headaches.  ? ?  ?Objective:  ?  ?Physical Exam ?Vitals reviewed.  ?Constitutional:   ?   Appearance: Normal appearance.  ?Cardiovascular:  ?   Rate and Rhythm: Normal rate and regular rhythm.  ?Pulmonary:  ?   Effort: Pulmonary effort is normal.  ?   Breath sounds: Normal breath sounds.  ?Musculoskeletal:  ?   Right lower leg: No edema.  ?   Left lower leg: No edema.  ?Neurological:  ?   Mental Status: He is alert.  ? ? ?BP 118/68 (BP Location: Left Arm, Cuff  Size: Normal)   Pulse 60   Temp (!) 97.5 ?F (36.4 ?C) (Oral)   Ht _0  (1.676 m)   Wt 170 lb (77.1 kg)   SpO2 99%   BMI 27.44 kg/m?  ?Wt Readings from Last 3 Encounters:  ?05/23/21 170 lb (77.1 kg)  ?05/02/21 169 lb 6.4 oz (76.8 kg)  ?04/29/21 168 lb 8 oz (76.4 kg)  ? ? ? ?Health Maintenance Due  ?Topic Date Due  ? COVID-19 Vaccine (3 - Moderna  risk series) 07/15/2020  ? ? ?There are no preventive care reminders to display for this patient. ? ?Lab Results  ?Component Value Date  ? TSH 1.31 04/28/2021  ? ?Lab Results  ?Component Value Date  ? WBC 6.7 04/28/2021  ? HGB 12.0 (L) 04/28/2021  ? HCT 36.2 (L) 04/28/2021  ? MCV 96.5 04/28/2021  ? PLT 291.0 04/28/2021  ? ?Lab Results  ?Component Value Date  ? NA 141 04/28/2021  ? K 4.0 04/28/2021  ? CO2 29 04/28/2021  ? GLUCOSE 92 04/28/2021  ? BUN 17 04/28/2021  ? CREATININE 1.18 04/28/2021  ? BILITOT 0.7 04/28/2021  ? ALKPHOS 55 04/28/2021  ? AST 25 04/28/2021  ? ALT 25 04/28/2021  ? PROT 6.9 04/28/2021  ? ALBUMIN 4.6 04/28/2021  ? CALCIUM 9.4 04/28/2021  ? GFR 57.14 (L) 04/28/2021  ? ?Lab Results  ?Component Value Date  ? CHOL 116 04/28/2021  ? ?Lab Results  ?Component Value Date  ? HDL 39.20 04/28/2021  ? ?Lab Results  ?Component Value Date  ? LDLCALC 55 04/28/2021  ? ?Lab Results  ?Component Value Date  ? TRIG 109.0 04/28/2021  ? ?Lab Results  ?Component Value Date  ? CHOLHDL 3 04/28/2021  ? ?Lab Results  ?Component Value Date  ? HGBA1C 6.8 (A) 04/02/2021  ? ? ?  ?Assessment & Plan:  ? ?Problem List Items Addressed This Visit   ? ?  ? Unprioritized  ? HTN (hypertension) - Primary  ?Improved with recent addition of amlodipine 5 mg daily.  Continue amlodipine 5 mg daily and losartan 50 mg daily.  Continue low-sodium diet. ? ?Set up 48-monthfollow-up to reassess his diabetes. ?He will continue to monitor his home blood pressures at least a few times per week ? ?No orders of the defined types were placed in this encounter. ? ? ?Follow-up: Return in about 3 months  (around 08/22/2021).  ? ? ?BCarolann Littler MD ?

## 2021-06-18 ENCOUNTER — Other Ambulatory Visit: Payer: Self-pay | Admitting: Family Medicine

## 2021-06-30 ENCOUNTER — Other Ambulatory Visit: Payer: Self-pay | Admitting: Family Medicine

## 2021-07-14 ENCOUNTER — Telehealth: Payer: Self-pay | Admitting: Family Medicine

## 2021-07-14 NOTE — Telephone Encounter (Signed)
Calling for [facility], wants to know Dr Lucie Leather recommendation on the latest covid vaccine.

## 2021-07-15 NOTE — Telephone Encounter (Signed)
Pt informed of the message and verbalized understanding  

## 2021-08-06 ENCOUNTER — Telehealth: Payer: Self-pay | Admitting: Family Medicine

## 2021-08-06 NOTE — Telephone Encounter (Signed)
Patient dropped off lab readings from another dr.  [name] Dr. [name] to read them due to his Rheumatologist stating that some of them were abnormal.  Patient is requesting a call back--[phone]

## 2021-08-08 NOTE — Telephone Encounter (Signed)
Pt informed of the message and verbalized understanding  

## 2021-08-08 NOTE — Telephone Encounter (Signed)
Pt called again requesting a call back regarding lab results.

## 2021-08-22 ENCOUNTER — Encounter: Payer: Self-pay | Admitting: Family Medicine

## 2021-08-22 ENCOUNTER — Ambulatory Visit (INDEPENDENT_AMBULATORY_CARE_PROVIDER_SITE_OTHER): Payer: Medicare Other | Admitting: Family Medicine

## 2021-08-22 VITALS — BP 122/68 | HR 62 | Temp 97.8°F | Wt 170.6 lb

## 2021-08-22 DIAGNOSIS — R7401 Elevation of levels of liver transaminase levels: Secondary | ICD-10-CM | POA: Diagnosis not present

## 2021-08-22 DIAGNOSIS — E119 Type 2 diabetes mellitus without complications: Secondary | ICD-10-CM | POA: Diagnosis not present

## 2021-08-22 DIAGNOSIS — E1165 Type 2 diabetes mellitus with hyperglycemia: Secondary | ICD-10-CM

## 2021-08-22 DIAGNOSIS — I10 Essential (primary) hypertension: Secondary | ICD-10-CM | POA: Diagnosis not present

## 2021-08-22 DIAGNOSIS — E785 Hyperlipidemia, unspecified: Secondary | ICD-10-CM

## 2021-08-22 LAB — HEPATIC FUNCTION PANEL
ALT: 43 U/L (ref 0–53)
AST: 37 U/L (ref 0–37)
Albumin: 4.5 g/dL (ref 3.5–5.2)
Alkaline Phosphatase: 56 U/L (ref 39–117)
Bilirubin, Direct: 0.2 mg/dL (ref 0.0–0.3)
Total Bilirubin: 0.7 mg/dL (ref 0.2–1.2)
Total Protein: 7.2 g/dL (ref 6.0–8.3)

## 2021-08-22 LAB — HEMOGLOBIN A1C: Hgb A1c MFr Bld: 7 % — ABNORMAL HIGH (ref 4.6–6.5)

## 2021-08-22 MED ORDER — CLINDAMYCIN PHOSPHATE 1 % EX GEL: 1.0000 | CUTANEOUS | 0 refills | Status: DC | PRN

## 2021-08-22 MED ORDER — CLINDAMYCIN PHOSPHATE 1 % EX GEL: 1.0000 | CUTANEOUS | 11 refills | Status: DC | PRN

## 2021-08-22 NOTE — Progress Notes (Signed)
Established Patient Office Visit  Subjective   Patient ID: Kevin Wise, male    DOB: [birth year]  Age: 86 y.o. MRN: [id number]  Chief Complaint  Patient presents with   Follow-up    HPI   Here for medical follow-up.  We recently received some labs from his rheumatologist.  His hemoglobin is chronically slightly low in the 11 range.  New finding was ALT 74.  SGOT was normal.  Total bilirubin 1.9.  Albumin normal.  Rare alcohol use.  He does take atorvastatin and methotrexate but has been on methotrexate for several years now.  No prior history of liver difficulties.  Denies any abdominal pain.  Generally feels well overall.  Other chronic problems include history of hypertension, GERD, type 2 diabetes, rheumatoid arthritis, hyperlipidemia Blood sugars been well controlled.  No polyuria or polydipsia. Monitoring blood pressure regularly.  He states he actually has not been taking the amlodipine recently as blood pressure has been well controlled on losartan alone.  Home readings consistently ranging 122-130.  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   ARTHRITIS 01/06/2007   CAROTID ARTERY STENOSIS, RIGHT 01/10/2009   Diabetes mellitus without complication (HCC)    ERECTILE DYSFUNCTION 01/06/2007   HYPERGLYCEMIA, BORDERLINE 12/02/2009   HYPERLIPIDEMIA 01/06/2007   HYPERTENSION, BENIGN ESSENTIAL 01/06/2007   HYPOTHYROIDISM 01/10/2008   LIVER FUNCTION TESTS, ABNORMAL, HX OF 12/02/2009   Other symptoms involving cardiovascular system 01/10/2009   Rosacea 01/06/2007   UNS ADVRS EFF OTH RX MEDICINAL&BIOLOGICAL SBSTNC 01/10/2008   VITAMIN D DEFICIENCY 01/10/2008   Past Surgical History:  Procedure Laterality Date   HERNIA REPAIR     bilateral inguinal    TONSILLECTOMY      reports that he has never smoked. He has never used smokeless tobacco. He reports that he does not use drugs. No history on file for alcohol use. family history includes Diabetes in his  mother; Hyperlipidemia in his mother. No Known Allergies  Review of Systems  Constitutional:  Negative for malaise/fatigue.  Eyes:  Negative for blurred vision.  Respiratory:  Negative for shortness of breath.   Cardiovascular:  Negative for chest pain.  Neurological:  Negative for dizziness, weakness and headaches.      Objective:     BP 122/68 (BP Location: Left Arm, Cuff Size: Normal)   Pulse 62   Temp 97.8 F (36.6 C) (Oral)   Wt 170 lb 9.6 oz (77.4 kg)   SpO2 100%   BMI 27.54 kg/m  BP Readings from Last 3 Encounters:  08/22/21 122/68  05/23/21 118/68  05/02/21 (!) 144/80   Wt Readings from Last 3 Encounters:  08/22/21 170 lb 9.6 oz (77.4 kg)  05/23/21 170 lb (77.1 kg)  05/02/21 169 lb 6.4 oz (76.8 kg)      Physical Exam Constitutional:      Appearance: He is well-developed.  HENT:     Right Ear: External ear normal.     Left Ear: External ear normal.  Eyes:     Pupils: Pupils are equal, round, and reactive to light.  Neck:     Thyroid: No thyromegaly.  Cardiovascular:     Rate and Rhythm: Normal rate and regular rhythm.  Pulmonary:     Effort: Pulmonary effort is normal. No respiratory distress.     Breath sounds: Normal breath sounds. No wheezing or rales.  Abdominal:     Comments: Soft nontender.  No mass.  No hepatomegaly.  Musculoskeletal:     Cervical  back: Neck supple.     Right lower leg: No edema.     Left lower leg: No edema.  Neurological:     Mental Status: He is alert and oriented to person, place, and time.      No results found for any visits on 08/22/21.  Last CBC Lab Results  Component Value Date   WBC 6.7 04/28/2021   HGB 12.0 (L) 04/28/2021   HCT 36.2 (L) 04/28/2021   MCV 96.5 04/28/2021   MCH 32.1 01/26/2020   RDW 14.5 04/28/2021   PLT 291.0 04/28/2021   Last metabolic panel Lab Results  Component Value Date   GLUCOSE 92 04/28/2021   NA 141 04/28/2021   K 4.0 04/28/2021   CL 103 04/28/2021   CO2 29 04/28/2021    BUN 17 04/28/2021   CREATININE 1.18 04/28/2021   CALCIUM 9.4 04/28/2021   PHOS 2.7 12/02/2009   PROT 6.9 04/28/2021   ALBUMIN 4.6 04/28/2021   BILITOT 0.7 04/28/2021   ALKPHOS 55 04/28/2021   AST 25 04/28/2021   ALT 25 04/28/2021   Last lipids Lab Results  Component Value Date   CHOL 116 04/28/2021   HDL 39.20 04/28/2021   LDLCALC 55 04/28/2021   TRIG 109.0 04/28/2021   CHOLHDL 3 04/28/2021   Last hemoglobin A1c Lab Results  Component Value Date   HGBA1C 6.8 (A) 04/02/2021      The ASCVD Risk score (Arnett DK, et al., 2019) failed to calculate for the following reasons:   The 2019 ASCVD risk score is only valid for ages 54 to 64    Assessment & Plan:   #1 mild transaminase ALT enzyme.  Mildly elevated ALT with normal SGOT.  We explained this is very nonspecific.  He apparently does not have history of elevations in the past.  This could be related to atorvastatin or possibly methotrexate. -First step is repeat hepatic panel.  If climbing consider further evaluation and possibly hold atorvastatin for now.  #2 hypertension stable and at goal.  Initial reading slightly up today but much improved after rest.  Continue losartan 50 mg daily  #3 hyperlipidemia treated with atorvastatin.  Rechecking liver panel as above.  Recent lipids in March were well controlled.  #4 type 2 diabetes.  History of good control.  Recheck A1c today.   Return in about 3 months (around 11/22/2021).    Evelena Peat, MD

## 2021-09-17 ENCOUNTER — Encounter: Payer: Self-pay | Admitting: Family Medicine

## 2021-09-17 LAB — HM DIABETES EYE EXAM

## 2021-09-19 ENCOUNTER — Other Ambulatory Visit: Payer: Self-pay | Admitting: Family Medicine

## 2021-12-01 ENCOUNTER — Encounter: Payer: Self-pay | Admitting: Family Medicine

## 2021-12-01 ENCOUNTER — Ambulatory Visit (INDEPENDENT_AMBULATORY_CARE_PROVIDER_SITE_OTHER): Payer: Medicare Other | Admitting: Family Medicine

## 2021-12-01 VITALS — BP 142/80 | HR 59 | Temp 98.2°F | Ht 66.0 in | Wt 170.8 lb

## 2021-12-01 DIAGNOSIS — I10 Essential (primary) hypertension: Secondary | ICD-10-CM

## 2021-12-01 DIAGNOSIS — E1165 Type 2 diabetes mellitus with hyperglycemia: Secondary | ICD-10-CM

## 2021-12-01 LAB — POCT GLYCOSYLATED HEMOGLOBIN (HGB A1C): Hemoglobin A1C: 6.5 % — AB (ref 4.0–5.6)

## 2021-12-01 NOTE — Patient Instructions (Signed)
A1C improved to 6.5%!   Keep up the good work!  Monitor blood pressure and be in touch if consistently > 140/90.

## 2021-12-01 NOTE — Progress Notes (Signed)
Established Patient Office Visit  Subjective   Patient ID: Kevin Wise, male    DOB: July 27, 1935  Age: 86 y.o. MRN: [id number]  Chief Complaint  Patient presents with   Follow-up    HPI   Here for medical follow-up.  He has history of glaucoma, hypertension, type 2 diabetes, rheumatoid arthritis.  His arthritis is under good control with methotrexate.  Followed by rheumatology.  Generally feeling well.  His last A1c of 7.0 percent.  He has made some dietary changes since then and A1c today improved to 6.5%.  Remains on metformin.  He is getting yearly eye exams.  He has hypertension treated with losartan and amlodipine.  He takes atorvastatin for hyperlipidemia.  No significant myalgias.  No dizziness.  No chest pains.  No peripheral edema issues.  Has not been monitoring blood pressure recently at home.  He has good compliance with all medications.  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   ARTHRITIS 01/06/2007   CAROTID ARTERY STENOSIS, RIGHT 01/10/2009   Diabetes mellitus without complication (HCC)    ERECTILE DYSFUNCTION 01/06/2007   HYPERGLYCEMIA, BORDERLINE 12/02/2009   HYPERLIPIDEMIA 01/06/2007   HYPERTENSION, BENIGN ESSENTIAL 01/06/2007   HYPOTHYROIDISM 01/10/2008   LIVER FUNCTION TESTS, ABNORMAL, HX OF 12/02/2009   Other symptoms involving cardiovascular system 01/10/2009   Rosacea 01/06/2007   UNS ADVRS EFF OTH RX MEDICINAL&BIOLOGICAL SBSTNC 01/10/2008   VITAMIN D DEFICIENCY 01/10/2008   Past Surgical History:  Procedure Laterality Date   HERNIA REPAIR     bilateral inguinal    TONSILLECTOMY      reports that he has never smoked. He has never used smokeless tobacco. He reports that he does not use drugs. No history on file for alcohol use. family history includes Diabetes in his mother; Hyperlipidemia in his mother. No Known Allergies  Review of Systems  Constitutional:  Negative for malaise/fatigue.  Eyes:  Negative for blurred vision.   Respiratory:  Negative for shortness of breath.   Cardiovascular:  Negative for chest pain.  Neurological:  Negative for dizziness, weakness and headaches.      Objective:     BP (!) 142/80   Pulse (!) 59   Temp 98.2 F (36.8 C) (Oral)   Ht 5\' 6"  (1.676 m)   Wt 170 lb 12.8 oz (77.5 kg)   SpO2 99%   BMI 27.57 kg/m    Physical Exam Vitals reviewed.  Constitutional:      Appearance: He is well-developed.  HENT:     Right Ear: External ear normal.     Left Ear: External ear normal.  Eyes:     Pupils: Pupils are equal, round, and reactive to light.  Neck:     Thyroid: No thyromegaly.  Cardiovascular:     Rate and Rhythm: Normal rate and regular rhythm.  Pulmonary:     Effort: Pulmonary effort is normal. No respiratory distress.     Breath sounds: Normal breath sounds. No wheezing or rales.  Musculoskeletal:     Cervical back: Neck supple.     Right lower leg: No edema.     Left lower leg: No edema.  Neurological:     Mental Status: He is alert and oriented to person, place, and time.      Results for orders placed or performed in visit on 12/01/21  POCT glycosylated hemoglobin (Hb A1C)  Result Value Ref Range   Hemoglobin A1C 6.5 (A) 4.0 - 5.6 %   HbA1c POC (<>  result, manual entry)     HbA1c, POC (prediabetic range)     HbA1c, POC (controlled diabetic range)        The ASCVD Risk score (Arnett DK, et al., 2019) failed to calculate for the following reasons:   The 2019 ASCVD risk score is only valid for ages 56 to 73    Assessment & Plan:   #1 type 2 diabetes improved control with A1c today 6.5%.  Continue to avoid high glycemic foods.  Needs urine microalbumin with next labs.  Set up routine follow-up in 3 months.  Continue metformin 850 mg twice daily  #2 hypertension.  Slightly up today.  Initial reading 150/80 and repeat after rest 142/80.  We discussed DASH diet and particular reduction in sodium intake.  He recalls eating some gumbo last night that  may have been high in sodium.  Blood pressures generally been controlled on amlodipine and losartan.  Continue amlodipine 5 mg daily and losartan 50 mg daily.  Monitor closely and be in touch if consistently greater than 140/80 goal sodium intake less than 2500 mg daily  He and his wife plan to get flu vaccine in a week or so   Return in about 3 months (around 03/03/2022).    Carolann Littler, MD

## 2021-12-09 ENCOUNTER — Telehealth: Payer: Self-pay | Admitting: Family Medicine

## 2021-12-09 NOTE — Telephone Encounter (Signed)
Pt needs to speak with you regarding his methotrexate 50 MG/2ML injection. Says another provider handles that medication but he needs something from here

## 2021-12-10 NOTE — Telephone Encounter (Signed)
Labs faxed to Dr. Annamaria Boots

## 2021-12-10 NOTE — Telephone Encounter (Signed)
Pt states that Rheumo doc needs the last labs as soon as possible Please Fax To:  [phone] Attn:  Dr. Annamaria Boots  Pt states urgency as it is for Rheumo Arthritis

## 2021-12-11 ENCOUNTER — Telehealth: Payer: Self-pay | Admitting: Family Medicine

## 2021-12-11 NOTE — Telephone Encounter (Signed)
Left a message requesting the patient call back with more information as to how we can help today.

## 2021-12-11 NOTE — Telephone Encounter (Signed)
Pt call and want Remo Lipps to give him a call back.

## 2021-12-12 NOTE — Telephone Encounter (Signed)
Patient informed that labs were faxed to Dr. Annamaria Boots

## 2021-12-17 LAB — BASIC METABOLIC PANEL
BUN: 16 (ref 4–21)
CO2: 24 — AB (ref 13–22)
Chloride: 100 (ref 99–108)
Glucose: 104
Potassium: 4.2 mEq/L (ref 3.5–5.1)
Sodium: 140 (ref 137–147)

## 2021-12-17 LAB — HEPATIC FUNCTION PANEL
ALT: 88 U/L — AB (ref 10–40)
AST: 74 — AB (ref 14–40)
Alkaline Phosphatase: 78 (ref 25–125)
Bilirubin, Total: 0.5

## 2021-12-17 LAB — CBC: RBC: 3.68 — AB (ref 3.87–5.11)

## 2021-12-17 LAB — COMPREHENSIVE METABOLIC PANEL
Calcium: 9.5 (ref 8.7–10.7)
eGFR: 62

## 2021-12-17 LAB — CBC AND DIFFERENTIAL
HCT: 36 — AB (ref 41–53)
Hemoglobin: 12 — AB (ref 13.5–17.5)
Platelets: 243 10*3/uL (ref 150–400)
WBC: 7

## 2021-12-18 ENCOUNTER — Telehealth: Payer: Self-pay | Admitting: Family Medicine

## 2021-12-18 NOTE — Telephone Encounter (Signed)
Pt called to see if he can get a call back from the [place] to talk about his lab results he just received from Urologist.   Call Back [phone]  Please advise

## 2021-12-18 NOTE — Telephone Encounter (Signed)
Appointment scheduled for 12/26/21 at 1:15pm.   Pt aware to arrive at 1pm for check in.

## 2021-12-18 NOTE — Telephone Encounter (Signed)
Spoke with patient, he stated that he had labs completed by Dr. Janee Morn office in Rheumatology on 12/17/21, and he received a call on today stating that his liver reading were elevated , AST-74 and ALT-88.    Patient asked if he should come to our office for repeat labs?     Patient had his updated COVID booster on 12/15/21, he is concerned if this had any effect on his elevated liver enzymes.    Please advise

## 2021-12-19 ENCOUNTER — Other Ambulatory Visit: Payer: Self-pay | Admitting: Family Medicine

## 2021-12-26 ENCOUNTER — Encounter: Payer: Self-pay | Admitting: Family Medicine

## 2021-12-26 ENCOUNTER — Ambulatory Visit (INDEPENDENT_AMBULATORY_CARE_PROVIDER_SITE_OTHER): Payer: Medicare Other | Admitting: Family Medicine

## 2021-12-26 VITALS — BP 132/78 | HR 65 | Temp 97.9°F | Ht 66.0 in | Wt 169.9 lb

## 2021-12-26 DIAGNOSIS — R7401 Elevation of levels of liver transaminase levels: Secondary | ICD-10-CM

## 2021-12-26 DIAGNOSIS — Z79899 Other long term (current) drug therapy: Secondary | ICD-10-CM

## 2021-12-26 LAB — CBC WITH DIFFERENTIAL/PLATELET
Basophils Absolute: 0.1 10*3/uL (ref 0.0–0.1)
Basophils Relative: 1 % (ref 0.0–3.0)
Eosinophils Absolute: 0.3 10*3/uL (ref 0.0–0.7)
Eosinophils Relative: 5.2 % — ABNORMAL HIGH (ref 0.0–5.0)
HCT: 35 % — ABNORMAL LOW (ref 39.0–52.0)
Hemoglobin: 11.6 g/dL — ABNORMAL LOW (ref 13.0–17.0)
Lymphocytes Relative: 31.3 % (ref 12.0–46.0)
Lymphs Abs: 2.1 10*3/uL (ref 0.7–4.0)
MCHC: 33.2 g/dL (ref 30.0–36.0)
MCV: 96 fl (ref 78.0–100.0)
Monocytes Absolute: 0.3 10*3/uL (ref 0.1–1.0)
Monocytes Relative: 4.8 % (ref 3.0–12.0)
Neutro Abs: 3.8 10*3/uL (ref 1.4–7.7)
Neutrophils Relative %: 57.7 % (ref 43.0–77.0)
Platelets: 276 10*3/uL (ref 150.0–400.0)
RBC: 3.65 Mil/uL — ABNORMAL LOW (ref 4.22–5.81)
RDW: 14.9 % (ref 11.5–15.5)
WBC: 6.6 10*3/uL (ref 4.0–10.5)

## 2021-12-26 LAB — HEPATIC FUNCTION PANEL
ALT: 71 U/L — ABNORMAL HIGH (ref 0–53)
AST: 47 U/L — ABNORMAL HIGH (ref 0–37)
Albumin: 4.4 g/dL (ref 3.5–5.2)
Alkaline Phosphatase: 56 U/L (ref 39–117)
Bilirubin, Direct: 0.1 mg/dL (ref 0.0–0.3)
Total Bilirubin: 0.5 mg/dL (ref 0.2–1.2)
Total Protein: 7.2 g/dL (ref 6.0–8.3)

## 2021-12-26 NOTE — Progress Notes (Signed)
Established Patient Office Visit  Subjective   Patient ID: Kevin Wise, male    DOB: Dec 02, 1935  Age: 86 y.o. MRN: [id number]  Chief Complaint  Patient presents with   Follow-up    HPI   Here to discuss recent elevated liver transaminases per labs from rheumatologist.  He is on methotrexate for rheumatoid arthritis.  He had similar small bump in ALT enzyme back in June and on subsequent labs here this came back down to normal.  He had recent labs on 25 October with AST of 74 and ALT reportedly 88.  He rarely drinks alcohol.  He feels well.  Good appetite.  No abdominal pain.  No nausea or vomiting.  No stool changes.  He does take atorvastatin 40 mg daily but has been on this for some time.  Other medications include amlodipine, losartan, metformin.  No known family history of autoimmune hepatitis.  No family history of hemochromatosis.  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   ARTHRITIS 01/06/2007   CAROTID ARTERY STENOSIS, RIGHT 01/10/2009   Diabetes mellitus without complication (HCC)    ERECTILE DYSFUNCTION 01/06/2007   HYPERGLYCEMIA, BORDERLINE 12/02/2009   HYPERLIPIDEMIA 01/06/2007   HYPERTENSION, BENIGN ESSENTIAL 01/06/2007   HYPOTHYROIDISM 01/10/2008   LIVER FUNCTION TESTS, ABNORMAL, HX OF 12/02/2009   Other symptoms involving cardiovascular system 01/10/2009   Rosacea 01/06/2007   UNS ADVRS EFF OTH RX MEDICINAL&BIOLOGICAL SBSTNC 01/10/2008   VITAMIN D DEFICIENCY 01/10/2008   Past Surgical History:  Procedure Laterality Date   HERNIA REPAIR     bilateral inguinal    TONSILLECTOMY      reports that he has never smoked. He has never used smokeless tobacco. He reports that he does not use drugs. No history on file for alcohol use. family history includes Diabetes in his mother; Hyperlipidemia in his mother. No Known Allergies  Review of Systems  Constitutional:  Negative for malaise/fatigue.  Eyes:  Negative for blurred vision.  Respiratory:   Negative for shortness of breath.   Cardiovascular:  Negative for chest pain.  Gastrointestinal:  Negative for abdominal pain, nausea and vomiting.  Neurological:  Negative for dizziness, weakness and headaches.      Objective:     BP 132/78 (BP Location: Left Arm, Cuff Size: Normal)   Pulse 65   Temp 97.9 F (36.6 C) (Oral)   Ht 5\' 6"  (1.676 m)   Wt 169 lb 14.4 oz (77.1 kg)   SpO2 100%   BMI 27.42 kg/m    Physical Exam Vitals reviewed.  Constitutional:      Appearance: Normal appearance.  Cardiovascular:     Rate and Rhythm: Normal rate and regular rhythm.  Pulmonary:     Effort: Pulmonary effort is normal.     Breath sounds: Normal breath sounds.  Abdominal:     Palpations: Abdomen is soft.     Tenderness: There is no abdominal tenderness.     Comments: No hepatomegaly by palpation or percussion.  Neurological:     Mental Status: He is alert.      No results found for any visits on 12/26/21.    The ASCVD Risk score (Arnett DK, et al., 2019) failed to calculate for the following reasons:   The 2019 ASCVD risk score is only valid for ages 12 to 35    Assessment & Plan:   Problem List Items Addressed This Visit   None Visit Diagnoses     Transaminasemia    -  Primary   Relevant Orders   Hepatic function panel   High risk medication use       Relevant Orders   CBC with Differential/Platelet     Patient reportedly had recent mild elevated transaminases following recent methotrexate injection.  Has had similar mild transient elevations in the past.  No other worrisome symptoms or findings on exam.  -Recheck hepatic panel and will also check CBC -Consider further evaluation if liver transaminases still increasing further  No follow-ups on file.    Carolann Littler, MD

## 2021-12-29 NOTE — Addendum Note (Signed)
Addended by: Eulas Post on: 12/29/2021 09:14 AM   Modules accepted: Orders

## 2021-12-30 ENCOUNTER — Telehealth: Payer: Self-pay | Admitting: Family Medicine

## 2021-12-30 ENCOUNTER — Encounter: Payer: Self-pay | Admitting: Family Medicine

## 2021-12-30 ENCOUNTER — Ambulatory Visit (HOSPITAL_BASED_OUTPATIENT_CLINIC_OR_DEPARTMENT_OTHER)
Admission: RE | Admit: 2021-12-30 | Discharge: 2021-12-30 | Disposition: A | Discharge status: Home / Self Care | Payer: Medicare Other | Source: Ambulatory Visit | Attending: Family Medicine | Admitting: Family Medicine

## 2021-12-30 DIAGNOSIS — R7401 Elevation of levels of liver transaminase levels: Secondary | ICD-10-CM | POA: Diagnosis present

## 2021-12-30 NOTE — Telephone Encounter (Signed)
Labs faxed and left message for pt to return my call.

## 2021-12-30 NOTE — Telephone Encounter (Signed)
Patient aware.

## 2021-12-30 NOTE — Telephone Encounter (Signed)
Pt is calling and his rheumatologist dr young is still waiting on  lab results from 12-26-2021 to be fax to [phone].

## 2021-12-31 ENCOUNTER — Telehealth: Payer: Self-pay | Admitting: Family Medicine

## 2021-12-31 NOTE — Telephone Encounter (Signed)
Ray from Radiology called to speak with nurse concerning pt's Abdomen Ultrasound results.  Call back #[phone]  Please advise

## 2021-12-31 NOTE — Telephone Encounter (Signed)
I spoke with Kevin Wise at [facility] radiology and she was confirming that we received results for Abdominal ultrasounds for the patient. Abdominal ultrasound was ordered on 12/30/2021

## 2022-01-01 ENCOUNTER — Other Ambulatory Visit: Payer: Self-pay | Admitting: Family Medicine

## 2022-01-01 DIAGNOSIS — R1900 Intra-abdominal and pelvic swelling, mass and lump, unspecified site: Secondary | ICD-10-CM

## 2022-01-01 NOTE — Telephone Encounter (Signed)
I have already spoken to patient re: results and will be ordering the appropriate imaging.

## 2022-01-01 NOTE — Progress Notes (Signed)
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   Setting up MRI to further assess.    Pt aware of results.    Kristian Covey MD [facility]

## 2022-01-02 ENCOUNTER — Other Ambulatory Visit (INDEPENDENT_AMBULATORY_CARE_PROVIDER_SITE_OTHER): Payer: Medicare Other

## 2022-01-02 DIAGNOSIS — R7401 Elevation of levels of liver transaminase levels: Secondary | ICD-10-CM | POA: Diagnosis not present

## 2022-01-02 LAB — GAMMA GT: GGT: 37 U/L (ref 7–51)

## 2022-01-03 ENCOUNTER — Ambulatory Visit (HOSPITAL_BASED_OUTPATIENT_CLINIC_OR_DEPARTMENT_OTHER)
Admission: RE | Admit: 2022-01-03 | Discharge: 2022-01-03 | Disposition: A | Discharge status: Home / Self Care | Payer: Medicare Other | Source: Ambulatory Visit | Attending: Family Medicine | Admitting: Family Medicine

## 2022-01-03 DIAGNOSIS — R1900 Intra-abdominal and pelvic swelling, mass and lump, unspecified site: Secondary | ICD-10-CM | POA: Insufficient documentation

## 2022-01-03 LAB — IRON,TIBC AND FERRITIN PANEL
%SAT: 45 % (calc) (ref 20–48)
Ferritin: 109 ng/mL (ref 24–380)
Iron: 150 ug/dL (ref 50–180)
TIBC: 334 mcg/dL (calc) (ref 250–425)

## 2022-01-03 MED ORDER — GADOBUTROL 1 MMOL/ML IV SOLN
7.4000 mL | Freq: Once | INTRAVENOUS | Status: AC | PRN
  Administered 2022-01-03: 7.4 mL via INTRAVENOUS

## 2022-01-07 ENCOUNTER — Encounter: Payer: Self-pay | Admitting: *Deleted

## 2022-01-12 ENCOUNTER — Other Ambulatory Visit: Payer: Self-pay | Admitting: Family Medicine

## 2022-01-23 ENCOUNTER — Telehealth: Payer: Self-pay | Admitting: Family Medicine

## 2022-01-23 NOTE — Telephone Encounter (Signed)
Left a message for the pt to return my call.  

## 2022-01-23 NOTE — Telephone Encounter (Signed)
Patient informed of the message below.

## 2022-01-23 NOTE — Telephone Encounter (Signed)
Pt call and stated he want to know if dr.Burchette recommend the RSV shot for him and want a call back.

## 2022-01-27 ENCOUNTER — Other Ambulatory Visit: Payer: Self-pay | Admitting: Family Medicine

## 2022-01-28 ENCOUNTER — Ambulatory Visit (INDEPENDENT_AMBULATORY_CARE_PROVIDER_SITE_OTHER): Payer: Medicare Other

## 2022-01-28 VITALS — Ht 66.0 in | Wt 169.0 lb

## 2022-01-28 DIAGNOSIS — Z0000 Encounter for general adult medical examination without abnormal findings: Secondary | ICD-10-CM | POA: Diagnosis not present

## 2022-01-28 NOTE — Progress Notes (Signed)
Subjective:   Kevin Wise is a 86 y.o. male who presents for Medicare Annual/Subsequent preventive examination.  Review of Systems    Virtual Visit via Telephone Note  I connected with  Kevin Wise on 01/28/22 at  9:45 AM EST by telephone and verified that I am speaking with the correct person using two identifiers.  Location: Patient: Home Provider: Office Persons participating in the virtual visit: patient/Nurse Health Advisor   I discussed the limitations, risks, security and privacy concerns of performing an evaluation and management service by telephone and the availability of in person appointments. The patient expressed understanding and agreed to proceed.  Interactive audio and video telecommunications were attempted between this nurse and patient, however failed, due to patient having technical difficulties OR patient did not have access to video capability.  We continued and completed visit with audio only.  Some vital signs may be absent or patient reported.   Criselda Peaches, LPN  Cardiac Risk Factors include: advanced age (>27mn, >>9women);diabetes mellitus;male gender     Objective:    Today's Vitals   01/28/22 1002  Weight: 169 lb (76.7 kg)  Height: _0  (1.676 m)   Body mass index is 27.28 kg/m.     01/28/2022   10:11 AM 04/29/2021   12:03 AM 01/27/2021    8:28 AM 01/26/2020    8:32 AM  Advanced Directives  Does Patient Have a Medical Advance Directive? Yes No Yes Yes  Type of [place]aramed[place]of [place] will  [place] will [place] Church[place] will  Does patient want to make changes to medical advance directive?   No - Patient declined No - Patient declined  Copy of [place] Chart? No - copy requested       Current Medications (verified) Outpatient Encounter Medications as of 01/28/2022  Medication Sig   ACCU-CHEK GUIDE test strip USE AS DIRECTED UP TO 4  TIMES A DAY AS DIRECTED TO CHECK BLOOD SUGAR   amLODipine (NORVASC) 5 MG tablet Take 1 tablet (5 mg total) by mouth daily.   atorvastatin (LIPITOR) 40 MG tablet TAKE 1 TABLET BY MOUTH EVERY DAY   B-D TB SYRINGE 1CC/27GX1/2" 27G X 1/2" 1 ML MISC USE AS DIRECTED WITH METHOTREXATE   betamethasone dipropionate 0.05 % cream Apply 1 application topically 2 (two) times daily.   blood glucose meter kit and supplies Dispense based on patient and insurance preference. Use up to four times daily as directed. (FOR ICD-10 E10.9, E11.9).   clindamycin (CLINDAGEL) 1 % gel Apply 1 Application topically as needed.   diclofenac Sodium (VOLTAREN) 1 % GEL SMARTSIG:Gram(s) Topical Twice Daily PRN   fluticasone (FLONASE) 50 MCG/ACT nasal spray INSERT 2 SPRAYS IN EACH NOSTRIL EVERY DAY   folic acid (FOLVITE) 1 MG tablet Take 1 mg by mouth daily.   hydrOXYzine (ATARAX/VISTARIL) 25 MG tablet Take 1 tablet (25 mg total) by mouth every 8 (eight) hours as needed.   latanoprost (XALATAN) 0.005 % ophthalmic solution PLACE 1 DROP IN BOTH EYES AT BEDTIME   losartan (COZAAR) 50 MG tablet TAKE 1 TABLET BY MOUTH EVERY DAY   metFORMIN (GLUCOPHAGE) 850 MG tablet TAKE 1 TABLET BY MOUTH TWICE A DAY W/ MEAL   methotrexate 50 MG/2ML injection INJECT 0.8ML WEEKLY   OneTouch Delica Lancets [id number] Use as instructed to check blood sugars 2 times daily. Dx Code E11.9   Potassium Gluconate 595 MG TBCR  timolol (TIMOPTIC) 0.5 % ophthalmic solution Place 1 drop into both eyes 2 (two) times daily.   triamcinolone cream (KENALOG) 0.1 % APPLY 1 APPLICATION 2 TIMES DAILY   No facility-administered encounter medications on file as of 01/28/2022.    Allergies (verified) Patient has no known allergies.   History: 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   ARTHRITIS 01/06/2007   CAROTID ARTERY STENOSIS, RIGHT 01/10/2009   Diabetes mellitus without complication ([place])    ERECTILE DYSFUNCTION 01/06/2007   HYPERGLYCEMIA,  BORDERLINE 12/02/2009   HYPERLIPIDEMIA 01/06/2007   HYPERTENSION, BENIGN ESSENTIAL 01/06/2007   HYPOTHYROIDISM 01/10/2008   LIVER FUNCTION TESTS, ABNORMAL, HX OF 12/02/2009   Other symptoms involving cardiovascular system 01/10/2009   Rosacea 01/06/2007   UNS ADVRS EFF OTH RX MEDICINAL&BIOLOGICAL SBSTNC 01/10/2008   VITAMIN D DEFICIENCY 01/10/2008   Past Surgical History:  Procedure Laterality Date   HERNIA REPAIR     bilateral inguinal    TONSILLECTOMY     Family History  Problem Relation Age of Onset   Hyperlipidemia Mother    Diabetes Mother    Social History   Socioeconomic History   Marital status: Married    Spouse name: Not on file   Number of children: Not on file   Years of education: Not on file   Highest education level: Not on file  Occupational History   Not on file  Tobacco Use   Smoking status: Never   Smokeless tobacco: Never  Vaping Use   Vaping Use: Never used  Substance and Sexual Activity   Alcohol use: Not on file   Drug use: No   Sexual activity: Not on file  Other Topics Concern   Not on file  Social History Narrative   Not on file   Social Determinants of Health   Financial Resource Strain: Low Risk  (01/28/2022)   Overall Financial Resource Strain (CARDIA)    Difficulty of Paying Living Expenses: Not hard at all  Food Insecurity: No Food Insecurity (01/28/2022)   Hunger Vital Sign    Worried About Estate manager/land agent of Food in the Last Year: Never true    Ran Out of Food in the Last Year: Never true  Transportation Needs: No Transportation Needs (01/28/2022)   PRAPARE - Hydrologist (Medical): No    Lack of Transportation (Non-Medical): No  Physical Activity: Inactive (01/28/2022)   Exercise Vital Sign    Days of Exercise per Week: 0 days    Minutes of Exercise per Session: 0 min  Stress: No Stress Concern Present (01/28/2022)   Railroad     Feeling of Stress : Not at all  Social Connections: Unknown (01/28/2022)   Social Connection and Isolation Panel [NHANES]    Frequency of Communication with Friends and Family: More than three times a week    Frequency of Social Gatherings with Friends and Family: More than three times a week    Attends Religious Services: More than 4 times per year    Active Member of [employer] or Organizations: Yes    Attends Music therapist: More than 4 times per year    Marital Status: Patient refused    Tobacco Counseling Counseling given: Not Answered   Clinical Intake:  Pre-visit preparation completed: No  Nutrition Risk Assessment:  Has the patient had any N/V/D within the last 2 months?  No  Does the patient have any non-healing wounds?  No  Has the patient had any unintentional weight loss or weight gain?  No   Diabetes:  Is the patient diabetic?  Yes  If diabetic, was a CBG obtained today?  No  Did the patient bring in their glucometer from home?  No  How often do you monitor your CBG's? 2X daily.   Financial Strains and Diabetes Management:  Are you having any financial strains with the device, your supplies or your medication? No .  Does the patient want to be seen by Chronic Care Management for management of their diabetes?  No  Would the patient like to be referred to a Nutritionist or for Diabetic Management?  No   Diabetic Exams:  Diabetic Eye Exam: Completed No. Overdue for diabetic eye exam. Pt has been advised about the importance in completing this exam. A referral has been placed today. Message sent to referral coordinator for scheduling purposes. Advised pt to expect a call from office referred to regarding appt.  Diabetic Foot Exam: Completed No. Pt has been advised about the importance in completing this exam. Pt is scheduled for diabetic foot exam on FoPCPllowed by .       BMI - recorded: 27.28 Nutritional Status: BMI 25 -29 Overweight Nutritional  Risks: None Diabetes: Yes CBG done?: No Did pt. bring in CBG monitor from home?: No  How often do you need to have someone help you when you read instructions, pamphlets, or other written materials from your doctor or pharmacy?: 1 - Never  Diabetic?  Yes  Interpreter Needed?: No  Information entered by :: Rolene Arbour LPN   Activities of Daily Living    01/28/2022   10:10 AM  In your present state of health, do you have any difficulty performing the following activities:  Hearing? 0  Vision? 0  Difficulty concentrating or making decisions? 0  Walking or climbing stairs? 0  Dressing or bathing? 0  Doing errands, shopping? 0  Preparing Food and eating ? N  Using the Toilet? N  In the past six months, have you accidently leaked urine? N  Do you have problems with loss of bowel control? N  Managing your Medications? N  Managing your Finances? N  Housekeeping or managing your Housekeeping? N    Patient Care Team: Eulas Post, MD as PCP - General (Family Medicine) Viona Gilmore, [facility] as Pharmacist (Pharmacist)  Indicate any recent Medical Services you may have received from other than [facility] providers in the past year (date may be approximate).     Assessment:   This is a routine wellness examination for Kevin Wise.  Hearing/Vision screen Hearing Screening - Comments:: Denies hearing difficulties   Vision Screening - Comments:: Wears rx glasses - up to date with routine eye exams with  Dr Jerline Pain  Dietary issues and exercise activities discussed: Exercise limited by: None identified   Goals Addressed               This Visit's Progress     Stay Healthy (pt-stated)         Depression Screen    01/28/2022   10:10 AM 12/01/2021    9:33 AM 01/27/2021    8:21 AM 01/26/2020    8:35 AM 04/21/2019    9:01 AM 04/18/2018    8:21 AM 10/11/2017    8:22 AM  PHQ 2/9 Scores  PHQ - 2 Score 0 0 0 0 0 0 0  PHQ- 9 Score    0 0 0  Fall Risk    01/28/2022   10:11 AM  12/01/2021    9:33 AM 01/27/2021    8:24 AM 01/26/2020    8:34 AM 04/21/2019    9:01 AM  Fall Risk   Falls in the past year? 0 0 0 0 0  Number falls in past yr: 0 0 0 0 0  Injury with Fall? 0 0 0 0 0  Risk for fall due to : No Fall Risks No Fall Risks  No Fall Risks   Follow up Falls prevention discussed Falls evaluation completed  Falls evaluation completed;Falls prevention discussed     FALL RISK PREVENTION PERTAINING TO THE HOME:  Any stairs in or around the home? No  If so, are there any without handrails? No  Home free of loose throw rugs in walkways, pet beds, electrical cords, etc? Yes  Adequate lighting in your home to reduce risk of falls? Yes   ASSISTIVE DEVICES UTILIZED TO PREVENT FALLS:  Life alert? No  Use of a cane, walker or w/c? No  Grab bars in the bathroom? Yes  Shower chair or bench in shower? Yes  Elevated toilet seat or a handicapped toilet? Yes   TIMED UP AND GO:  Was the test performed? No . Audio Visit  Cognitive Function:        01/28/2022   10:11 AM 01/27/2021    8:26 AM  6CIT Screen  What Year? 0 points 0 points  What month? 0 points 0 points  What time? 0 points 0 points  Count back from 20 0 points 0 points  Months in reverse 0 points 0 points  Repeat phrase 0 points 0 points  Total Score 0 points 0 points    Immunizations Immunization History  Administered Date(s) Administered   Fluad Quad(high Dose 65+) 10/27/2018, 12/22/2018   Influenza Split 11/20/2010, 11/13/2011   Influenza Whole 12/31/2006, 11/22/2008, 11/14/2009   Influenza, High Dose Seasonal PF 11/23/2013, 11/12/2014, 12/09/2015, 12/11/2016, 11/19/2017   Influenza,inj,Quad PF,6+ Mos 10/31/2012   Influenza-Unspecified 11/23/2019   Moderna SARS-COV2 Booster Vaccination 12/18/2019, 06/17/2020   Moderna Sars-Covid-2 Vaccination 03/06/2019, 04/03/2019   Pneumococcal Conjugate-13 05/01/2013   Pneumococcal Polysaccharide-23 03/01/2003   Tdap 04/17/2010   Zoster Recombinat  (Shingrix) 09/11/2016, 11/30/2016   Zoster, Live 03/06/2011    TDAP status: Due, Education has been provided regarding the importance of this vaccine. Advised may receive this vaccine at local pharmacy or Health Dept. Aware to provide a copy of the vaccination record if obtained from local pharmacy or Health Dept. Verbalized acceptance and understanding.  Flu Vaccine status: Up to date  Pneumococcal vaccine status: Up to date  Covid-19 vaccine status: Completed vaccines  Qualifies for Shingles Vaccine? Yes   Zostavax completed Yes   Shingrix Completed?: Yes  Screening Tests Health Maintenance  Topic Date Due   Diabetic kidney evaluation - Urine ACR  03/30/2017   DTaP/Tdap/Td (2 - Td or Tdap) 04/17/2020   FOOT EXAM  07/26/2021   COVID-19 Vaccine (3 - Moderna risk series) 02/13/2022 (Originally 07/15/2020)   INFLUENZA VACCINE  05/24/2022 (Originally 09/23/2021)   HEMOGLOBIN A1C  06/02/2022   OPHTHALMOLOGY EXAM  09/18/2022   Diabetic kidney evaluation - GFR measurement  12/18/2022   Medicare Annual Wellness (AWV)  01/29/2023   Pneumonia Vaccine 28+ Years old  Completed   Zoster Vaccines- Shingrix  Completed   HPV VACCINES  Aged Out    Health Maintenance  Health Maintenance Due  Topic Date Due   Diabetic kidney evaluation -  Urine ACR  03/30/2017   DTaP/Tdap/Td (2 - Td or Tdap) 04/17/2020   FOOT EXAM  07/26/2021    Colorectal cancer screening: No longer required.   Lung Cancer Screening: (Low Dose CT Chest recommended if Age 72-80 years, 30 pack-year currently smoking OR have quit w/in 15years.) does not qualify.     Additional Screening:  Hepatitis C Screening: does not qualify; Completed   Vision Screening: Recommended annual ophthalmology exams for early detection of glaucoma and other disorders of the eye. Is the patient up to date with their annual eye exam?  Yes  Who is the provider or what is the name of the office in which the patient attends annual eye exams? Dr  Jerline Pain If pt is not established with a provider, would they like to be referred to a provider to establish care? No .   Dental Screening: Recommended annual dental exams for proper oral hygiene  Community Resource Referral / Chronic Care Management:  CRR required this visit?  No   CCM required this visit?  No      Plan:     I have personally reviewed and noted the following in the patient's chart:   Medical and social history Use of alcohol, tobacco or illicit drugs  Current medications and supplements including opioid prescriptions. Patient is not currently taking opioid prescriptions. Functional ability and status Nutritional status Physical activity Advanced directives List of other physicians Hospitalizations, surgeries, and ER visits in previous 12 months Vitals Screenings to include cognitive, depression, and falls Referrals and appointments  In addition, I have reviewed and discussed with patient certain preventive protocols, quality metrics, and best practice recommendations. A written personalized care plan for preventive services as well as general preventive health recommendations were provided to patient.     Criselda Peaches, LPN   [id number]   Nurse Notes: Patient due Diabetic kidney evaluation-Urine ACR and  Dtap/Tdap/Td(2-or Tdap)

## 2022-01-28 NOTE — Patient Instructions (Addendum)
Kevin Wise , Thank you for taking time to come for your Medicare Wellness Visit. I appreciate your ongoing commitment to your health goals. Please review the following plan we discussed and let me know if I can assist you in the future.   These are the goals we discussed:  Goals       Exercise 3x per week (30 min per time)      Patient Stated      I would like to maintain my health.      Stay Healthy (pt-stated)        This is a list of the screening recommended for you and due dates:  Health Maintenance  Topic Date Due   Yearly kidney health urinalysis for diabetes  03/30/2017   DTaP/Tdap/Td vaccine (2 - Td or Tdap) 04/17/2020   Complete foot exam   07/26/2021   COVID-19 Vaccine (3 - Moderna risk series) 02/13/2022*   Flu Shot  05/24/2022*   Hemoglobin A1C  06/02/2022   Eye exam for diabetics  09/18/2022   Yearly kidney function blood test for diabetes  12/18/2022   Medicare Annual Wellness Visit  01/29/2023   Pneumonia Vaccine  Completed   Zoster (Shingles) Vaccine  Completed   HPV Vaccine  Aged Out  *Topic was postponed. The date shown is not the original due date.    Advanced directives: Please bring a copy of your health care power of attorney and living will to the office to be added to your chart at your convenience.   Conditions/risks identified: None  Next appointment: Follow up in one year for your annual wellness visit.    Preventive Care 84 Years and Older, Male  Preventive care refers to lifestyle choices and visits with your health care provider that can promote health and wellness. What does preventive care include? A yearly physical exam. This is also called an annual well check. Dental exams once or twice a year. Routine eye exams. Ask your health care provider how often you should have your eyes checked. Personal lifestyle choices, including: Daily care of your teeth and gums. Regular physical activity. Eating a healthy diet. Avoiding tobacco and  drug use. Limiting alcohol use. Practicing safe sex. Taking low doses of aspirin every day. Taking vitamin and mineral supplements as recommended by your health care provider. What happens during an annual well check? The services and screenings done by your health care provider during your annual well check will depend on your age, overall health, lifestyle risk factors, and family history of disease. Counseling  Your health care provider may ask you questions about your: Alcohol use. Tobacco use. Drug use. Emotional well-being. Home and relationship well-being. Sexual activity. Eating habits. History of falls. Memory and ability to understand (cognition). Work and work Statistician. Screening  You may have the following tests or measurements: Height, weight, and BMI. Blood pressure. Lipid and cholesterol levels. These may be checked every 5 years, or more frequently if you are over 84 years old. Skin check. Lung cancer screening. You may have this screening every year starting at age 84 if you have a 30-pack-year history of smoking and currently smoke or have quit within the past 15 years. Fecal occult blood test (FOBT) of the stool. You may have this test every year starting at age 84 Flexible sigmoidoscopy or colonoscopy. You may have a sigmoidoscopy every 5 years or a colonoscopy every 10 years starting at age 84 Prostate cancer screening. Recommendations will vary depending on your family  history and other risks. Hepatitis C blood test. Hepatitis B blood test. Sexually transmitted disease (STD) testing. Diabetes screening. This is done by checking your blood sugar (glucose) after you have not eaten for a while (fasting). You may have this done every 1-3 years. Abdominal aortic aneurysm (AAA) screening. You may need this if you are a current or former smoker. Osteoporosis. You may be screened starting at age 84 if you are at high risk. Talk with your health care provider  about your test results, treatment options, and if necessary, the need for more tests. Vaccines  Your health care provider may recommend certain vaccines, such as: Influenza vaccine. This is recommended every year. Tetanus, diphtheria, and acellular pertussis (Tdap, Td) vaccine. You may need a Td booster every 10 years. Zoster vaccine. You may need this after age 84 Pneumococcal 13-valent conjugate (PCV13) vaccine. One dose is recommended after age 84 Pneumococcal polysaccharide (PPSV23) vaccine. One dose is recommended after age 84 Talk to your health care provider about which screenings and vaccines you need and how often you need them. This information is not intended to replace advice given to you by your health care provider. Make sure you discuss any questions you have with your health care provider. Document Released: 03/08/2015 Document Revised: 10/30/2015 Document Reviewed: 12/11/2014 Elsevier Interactive Patient Education  2017 [employer].  Fall Prevention in the Home Falls can cause injuries. They can happen to people of all ages. There are many things you can do to make your home safe and to help prevent falls. What can I do on the outside of my home? Regularly fix the edges of walkways and driveways and fix any cracks. Remove anything that might make you trip as you walk through a door, such as a raised step or threshold. Trim any bushes or trees on the path to your home. Use bright outdoor lighting. Clear any walking paths of anything that might make someone trip, such as rocks or tools. Regularly check to see if handrails are loose or broken. Make sure that both sides of any steps have handrails. Any raised decks and porches should have guardrails on the edges. Have any leaves, snow, or ice cleared regularly. Use sand or salt on walking paths during winter. Clean up any spills in your garage right away. This includes oil or grease spills. What can I do in the  bathroom? Use night lights. Install grab bars by the toilet and in the tub and shower. Do not use towel bars as grab bars. Use non-skid mats or decals in the tub or shower. If you need to sit down in the shower, use a plastic, non-slip stool. Keep the floor dry. Clean up any water that spills on the floor as soon as it happens. Remove soap buildup in the tub or shower regularly. Attach bath mats securely with double-sided non-slip rug tape. Do not have throw rugs and other things on the floor that can make you trip. What can I do in the bedroom? Use night lights. Make sure that you have a light by your bed that is easy to reach. Do not use any sheets or blankets that are too big for your bed. They should not hang down onto the floor. Have a firm chair that has side arms. You can use this for support while you get dressed. Do not have throw rugs and other things on the floor that can make you trip. What can I do in the kitchen? Clean up any  spills right away. Avoid walking on wet floors. Keep items that you use a lot in easy-to-reach places. If you need to reach something above you, use a strong step stool that has a grab bar. Keep electrical cords out of the way. Do not use floor polish or wax that makes floors slippery. If you must use wax, use non-skid floor wax. Do not have throw rugs and other things on the floor that can make you trip. What can I do with my stairs? Do not leave any items on the stairs. Make sure that there are handrails on both sides of the stairs and use them. Fix handrails that are broken or loose. Make sure that handrails are as long as the stairways. Check any carpeting to make sure that it is firmly attached to the stairs. Fix any carpet that is loose or worn. Avoid having throw rugs at the top or bottom of the stairs. If you do have throw rugs, attach them to the floor with carpet tape. Make sure that you have a light switch at the top of the stairs and the  bottom of the stairs. If you do not have them, ask someone to add them for you. What else can I do to help prevent falls? Wear shoes that: Do not have high heels. Have rubber bottoms. Are comfortable and fit you well. Are closed at the toe. Do not wear sandals. If you use a stepladder: Make sure that it is fully opened. Do not climb a closed stepladder. Make sure that both sides of the stepladder are locked into place. Ask someone to hold it for you, if possible. Clearly mark and make sure that you can see: Any grab bars or handrails. First and last steps. Where the edge of each step is. Use tools that help you move around (mobility aids) if they are needed. These include: Canes. Walkers. Scooters. Crutches. Turn on the lights when you go into a dark area. Replace any light bulbs as soon as they burn out. Set up your furniture so you have a clear path. Avoid moving your furniture around. If any of your floors are uneven, fix them. If there are any pets around you, be aware of where they are. Review your medicines with your doctor. Some medicines can make you feel dizzy. This can increase your chance of falling. Ask your doctor what other things that you can do to help prevent falls. This information is not intended to replace advice given to you by your health care provider. Make sure you discuss any questions you have with your health care provider. Document Released: 12/06/2008 Document Revised: 07/18/2015 Document Reviewed: 03/16/2014 Elsevier Interactive Patient Education  2017 [employer].

## 2022-02-17 ENCOUNTER — Ambulatory Visit (INDEPENDENT_AMBULATORY_CARE_PROVIDER_SITE_OTHER): Payer: Medicare Other | Admitting: Family Medicine

## 2022-02-17 ENCOUNTER — Encounter: Payer: Self-pay | Admitting: Family Medicine

## 2022-02-17 VITALS — BP 138/72 | HR 65 | Temp 98.1°F | Ht 66.0 in | Wt 172.9 lb

## 2022-02-17 DIAGNOSIS — E1165 Type 2 diabetes mellitus with hyperglycemia: Secondary | ICD-10-CM

## 2022-02-17 DIAGNOSIS — R109 Unspecified abdominal pain: Secondary | ICD-10-CM

## 2022-02-17 NOTE — Progress Notes (Signed)
Established Patient Office Visit  Subjective   Patient ID: Kevin Wise, male    DOB: [birth year]  Age: 86 y.o. MRN: [id number]  Chief Complaint  Patient presents with   Abdominal Pain    Patient complains of abdominal pain, x1 week, Tried Prednisone with little relief    HPI   Mr. Legere seen with approxi-1 week history of left side pain.  This is really down below the left rib cage area all the way to the iliac crest region.  He actually saw his rheumatologist and was placed on prednisone taper which is currently day 3 without any significant improvement.  Denies any urinary symptoms.  No change in bowel habits.  No fever or chills.  No skin rashes.  No nausea or vomiting.  Appetite stable.  He is not sure but thinks he may have strained this with some of the Christmas decorations.  He noted pain the other day when standing up straightening up after bending down to pick up something.  No thoracic back pain.  He had recent MRI abdomen which was reviewed with patient with no abdominal masses noted.  He had some benign-appearing cysts in the kidneys bilaterally.  There is mention of colonic diverticulosis without diverticulitis.  He had some tiny simple appearing cystic lesions scattered throughout the pancreas but no concerning lesions.  Brings in log of blood sugars.  He had 1 recent reading of 75 but was asymptomatic.  This was a little low for him.  Since then blood sugars have leveled out.  Most recent A1c was 6.5%.  He remains on metformin.  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   ARTHRITIS 01/06/2007   CAROTID ARTERY STENOSIS, RIGHT 01/10/2009   Diabetes mellitus without complication (HCC)    ERECTILE DYSFUNCTION 01/06/2007   HYPERGLYCEMIA, BORDERLINE 12/02/2009   HYPERLIPIDEMIA 01/06/2007   HYPERTENSION, BENIGN ESSENTIAL 01/06/2007   HYPOTHYROIDISM 01/10/2008   LIVER FUNCTION TESTS, ABNORMAL, HX OF 12/02/2009   Other symptoms involving cardiovascular  system 01/10/2009   Rosacea 01/06/2007   UNS ADVRS EFF OTH RX MEDICINAL&BIOLOGICAL SBSTNC 01/10/2008   VITAMIN D DEFICIENCY 01/10/2008   Past Surgical History:  Procedure Laterality Date   HERNIA REPAIR     bilateral inguinal    TONSILLECTOMY      reports that he has never smoked. He has never used smokeless tobacco. He reports that he does not use drugs. No history on file for alcohol use. family history includes Diabetes in his mother; Hyperlipidemia in his mother. No Known Allergies  Review of Systems  Constitutional:  Negative for chills and fever.  Respiratory:  Negative for cough and shortness of breath.   Cardiovascular:  Negative for chest pain.  Gastrointestinal:  Positive for abdominal pain. Negative for blood in stool, constipation, diarrhea, heartburn, melena, nausea and vomiting.  Genitourinary:  Negative for dysuria, frequency and hematuria.  Skin:  Negative for rash.      Objective:     BP 138/72 (BP Location: Left Arm, Cuff Size: Normal)   Pulse 65   Temp 98.1 F (36.7 C) (Oral)   Ht 5\' 6"  (1.676 m)   Wt 172 lb 14.4 oz (78.4 kg)   SpO2 100%   BMI 27.91 kg/m  BP Readings from Last 3 Encounters:  02/17/22 138/72  12/26/21 132/78  12/01/21 (!) 142/80   Wt Readings from Last 3 Encounters:  02/17/22 172 lb 14.4 oz (78.4 kg)  01/28/22 169 lb (76.7 kg)  12/26/21 169 lb  14.4 oz (77.1 kg)      Physical Exam Vitals reviewed.  Constitutional:      General: He is not in acute distress.    Appearance: He is well-developed. He is not ill-appearing.  Cardiovascular:     Rate and Rhythm: Normal rate and regular rhythm.  Pulmonary:     Effort: Pulmonary effort is normal.     Breath sounds: Normal breath sounds. No wheezing or rales.  Abdominal:     Comments: Nondistended.  Normal bowel sounds.  Soft with no reproducible tenderness.  No guarding or rebound.  No splenomegaly.  No masses palpated.  No hernias appreciated.  Skin:    Findings: No rash.   Neurological:     Mental Status: He is alert.      No results found for any visits on 02/17/22.    The ASCVD Risk score (Arnett DK, et al., 2019) failed to calculate for the following reasons:   The 2019 ASCVD risk score is only valid for ages 54 to 37    Assessment & Plan:   #1 left lateral abdominal pain.  Suspect probably oblique strain/muscular strain.  Does not have any red flags such as fever, appetite change, weight loss, stool change, etc.  No reproducible pain on exam to palpation. -He will finish out the prednisone that his rheumatologist started.  Observe for now.  Be in touch if not improving over the next couple weeks  #2 type 2 diabetes.  History of good control.  Last A1c 6.5%.  Continue metformin.  He has scheduled follow-up in January and plan to repeat A1c at that time.   Evelena Peat, MD

## 2022-02-24 ENCOUNTER — Telehealth: Payer: Medicare Other

## 2022-02-25 ENCOUNTER — Telehealth: Payer: Self-pay | Admitting: Family Medicine

## 2022-02-25 NOTE — Telephone Encounter (Signed)
Left a message for the pt to return my call.  

## 2022-02-25 NOTE — Telephone Encounter (Signed)
Patient returned call

## 2022-02-25 NOTE — Telephone Encounter (Signed)
I spoke with the patient and he reported ongoing left sided abdominal pain near pelvic area. Patient stated he is still taking Prednisone as prescribed and is currently on day 11 of prescription. Patient wanted advice on how to proceed and inquired if PCP thinks he may need a X-ray.

## 2022-02-25 NOTE — Telephone Encounter (Signed)
Patient callback and stated he missed a call from [facility]. I let patient know that CMA was unavailable, but I would send message back that patient had returned call.       FYI

## 2022-02-25 NOTE — Telephone Encounter (Signed)
On day 11 of prednisone and no change in his condition (left side). Requesting an xray, would like to speak with Mykal.

## 2022-02-25 NOTE — Telephone Encounter (Signed)
Patient informed of the message and verbalized understanding 

## 2022-02-26 ENCOUNTER — Other Ambulatory Visit: Payer: Self-pay | Admitting: Family Medicine

## 2022-02-27 MED ORDER — ACCU-CHEK GUIDE VI STRP: ORAL_STRIP | 3 refills | Status: DC

## 2022-02-27 NOTE — Addendum Note (Signed)
Addended by: Nilda Riggs on: 02/27/2022 07:11 AM   Modules accepted: Orders

## 2022-03-02 ENCOUNTER — Ambulatory Visit (INDEPENDENT_AMBULATORY_CARE_PROVIDER_SITE_OTHER): Payer: Medicare Other | Admitting: Family Medicine

## 2022-03-02 ENCOUNTER — Encounter: Payer: Self-pay | Admitting: Family Medicine

## 2022-03-02 VITALS — BP 124/70 | HR 60 | Temp 98.4°F | Ht 66.0 in | Wt 168.8 lb

## 2022-03-02 DIAGNOSIS — E1165 Type 2 diabetes mellitus with hyperglycemia: Secondary | ICD-10-CM | POA: Diagnosis not present

## 2022-03-02 DIAGNOSIS — I10 Essential (primary) hypertension: Secondary | ICD-10-CM

## 2022-03-02 DIAGNOSIS — R1032 Left lower quadrant pain: Secondary | ICD-10-CM | POA: Diagnosis not present

## 2022-03-02 LAB — POCT GLYCOSYLATED HEMOGLOBIN (HGB A1C): Hemoglobin A1C: 7.2 % — AB (ref 4.0–5.6)

## 2022-03-02 NOTE — Progress Notes (Signed)
Established Patient Office Visit  Subjective   Patient ID: Kevin Wise, male    DOB: 26-Sep-1935  Age: 87 y.o. MRN: [id number]  Chief Complaint  Patient presents with   Follow-up    HPI   Mr. Rami seen for medical follow-up.  Type 2 diabetes.  A1c is up slightly today at 7.2% but he was recently on prednisone and also had slightly worse compliance over the holidays.  In general though his diabetes has been very well-controlled.  He remains on metformin.  Denies any cement polyuria or polydipsia.  He did have some increased urine frequency when he was taking the prednisone  Hypertension treated with amlodipine and losartan.  Compliant with medications.  No dizziness.  No chest pains.  Has not been walking as much recently.  Recent left lower quadrant abdominal pain.  No stool changes.  No dysuria.  Pain has gradually improved after starting prednisone per rheumatologist.  He denies any specific injury.  Recent MRI results reviewed of abdomen with some incidental findings but no concerning findings.  Denies any recent appetite or weight changes.  No fever.  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   ARTHRITIS 01/06/2007   CAROTID ARTERY STENOSIS, RIGHT 01/10/2009   Diabetes mellitus without complication ([place])    ERECTILE DYSFUNCTION 01/06/2007   HYPERGLYCEMIA, BORDERLINE 12/02/2009   HYPERLIPIDEMIA 01/06/2007   HYPERTENSION, BENIGN ESSENTIAL 01/06/2007   HYPOTHYROIDISM 01/10/2008   LIVER FUNCTION TESTS, ABNORMAL, HX OF 12/02/2009   Other symptoms involving cardiovascular system 01/10/2009   Rosacea 01/06/2007   UNS ADVRS EFF OTH RX MEDICINAL&BIOLOGICAL SBSTNC 01/10/2008   VITAMIN D DEFICIENCY 01/10/2008   Past Surgical History:  Procedure Laterality Date   HERNIA REPAIR     bilateral inguinal    TONSILLECTOMY      reports that he has never smoked. He has never used smokeless tobacco. He reports that he does not use drugs. No history on file for alcohol  use. family history includes Diabetes in his mother; Hyperlipidemia in his mother. No Known Allergies  Review of Systems  Constitutional:  Negative for chills, fever, malaise/fatigue and weight loss.  Eyes:  Negative for blurred vision.  Respiratory:  Negative for shortness of breath.   Cardiovascular:  Negative for chest pain.  Gastrointestinal:  Negative for blood in stool, constipation, diarrhea, melena, nausea and vomiting.  Neurological:  Negative for dizziness, weakness and headaches.      Objective:     BP 124/70 (BP Location: Left Arm, Patient Position: Sitting, Cuff Size: Normal)   Pulse 60   Temp 98.4 F (36.9 C) (Oral)   Ht 5\' 6"  (1.676 m)   Wt 168 lb 12.8 oz (76.6 kg)   SpO2 99%   BMI 27.25 kg/m    Physical Exam Vitals reviewed.  Constitutional:      Appearance: Normal appearance.  Cardiovascular:     Rate and Rhythm: Normal rate and regular rhythm.  Pulmonary:     Effort: Pulmonary effort is normal.     Breath sounds: Normal breath sounds.  Abdominal:     Palpations: Abdomen is soft. There is no mass.     Tenderness: There is no abdominal tenderness.     Hernia: No hernia is present.  Musculoskeletal:     Right lower leg: No edema.     Left lower leg: No edema.  Neurological:     Mental Status: He is alert.      Results for orders placed or performed  in visit on 03/02/22  POCT glycosylated hemoglobin (Hb A1C)  Result Value Ref Range   Hemoglobin A1C 7.2 (A) 4.0 - 5.6 %   HbA1c POC (<> result, manual entry)     HbA1c, POC (prediabetic range)     HbA1c, POC (controlled diabetic range)        The ASCVD Risk score (Arnett DK, et al., 2019) failed to calculate for the following reasons:   The 2019 ASCVD risk score is only valid for ages 3 to 47    Assessment & Plan:   #1 type 2 diabetes with slightly worse control with A1c today 7.2%-probably at least partly reflecting recent prednisone therapy.  Try to establish more consistent walking.   Tighten up diet.  He would like to give this 3 months of lifestyle modification and then reassess at physical around April  #2 hypertension well-controlled on losartan 50 mg daily and amlodipine 5 mg daily.  Continue current medications.  Continue low-sodium diet  #3 recent left lower quadrant abdominal pain.  MRI unremarkable.  Question musculoskeletal.  Gradually improving following trial of prednisone  Evelena Peat, MD

## 2022-03-02 NOTE — Patient Instructions (Signed)
A1C today was 7.2%- likely reflecting recent prednisone, to some degree  Set up complete physical for follow up in 3 months.

## 2022-04-13 ENCOUNTER — Telehealth: Payer: Self-pay | Admitting: Family Medicine

## 2022-04-13 MED ORDER — AMLODIPINE BESYLATE 5 MG PO TABS: 5.0000 mg | ORAL_TABLET | Freq: Every day | ORAL | 0 refills | Status: DC

## 2022-04-13 NOTE — Telephone Encounter (Signed)
Pt would like not elaborate he has a couple question concerning amlodipine and would like mykal to return his call

## 2022-04-13 NOTE — Telephone Encounter (Signed)
Patient requested refill on Amlodipine and rx has been sent

## 2022-05-25 ENCOUNTER — Other Ambulatory Visit: Payer: Self-pay | Admitting: Family Medicine

## 2022-05-25 MED ORDER — AMLODIPINE BESYLATE 5 MG PO TABS: 5.0000 mg | ORAL_TABLET | Freq: Every day | ORAL | 3 refills | Status: DC

## 2022-05-25 MED ORDER — ATORVASTATIN CALCIUM 40 MG PO TABS: 40.0000 mg | ORAL_TABLET | Freq: Every day | ORAL | 3 refills | Status: DC

## 2022-05-25 MED ORDER — METFORMIN HCL 850 MG PO TABS: ORAL_TABLET | ORAL | 3 refills | Status: DC

## 2022-05-25 MED ORDER — LOSARTAN POTASSIUM 50 MG PO TABS: 50.0000 mg | ORAL_TABLET | Freq: Every day | ORAL | 1 refills | Status: DC

## 2022-05-25 MED ORDER — CLINDAMYCIN PHOSPHATE 1 % EX GEL: 1.0000 | CUTANEOUS | 11 refills | Status: DC | PRN

## 2022-05-25 MED ORDER — TRIAMCINOLONE ACETONIDE 0.1 % EX CREA: TOPICAL_CREAM | CUTANEOUS | 1 refills | Status: DC

## 2022-05-25 MED ORDER — FLUTICASONE PROPIONATE 50 MCG/ACT NA SUSP: NASAL | 1 refills | Status: AC

## 2022-05-25 NOTE — Telephone Encounter (Signed)
Pt call and stated he want you to give him a call back he want to talk about his prescriptions.

## 2022-05-25 NOTE — Telephone Encounter (Signed)
Yes ok 

## 2022-06-01 ENCOUNTER — Encounter: Payer: Medicare Other | Admitting: Family Medicine

## 2022-07-02 ENCOUNTER — Other Ambulatory Visit: Payer: Self-pay | Admitting: Family Medicine

## 2022-07-17 ENCOUNTER — Other Ambulatory Visit: Payer: Self-pay | Admitting: Family Medicine

## 2022-08-03 ENCOUNTER — Ambulatory Visit (INDEPENDENT_AMBULATORY_CARE_PROVIDER_SITE_OTHER): Payer: Medicare Other | Admitting: Family Medicine

## 2022-08-03 VITALS — BP 134/70 | HR 60 | Temp 98.3°F | Ht 66.0 in | Wt 167.5 lb

## 2022-08-03 DIAGNOSIS — E785 Hyperlipidemia, unspecified: Secondary | ICD-10-CM | POA: Diagnosis not present

## 2022-08-03 DIAGNOSIS — Z7984 Long term (current) use of oral hypoglycemic drugs: Secondary | ICD-10-CM

## 2022-08-03 DIAGNOSIS — Z0000 Encounter for general adult medical examination without abnormal findings: Secondary | ICD-10-CM | POA: Diagnosis not present

## 2022-08-03 DIAGNOSIS — E1165 Type 2 diabetes mellitus with hyperglycemia: Secondary | ICD-10-CM | POA: Diagnosis not present

## 2022-08-03 LAB — CBC WITH DIFFERENTIAL/PLATELET
Basophils Absolute: 0.1 10*3/uL (ref 0.0–0.1)
Basophils Relative: 0.8 % (ref 0.0–3.0)
Eosinophils Absolute: 0.3 10*3/uL (ref 0.0–0.7)
Eosinophils Relative: 4.2 % (ref 0.0–5.0)
HCT: 36 % — ABNORMAL LOW (ref 39.0–52.0)
Hemoglobin: 11.9 g/dL — ABNORMAL LOW (ref 13.0–17.0)
Lymphocytes Relative: 26.6 % (ref 12.0–46.0)
Lymphs Abs: 1.9 10*3/uL (ref 0.7–4.0)
MCHC: 33 g/dL (ref 30.0–36.0)
MCV: 97.2 fl (ref 78.0–100.0)
Monocytes Absolute: 0.6 10*3/uL (ref 0.1–1.0)
Monocytes Relative: 8.5 % (ref 3.0–12.0)
Neutro Abs: 4.3 10*3/uL (ref 1.4–7.7)
Neutrophils Relative %: 59.9 % (ref 43.0–77.0)
Platelets: 302 10*3/uL (ref 150.0–400.0)
RBC: 3.71 Mil/uL — ABNORMAL LOW (ref 4.22–5.81)
RDW: 14.5 % (ref 11.5–15.5)
WBC: 7.2 10*3/uL (ref 4.0–10.5)

## 2022-08-03 LAB — POCT GLYCOSYLATED HEMOGLOBIN (HGB A1C): Hemoglobin A1C: 6.6 % — AB (ref 4.0–5.6)

## 2022-08-03 LAB — BASIC METABOLIC PANEL
BUN: 13 mg/dL (ref 6–23)
CO2: 27 mEq/L (ref 19–32)
Calcium: 9.7 mg/dL (ref 8.4–10.5)
Chloride: 101 mEq/L (ref 96–112)
Creatinine, Ser: 1.22 mg/dL (ref 0.40–1.50)
GFR: 54.41 mL/min — ABNORMAL LOW (ref >60.00)
Glucose, Bld: 82 mg/dL (ref 70–99)
Potassium: 4 mEq/L (ref 3.5–5.1)
Sodium: 137 mEq/L (ref 135–145)

## 2022-08-03 LAB — HEPATIC FUNCTION PANEL
ALT: 28 U/L (ref 0–53)
AST: 34 U/L (ref 0–37)
Albumin: 4.6 g/dL (ref 3.5–5.2)
Alkaline Phosphatase: 60 U/L (ref 39–117)
Bilirubin, Direct: 0.2 mg/dL (ref 0.0–0.3)
Total Bilirubin: 0.7 mg/dL (ref 0.2–1.2)
Total Protein: 7.4 g/dL (ref 6.0–8.3)

## 2022-08-03 LAB — LIPID PANEL
Cholesterol: 105 mg/dL (ref 0–200)
HDL: 35.3 mg/dL — ABNORMAL LOW (ref >39.00)
LDL Cholesterol: 46 mg/dL (ref 0–99)
NonHDL: 69.89
Total CHOL/HDL Ratio: 3
Triglycerides: 120 mg/dL (ref 0.0–149.0)
VLDL: 24 mg/dL (ref 0.0–40.0)

## 2022-08-03 LAB — MICROALBUMIN / CREATININE URINE RATIO
Creatinine,U: 65.3 mg/dL
Microalb Creat Ratio: 4.2 mg/g (ref 0.0–30.0)
Microalb, Ur: 2.8 mg/dL — ABNORMAL HIGH (ref 0.0–1.9)

## 2022-08-03 NOTE — Progress Notes (Signed)
Established Patient Office Visit  Subjective   Patient ID: Kevin Wise, male    DOB: [birth year]  Age: 87 y.o. MRN: [id number]  Chief Complaint  Patient presents with   Medical Management of Chronic Issues    HPI   Mr. Gallaga is here for complete physical.  He has history of hypertension, type 2 diabetes, GERD, rheumatoid arthritis followed by rheumatology, hyperlipidemia.  Generally doing well.  His only real complaint is some chronic intermittent left lower back pain.  Previous imaging showed degenerative spondylosis at multiple levels.  No radiculitis symptoms.  No lower extremity weakness or numbness.  His blood sugars been well-controlled on metformin.  Health maintenance reviewed  -Aged out of further colonoscopies -Pneumonia vaccines complete -Shingles vaccine complete -Gets yearly diabetic eye exam  Family history-his father died age 13 presumably esophageal cancer.  Mother died age 57 and she had type 2 diabetes, hyperlipidemia, and reported Alzheimer's dementia.  He has a sister who is alive and well.  He had a brother died age 66 of unknown etiology.  Another brother died age 73 possibly congestive heart failure.  No known family history of premature CAD  Social history-married.  1 son.  2 grandchildren.  Retired from [employer].  Never smoked.  Only occasional beer use.  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   ARTHRITIS 01/06/2007   CAROTID ARTERY STENOSIS, RIGHT 01/10/2009   Diabetes mellitus without complication (HCC)    ERECTILE DYSFUNCTION 01/06/2007   HYPERGLYCEMIA, BORDERLINE 12/02/2009   HYPERLIPIDEMIA 01/06/2007   HYPERTENSION, BENIGN ESSENTIAL 01/06/2007   HYPOTHYROIDISM 01/10/2008   LIVER FUNCTION TESTS, ABNORMAL, HX OF 12/02/2009   Other symptoms involving cardiovascular system 01/10/2009   Rosacea 01/06/2007   UNS ADVRS EFF OTH RX MEDICINAL&BIOLOGICAL SBSTNC 01/10/2008   VITAMIN D DEFICIENCY 01/10/2008    Past Surgical History:  Procedure Laterality Date   HERNIA REPAIR     bilateral inguinal    TONSILLECTOMY      reports that he has never smoked. He has never used smokeless tobacco. He reports that he does not use drugs. No history on file for alcohol use. family history includes Diabetes in his mother; Hyperlipidemia in his mother. No Known Allergies  Review of Systems  Constitutional:  Negative for chills, fever, malaise/fatigue and weight loss.  HENT:  Negative for hearing loss.   Eyes:  Negative for blurred vision and double vision.  Respiratory:  Negative for cough and shortness of breath.   Cardiovascular:  Negative for chest pain, palpitations and leg swelling.  Gastrointestinal:  Negative for abdominal pain, blood in stool, constipation and diarrhea.  Genitourinary:  Negative for dysuria and frequency.  Musculoskeletal:  Positive for back pain.  Skin:  Negative for rash.  Neurological:  Negative for dizziness, speech change, seizures, loss of consciousness and headaches.  Psychiatric/Behavioral:  Negative for depression.       Objective:     BP 134/70 (BP Location: Left Arm, Patient Position: Sitting, Cuff Size: Normal)   Pulse 60   Temp 98.3 F (36.8 C) (Oral)   Ht 5\' 6"  (1.676 m)   Wt 167 lb 8 oz (76 kg)   SpO2 99%   BMI 27.04 kg/m  BP Readings from Last 3 Encounters:  08/03/22 134/70  03/02/22 124/70  02/17/22 138/72   Wt Readings from Last 3 Encounters:  08/03/22 167 lb 8 oz (76 kg)  03/02/22 168 lb 12.8 oz (76.6 kg)  02/17/22 172 lb 14.4 oz (  78.4 kg)      Physical Exam Vitals reviewed.  Constitutional:      General: He is not in acute distress.    Appearance: He is well-developed.  HENT:     Head: Normocephalic and atraumatic.     Right Ear: External ear normal.     Left Ear: External ear normal.  Eyes:     Conjunctiva/sclera: Conjunctivae normal.     Pupils: Pupils are equal, round, and reactive to light.  Neck:     Thyroid: No  thyromegaly.  Cardiovascular:     Rate and Rhythm: Normal rate and regular rhythm.     Heart sounds: Normal heart sounds. No murmur heard. Pulmonary:     Effort: No respiratory distress.     Breath sounds: No wheezing or rales.  Abdominal:     General: Bowel sounds are normal. There is no distension.     Palpations: Abdomen is soft. There is no mass.     Tenderness: There is no abdominal tenderness. There is no guarding or rebound.  Musculoskeletal:     Cervical back: Normal range of motion and neck supple.     Right lower leg: No edema.     Left lower leg: No edema.  Lymphadenopathy:     Cervical: No cervical adenopathy.  Skin:    Findings: No rash.     Comments: Feet reveal no skin lesions. Good distal foot pulses. Good capillary refill. No calluses. Normal sensation with monofilament testing   Neurological:     Mental Status: He is alert and oriented to person, place, and time.     Cranial Nerves: No cranial nerve deficit.      Results for orders placed or performed in visit on 08/03/22  POC HgB A1c  Result Value Ref Range   Hemoglobin A1C 6.6 (A) 4.0 - 5.6 %   HbA1c POC (<> result, manual entry)     HbA1c, POC (prediabetic range)     HbA1c, POC (controlled diabetic range)        The ASCVD Risk score (Arnett DK, et al., 2019) failed to calculate for the following reasons:   The 2019 ASCVD risk score is only valid for ages 73 to 61    Assessment & Plan:   Physical exam.  He has chronic problems including hyperlipidemia, hypertension, type 2 diabetes which been stable.  His A1c today is improved at 6.6%.  -Continue annual flu vaccine -Diabetic follow-up in 3 months -Other vaccines up-to-date with exception of tetanus.  He will check on insurance coverage -We did discuss PSA screening and advised against given his age and reviewed current guidelines. -Aged out of further colonoscopy screening -Check lipid and CMP with chronic statin use -Check urine  microalbumin -Continue diabetic eye exam annually   Return in about 3 months (around 11/03/2022).    Evelena Peat, MD

## 2022-09-11 ENCOUNTER — Telehealth: Payer: Self-pay | Admitting: Family Medicine

## 2022-09-11 DIAGNOSIS — H5213 Myopia, bilateral: Secondary | ICD-10-CM | POA: Diagnosis not present

## 2022-09-11 DIAGNOSIS — E119 Type 2 diabetes mellitus without complications: Secondary | ICD-10-CM | POA: Diagnosis not present

## 2022-09-11 DIAGNOSIS — H524 Presbyopia: Secondary | ICD-10-CM | POA: Diagnosis not present

## 2022-09-11 DIAGNOSIS — H25813 Combined forms of age-related cataract, bilateral: Secondary | ICD-10-CM | POA: Diagnosis not present

## 2022-09-11 DIAGNOSIS — H401132 Primary open-angle glaucoma, bilateral, moderate stage: Secondary | ICD-10-CM | POA: Diagnosis not present

## 2022-09-11 LAB — HM DIABETES EYE EXAM

## 2022-09-11 NOTE — Telephone Encounter (Signed)
Pt is calling and has a question to ask md he would like to know if there is  any reason why type 2 diabetic or pt that has rheumatoid arthritis should not have cataract surgery . Pt is referring to himself

## 2022-09-15 NOTE — Telephone Encounter (Signed)
Pt is calling back waiting on the response

## 2022-09-15 NOTE — Telephone Encounter (Signed)
Patient informed of the message below and voiced understanding  

## 2022-09-17 DIAGNOSIS — M0579 Rheumatoid arthritis with rheumatoid factor of multiple sites without organ or systems involvement: Secondary | ICD-10-CM | POA: Diagnosis not present

## 2022-09-30 ENCOUNTER — Telehealth: Payer: Self-pay | Admitting: Family Medicine

## 2022-09-30 MED ORDER — ATORVASTATIN CALCIUM 40 MG PO TABS: 40.0000 mg | ORAL_TABLET | Freq: Every day | ORAL | 3 refills | Status: DC

## 2022-09-30 NOTE — Telephone Encounter (Signed)
  Prescription Request  09/30/2022  LOV: 08/03/2022  What is the name of the medication or equipment?  atorvastatin atorvastatin (LIPITOR) 40 MG tablet  Have you contacted your pharmacy to request a refill? No   Which pharmacy would you like this sent to?   [id number] - [place], [place] - [address] Phone: [phone]  Fax: [phone]       Patient notified that their request is being sent to the clinical staff for review and that they should receive a response within 2 business days.   Please advise at Mobile [phone] (mobile)

## 2022-09-30 NOTE — Telephone Encounter (Signed)
Rx previously sent. 

## 2022-09-30 NOTE — Telephone Encounter (Signed)
Pt is requesting a 90 day supply.

## 2022-09-30 NOTE — Telephone Encounter (Signed)
Rx done. 

## 2022-11-03 ENCOUNTER — Encounter: Payer: Self-pay | Admitting: Family Medicine

## 2022-11-03 ENCOUNTER — Ambulatory Visit (INDEPENDENT_AMBULATORY_CARE_PROVIDER_SITE_OTHER): Payer: Medicare Other | Admitting: Family Medicine

## 2022-11-03 VITALS — BP 128/80 | HR 55 | Temp 98.1°F | Ht 66.0 in | Wt 167.6 lb

## 2022-11-03 DIAGNOSIS — M47812 Spondylosis without myelopathy or radiculopathy, cervical region: Secondary | ICD-10-CM

## 2022-11-03 DIAGNOSIS — I10 Essential (primary) hypertension: Secondary | ICD-10-CM | POA: Diagnosis not present

## 2022-11-03 DIAGNOSIS — Z7984 Long term (current) use of oral hypoglycemic drugs: Secondary | ICD-10-CM | POA: Diagnosis not present

## 2022-11-03 DIAGNOSIS — E1165 Type 2 diabetes mellitus with hyperglycemia: Secondary | ICD-10-CM

## 2022-11-03 DIAGNOSIS — E785 Hyperlipidemia, unspecified: Secondary | ICD-10-CM

## 2022-11-03 LAB — POCT GLYCOSYLATED HEMOGLOBIN (HGB A1C): Hemoglobin A1C: 6.6 % — AB (ref 4.0–5.6)

## 2022-11-03 MED ORDER — ACCU-CHEK SOFTCLIX LANCETS MISC: 3 refills | Status: AC

## 2022-11-03 MED ORDER — ACCU-CHEK GUIDE VI STRP: ORAL_STRIP | 3 refills | Status: DC

## 2022-11-03 NOTE — Patient Instructions (Addendum)
Remember flu vaccine later this Fall  A1C today is 6.6%= stable.    Let's plan on 3 month follow up.

## 2022-11-03 NOTE — Progress Notes (Signed)
Established Patient Office Visit  Subjective   Patient ID: Kevin Wise, male    DOB: 01-Dec-1935  Age: 87 y.o. MRN: [id number]  Chief Complaint  Patient presents with   Medical Management of Chronic Issues    HPI   Mr. Radel seen for medical follow-up.  He has hypertension, history of GERD, type 2 diabetes, hyperlipidemia, rheumatoid arthritis.  His rheumatoid is treated with low-dose methotrexate.  His eye exam is up-to-date but he does not have exact date with him at this time.  He states this is about 4 months ago.  Blood sugars have generally been very stable.  No recent polyuria or polydipsia.  Appetite and weight stable.  Compliant with all medications.  Remains on losartan 50 mg daily and amlodipine 5 mg daily.  Takes atorvastatin 40 mg daily for hyperlipidemia.  He is on metformin for lipids.  He has had some recent intermittent somewhat chronic left-sided neck pain.  No radiculitis symptoms.  No upper extremity numbness or weakness.  Takes Tylenol intermittently as needed which helps.  Previous x-rays from 2023 showed degenerative spondylosis with no acute changes.  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   ARTHRITIS 01/06/2007   CAROTID ARTERY STENOSIS, RIGHT 01/10/2009   Diabetes mellitus without complication (HCC)    ERECTILE DYSFUNCTION 01/06/2007   HYPERGLYCEMIA, BORDERLINE 12/02/2009   HYPERLIPIDEMIA 01/06/2007   HYPERTENSION, BENIGN ESSENTIAL 01/06/2007   HYPOTHYROIDISM 01/10/2008   LIVER FUNCTION TESTS, ABNORMAL, HX OF 12/02/2009   Other symptoms involving cardiovascular system 01/10/2009   Rosacea 01/06/2007   UNS ADVRS EFF OTH RX MEDICINAL&BIOLOGICAL SBSTNC 01/10/2008   VITAMIN D DEFICIENCY 01/10/2008   Past Surgical History:  Procedure Laterality Date   HERNIA REPAIR     bilateral inguinal    TONSILLECTOMY      reports that he has never smoked. He has never used smokeless tobacco. He reports that he does not use drugs. No history on  file for alcohol use. family history includes Diabetes in his mother; Hyperlipidemia in his mother. No Known Allergies  Review of Systems  Constitutional:  Negative for chills, fever and malaise/fatigue.  Eyes:  Negative for blurred vision.  Respiratory:  Negative for shortness of breath.   Cardiovascular:  Negative for chest pain.  Musculoskeletal:  Positive for neck pain.  Neurological:  Negative for dizziness, weakness and headaches.      Objective:     BP 128/80 (BP Location: Left Arm, Patient Position: Sitting, Cuff Size: Normal)   Pulse (!) 55   Temp 98.1 F (36.7 C) (Oral)   Ht 5\' 6"  (1.676 m)   Wt 167 lb 9.6 oz (76 kg)   SpO2 100%   BMI 27.05 kg/m  BP Readings from Last 3 Encounters:  11/03/22 128/80  08/03/22 134/70  03/02/22 124/70   Wt Readings from Last 3 Encounters:  11/03/22 167 lb 9.6 oz (76 kg)  08/03/22 167 lb 8 oz (76 kg)  03/02/22 168 lb 12.8 oz (76.6 kg)      Physical Exam Vitals reviewed.  Constitutional:      Appearance: He is well-developed.  Eyes:     Pupils: Pupils are equal, round, and reactive to light.  Neck:     Thyroid: No thyromegaly.  Cardiovascular:     Rate and Rhythm: Normal rate and regular rhythm.  Pulmonary:     Effort: Pulmonary effort is normal. No respiratory distress.     Breath sounds: Normal breath sounds. No wheezing or rales.  Musculoskeletal:     Cervical back: Neck supple.     Comments: No cervical spinal tenderness.  Neurological:     Mental Status: He is alert and oriented to person, place, and time.     Comments: No focal weakness involving upper extremities      Results for orders placed or performed in visit on 11/03/22  POC HgB A1c  Result Value Ref Range   Hemoglobin A1C 6.6 (A) 4.0 - 5.6 %   HbA1c POC (<> result, manual entry)     HbA1c, POC (prediabetic range)     HbA1c, POC (controlled diabetic range)      Last CBC Lab Results  Component Value Date   WBC 7.2 08/03/2022   HGB 11.9 (L)  08/03/2022   HCT 36.0 (L) 08/03/2022   MCV 97.2 08/03/2022   MCH 32.1 01/26/2020   RDW 14.5 08/03/2022   PLT 302.0 08/03/2022   Last metabolic panel Lab Results  Component Value Date   GLUCOSE 82 08/03/2022   NA 137 08/03/2022   K 4.0 08/03/2022   CL 101 08/03/2022   CO2 27 08/03/2022   BUN 13 08/03/2022   CREATININE 1.22 08/03/2022   GFR 54.41 (L) 08/03/2022   CALCIUM 9.7 08/03/2022   PHOS 2.7 12/02/2009   PROT 7.4 08/03/2022   ALBUMIN 4.6 08/03/2022   BILITOT 0.7 08/03/2022   ALKPHOS 60 08/03/2022   AST 34 08/03/2022   ALT 28 08/03/2022   Last lipids Lab Results  Component Value Date   CHOL 105 08/03/2022   HDL 35.30 (L) 08/03/2022   LDLCALC 46 08/03/2022   TRIG 120.0 08/03/2022   CHOLHDL 3 08/03/2022   Last hemoglobin A1c Lab Results  Component Value Date   HGBA1C 6.6 (A) 11/03/2022      The ASCVD Risk score (Arnett DK, et al., 2019) failed to calculate for the following reasons:   The 2019 ASCVD risk score is only valid for ages 36 to 90    Assessment & Plan:   #1 type 2 diabetes.  Remains well-controlled with A1c today 6.6%.  Continue metformin.  He states his diabetic eye exam is up-to-date. Foot exam today is unremarkable.  No concerning lesions.  Set up 33-month follow-up.  Printed refills today for his lancets and test strips  #2 hypertension stable and well-controlled on amlodipine and losartan.  Continue current medications.  Continue low-sodium diet  #3 hyperlipidemia treated with atorvastatin.  Reviewed recent lipids back in June which were well-controlled.  #4 intermittent left-sided neck pain.  Suspect related to cervical spondylosis.  Nonfocal neuroexam.  Continue Tylenol as needed  Return in about 3 months (around 02/02/2023).    Evelena Peat, MD

## 2022-12-22 ENCOUNTER — Other Ambulatory Visit: Payer: Self-pay | Admitting: Family Medicine

## 2022-12-25 NOTE — Telephone Encounter (Signed)
Pt called to F/U on this refill request  losartan (COZAAR) 50 MG tablet  Please send to:  [id number] - [place], [place] - [address] Phone: [phone]  Fax: [phone]     Also, Pt would like to know if he should get the RSV shot?  Please advise.

## 2022-12-25 NOTE — Telephone Encounter (Signed)
Rx done. 

## 2022-12-28 DIAGNOSIS — M0579 Rheumatoid arthritis with rheumatoid factor of multiple sites without organ or systems involvement: Secondary | ICD-10-CM | POA: Diagnosis not present

## 2022-12-28 DIAGNOSIS — M545 Low back pain, unspecified: Secondary | ICD-10-CM | POA: Diagnosis not present

## 2022-12-28 DIAGNOSIS — K760 Fatty (change of) liver, not elsewhere classified: Secondary | ICD-10-CM | POA: Diagnosis not present

## 2022-12-28 DIAGNOSIS — R5382 Chronic fatigue, unspecified: Secondary | ICD-10-CM | POA: Diagnosis not present

## 2023-01-18 DIAGNOSIS — H35033 Hypertensive retinopathy, bilateral: Secondary | ICD-10-CM | POA: Diagnosis not present

## 2023-01-18 DIAGNOSIS — H401132 Primary open-angle glaucoma, bilateral, moderate stage: Secondary | ICD-10-CM | POA: Diagnosis not present

## 2023-01-18 DIAGNOSIS — E119 Type 2 diabetes mellitus without complications: Secondary | ICD-10-CM | POA: Diagnosis not present

## 2023-01-18 DIAGNOSIS — H25813 Combined forms of age-related cataract, bilateral: Secondary | ICD-10-CM | POA: Diagnosis not present

## 2023-02-02 ENCOUNTER — Encounter: Payer: Self-pay | Admitting: Family Medicine

## 2023-02-02 ENCOUNTER — Ambulatory Visit: Payer: Medicare Other | Admitting: Family Medicine

## 2023-02-02 ENCOUNTER — Ambulatory Visit (INDEPENDENT_AMBULATORY_CARE_PROVIDER_SITE_OTHER): Payer: Medicare Other | Admitting: Family Medicine

## 2023-02-02 VITALS — BP 122/80 | HR 60 | Temp 98.4°F | Ht 66.0 in | Wt 170.1 lb

## 2023-02-02 DIAGNOSIS — E1165 Type 2 diabetes mellitus with hyperglycemia: Secondary | ICD-10-CM

## 2023-02-02 DIAGNOSIS — Z7984 Long term (current) use of oral hypoglycemic drugs: Secondary | ICD-10-CM

## 2023-02-02 DIAGNOSIS — Z0000 Encounter for general adult medical examination without abnormal findings: Secondary | ICD-10-CM | POA: Diagnosis not present

## 2023-02-02 DIAGNOSIS — I10 Essential (primary) hypertension: Secondary | ICD-10-CM

## 2023-02-02 DIAGNOSIS — R21 Rash and other nonspecific skin eruption: Secondary | ICD-10-CM

## 2023-02-02 DIAGNOSIS — E785 Hyperlipidemia, unspecified: Secondary | ICD-10-CM

## 2023-02-02 LAB — POCT GLYCOSYLATED HEMOGLOBIN (HGB A1C): Hemoglobin A1C: 6.8 % — AB (ref 4.0–5.6)

## 2023-02-02 MED ORDER — CLINDAMYCIN PHOSPHATE 1 % EX GEL: 1.0000 | CUTANEOUS | 3 refills | Status: AC | PRN

## 2023-02-02 NOTE — Progress Notes (Signed)
Established Patient Office Visit  Subjective   Patient ID: HEMINGWAY MCRILL, male    DOB: [birth year]  Age: 87 y.o. MRN: [id number]  No chief complaint on file.   HPI   Mr. Passero is here for medical follow-up.  He had Medicare wellness exam earlier today.  We discussed several items today's as follows  History of recurrent skin rash mostly forearms and extremities.  Had seen dermatologist previously and ended up going on clindamycin topical gel which is the only thing that has helped.  Was tried on multiple steroids without improvement.  Requesting refill the clindamycin today  He has questions regarding RSV vaccine.  He gets annual flu vaccine and also has been getting regular COVID vaccines.  Pneumonia vaccines complete.  Type 2 diabetes.  History of good control.  Remains on metformin.  No recent polyuria or polydipsia.  Appetite and weight stable.  Hypertension treated with amlodipine 5 mg daily and losartan 50 mg daily.  He takes Lipitor 40 mg daily.  His were checked in June and well-controlled with LDL cholesterol 46.  Last GFR 54.  Has had a birthday since last visit.  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   ARTHRITIS 01/06/2007   CAROTID ARTERY STENOSIS, RIGHT 01/10/2009   Diabetes mellitus without complication (HCC)    ERECTILE DYSFUNCTION 01/06/2007   HYPERGLYCEMIA, BORDERLINE 12/02/2009   HYPERLIPIDEMIA 01/06/2007   HYPERTENSION, BENIGN ESSENTIAL 01/06/2007   HYPOTHYROIDISM 01/10/2008   LIVER FUNCTION TESTS, ABNORMAL, HX OF 12/02/2009   Other symptoms involving cardiovascular system 01/10/2009   Rosacea 01/06/2007   UNS ADVRS EFF OTH RX MEDICINAL&BIOLOGICAL SBSTNC 01/10/2008   VITAMIN D DEFICIENCY 01/10/2008   Past Surgical History:  Procedure Laterality Date   HERNIA REPAIR     bilateral inguinal    TONSILLECTOMY      reports that he has never smoked. He has never  used smokeless tobacco. He reports that he does not use drugs. No history on file for alcohol use. family history includes Diabetes in his mother; Hyperlipidemia in his mother. No Known Allergies  Review of Systems  Constitutional:  Negative for malaise/fatigue and weight loss.  Eyes:  Negative for blurred vision.  Respiratory:  Negative for shortness of breath.   Cardiovascular:  Negative for chest pain.  Neurological:  Negative for dizziness, weakness and headaches.      Objective:     BP 122/80 (BP Location: Left Arm, Patient Position: Sitting, Cuff Size: Normal)   Pulse 60   Temp 98.4 F (36.9 C) (Oral)   Ht 5\' 6"  (1.676 m)   Wt 170 lb 1.6 oz (77.2 kg)   SpO2 98%   BMI 27.45 kg/m  BP Readings from Last 3 Encounters:  02/02/23 122/80  11/03/22 128/80  08/03/22 134/70   Wt Readings from Last 3 Encounters:  02/02/23 170 lb 1.6 oz (77.2 kg)  11/03/22 167 lb 9.6 oz (76 kg)  08/03/22 167 lb 8 oz (76 kg)      Physical Exam Vitals reviewed.  Constitutional:      Appearance: He is well-developed.  HENT:     Right Ear: External ear normal.     Left Ear: External ear normal.  Eyes:     Pupils: Pupils are equal, round, and reactive to light.  Neck:     Thyroid: No thyromegaly.  Cardiovascular:     Rate and Rhythm: Normal  rate and regular rhythm.  Pulmonary:     Effort: Pulmonary effort is normal. No respiratory distress.     Breath sounds: Normal breath sounds. No wheezing or rales.  Musculoskeletal:     Cervical back: Neck supple.  Skin:    Comments: Feet reveal no skin lesions. Good distal foot pulses. Good capillary refill. No calluses. Normal sensation with monofilament testing   Neurological:     Mental Status: He is alert and oriented to person, place, and time.      Results for orders placed or performed in visit on 02/02/23  POC HgB A1c  Result Value Ref Range   Hemoglobin A1C 6.8 (A) 4.0 - 5.6 %   HbA1c POC (<> result, manual entry)     HbA1c, POC  (prediabetic range)     HbA1c, POC (controlled diabetic range)        The ASCVD Risk score (Arnett DK, et al., 2019) failed to calculate for the following reasons:   The 2019 ASCVD risk score is only valid for ages 19 to 54    Assessment & Plan:   #1 type 2 diabetes stable with A1c 6.8%.  Continue metformin.  Continue yearly diabetic eye exam.  Continue yearly urine microalbumin screen.  #2 hypertension stable and at goal.  Continue losartan and amlodipine.  Continue low-sodium diet.  #3 hyperlipidemia well-controlled on atorvastatin 40 mg daily.  Last LDL 46.  #4 health maintenance.  Patient questions regarding RSV vaccine.  We have recommend he consider this based on recent guidelines suggesting RSV for all patients over 75 and high risk 60-74  #5 history of recurrent skin rash which has responded to topical clindamycin gel.  Refills provided  Routine follow-up in 3 months and sooner as needed  Return in about 3 months (around 05/03/2023).    Evelena Peat, MD

## 2023-02-02 NOTE — Patient Instructions (Signed)
I really enjoyed getting to talk with you today! I am available on Tuesdays and Thursdays for virtual visits if you have any questions or concerns, or if I can be of any further assistance.   CHECKLIST FROM ANNUAL WELLNESS VISIT:  -Follow up (please call to schedule if not scheduled after visit):   -yearly for annual wellness visit with primary care office  Here is a list of your preventive care/health maintenance measures and the plan for each if any are due:  PLAN For any measures below that may be due:  -can get the tetanus booster and the RSV vaccines at the pharmacy, please bring copy of receipt to Dr. [name]. -we are requesting a copy of your eye report for Dr. [name]  Health Maintenance  Topic Date Due   DTaP/Tdap/Td (2 - Td or Tdap) 04/17/2020   OPHTHALMOLOGY EXAM  09/18/2022   COVID-19 Vaccine (5 - 2023-24 season) 12/30/2022   INFLUENZA VACCINE  05/24/2023 (Originally 09/24/2022)   HEMOGLOBIN A1C  05/03/2023   Diabetic kidney evaluation - eGFR measurement  08/03/2023   Diabetic kidney evaluation - Urine ACR  08/03/2023   FOOT EXAM  08/03/2023   Medicare Annual Wellness (AWV)  02/02/2024   Pneumonia Vaccine 52+ Years old  Completed   Zoster Vaccines- Shingrix  Completed   HPV VACCINES  Aged Out    -See a dentist at least yearly  -Get your eyes checked and then per your eye specialist's recommendations  -Other issues addressed today:   -I have included below further information regarding a healthy whole foods based diet, physical activity guidelines for adults, stress management and opportunities for social connections. I hope you find this information useful.   -----------------------------------------------------------------------------------------------------------------------------------------------------------------------------------------------------------------------------------------------------------  NUTRITION: -eat real food: lots of colorful vegetables  (half the plate) and fruits -5-7 servings of vegetables and fruits per day (fresh or steamed is best), exp. 2 servings of vegetables with lunch and dinner and 2 servings of fruit per day. Berries and greens such as kale and collards are great choices.  -consume on a regular basis: whole grains (make sure first ingredient on label contains the word "whole"), fresh fruits, fish, nuts, seeds, healthy oils (such as olive oil, avocado oil, grape seed oil) -may eat small amounts of dairy and lean meat on occasion, but avoid processed meats such as ham, bacon, lunch meat, etc. -drink water -try to avoid fast food and pre-packaged foods, processed meat -most experts advise limiting sodium to < 2300mg  per day, should limit further is any chronic conditions such as high blood pressure, heart disease, diabetes, etc. The American Heart Association advised that < 1500mg  is is ideal -try to avoid foods that contain any ingredients with names you do not recognize  -try to avoid sugar/sweets (except for the natural sugar that occurs in fresh fruit) -try to avoid sweet drinks -try to avoid white rice, white bread, pasta (unless whole grain), white or yellow potatoes  EXERCISE GUIDELINES FOR ADULTS: -if you wish to increase your physical activity, do so gradually and with the approval of your doctor -STOP and seek medical care immediately if you have any chest pain, chest discomfort or trouble breathing when starting or increasing exercise  -move and stretch your body, legs, feet and arms when sitting for long periods -Physical activity guidelines for optimal health in adults: -least 150 minutes per week of aerobic exercise (can talk, but not sing) once approved by your doctor, 20-30 minutes of sustained activity or two 10 minute episodes of sustained  activity every day.  -resistance training at least 2 days per week if approved by your doctor -balance exercises 3+ days per week:   Stand somewhere where you have  something sturdy to hold onto if you lose balance.    1) lift up on toes, start with 5x per day and work up to 20x   2) stand and lift on leg straight out to the side so that foot is a few inches of the floor, start with 5x each side and work up to 20x each side   3) stand on one foot, start with 5 seconds each side and work up to 20 seconds on each side  If you need ideas or help with getting more active:  BuyDucts.dk  -Silver sneakers https://tools.silversneakers.com  -Walk with a Doc: http://www.duncan-williams.com/  -try to include resistance (weight lifting/strength building) and balance exercises twice per week: or the following link for ideas: http://castillo-powell.com/   -Walk with a Doc: http://www.duncan-williams.com/  -Check out the [facility] Active Adults 50+ section on the [place] [employer] (hiking clubs, book clubs, cards and games, chess, exercise classes, aquatic classes and much more) - see the website for details: https://www.[place].gov/departments/parks-recreation/active-adults50  -YouTube has lots of exercise videos for different ages and abilities as well  -[facility] (a variety of indoor and outdoor inperson activities for adults). [phone]. [address]     ADVANCED HEALTHCARE DIRECTIVES:  [place] Advanced Directives assistance:   ExpressWeek.com.cy  Everyone should have advanced health care directives in place. This is so that you get the care you want, should you ever be in a situation where you are unable to make your own medical decisions.   From the [place] Advanced Directive Website: "Advance Health Care Directives are legal documents in which you give written instructions about your health care if, in the future, you cannot speak for yourself.   A health care power of attorney  allows you to name a person you trust to make your health care decisions if you cannot make them yourself. A declaration of a desire for a natural death (or living will) is document, which states that you desire not to have your life prolonged by extraordinary measures if you have a terminal or incurable illness or if you are in a vegetative state. An advance instruction for mental health treatment makes a declaration of instructions, information and preferences regarding your mental health treatment. It also states that you are aware that the advance instruction authorizes a mental health treatment provider to act according to your wishes. It may also outline your consent or refusal of mental health treatment. A declaration of an anatomical gift allows anyone over the age of 81 to make a gift by will, organ donor card or other document."   Please see the following website or an elder law attorney for forms, FAQs and for completion of advanced directives: [place] Health Care Directives Advance Health Care Directives (http://guzman.com/)  Or copy and paste the following to your web browser: PoshChat.fi

## 2023-02-02 NOTE — Progress Notes (Signed)
PATIENT CHECK-IN and HEALTH RISK ASSESSMENT QUESTIONNAIRE:  -completed by phone/video for upcoming Medicare Preventive Visit   Pre-Visit Check-in: 1)Vitals (height, wt, BP, etc) - record in vitals section for visit on day of visit Request home vitals (wt, BP, etc.) and enter into vitals, THEN update Vital Signs SmartPhrase below at the top of the HPI. See below.  2)Review and Update Medications, Allergies PMH, Surgeries, Social history in Epic 3)Hospitalizations in the last year with date/reason? No   4)Review and Update Care Team (patient's specialists) in Epic 5) Complete PHQ9 in Epic  6) Complete Fall Screening in Epic 7)Review all Health Maintenance Due and order under PCP if not done.  Medicare Wellness Patient Questionnaire:  Answer theses question about your habits: How often do you have a drink containing alcohol?No  How many drinks containing alcohol do you have on a typical day when you are drinking?NA  How often do you have six or more drinks on one occasion?NA  Have you ever smoked?NO  Quit date if applicable? NA   How many packs a day do/did you smoke? NA  Do you use smokeless tobacco?NO  Do you use an illicit drugs?NO  On average, how many days per week do you engage in moderate to strenuous exercise (like a brisk walk)?walking, and treadmill, everyday  On average, how many minutes do you engage in exercise at this level?30-60 Minutes  Are you sexually active? Patient refused Number of partners?NA  Typical breakfast: fruit, cereal  Typical lunch: sandwich, chips and fruit Typical dinner:Meat and Vegetables , bread and soda  Typical snacks: nuts, chips, candy   Beverages: milk, Sugar free-soda   Answer theses question about your everyday activities: Can you perform most household chores?Yes  Are you deaf or have significant trouble hearing?NO  Do you feel that you have a problem with memory?NO  Do you feel safe at home?Yes  Last dentist visit?Nov 2024 8. Do you  have any difficulty performing your everyday activities?NO  Are you having any difficulty walking, taking medications on your own, and or difficulty managing daily home needs?NO  Do you have difficulty walking or climbing stairs?NO  Do you have difficulty dressing or bathing?NO  Do you have difficulty doing errands alone such as visiting a doctor's office or shopping?NO  Do you currently have any difficulty preparing food and eating?NO  Do you currently have any difficulty using the toilet?NO  Do you have any difficulty managing your finances?NO  Do you have any difficulties with housekeeping of managing your housekeeping?NO    Do you have Advanced Directives in place (Living Will, Healthcare Power or Attorney)? Patient refused    Last eye Exam and location?Nov 2024   Do you currently use prescribed or non-prescribed narcotic or opioid pain medications?NO   Do you have a history or close family history of breast, ovarian, tubal or peritoneal cancer or a family member with BRCA (breast cancer susceptibility 1 and 2) gene mutations?NO     Nurse/Assistant Credentials/time stamp:Leah A.Wright CMA 9:55 am    ----------------------------------------------------------------------------------------------------------------------------------------------------------------------------------------------------------------------  Because this visit was a virtual/telehealth visit, some criteria may be missing or patient reported. Any vitals not documented were not able to be obtained and vitals that have been documented are patient reported.    MEDICARE ANNUAL PREVENTIVE CARE VISIT WITH PROVIDER (Welcome to Medicare, initial annual wellness or annual wellness exam)  Virtual Visit via Phone Note  I connected with Kevin Wise on 02/02/23  by phone  and verified that I am  speaking with the correct person using two identifiers.  Location patient: home Location provider:work or home  office Persons participating in the virtual visit: patient, provider  Concerns and/or follow up today: He is seeing PCP later today. No concerns today.    See HM section in Epic for other details of completed HM.    ROS: negative for report of fevers, unintentional weight loss, vision changes, vision loss, hearing loss or change, chest pain, sob, hemoptysis, melena, hematochezia, hematuria, falls, bleeding or bruising, thoughts of suicide or self harm, memory loss  Patient-completed extensive health risk assessment - reviewed and discussed with the patient: See Health Risk Assessment completed with patient prior to the visit either above or in recent phone note. This was reviewed in detailed with the patient today and appropriate recommendations, orders and referrals were placed as needed per Summary below and patient instructions.   Review of Medical History: -PMH, PSH, Family History and current specialty and care providers reviewed and updated and listed below   Patient Care Team: Kristian Covey, MD as PCP - General (Family Medicine) Verner Chol, [facility] (Inactive) as Pharmacist (Pharmacist) Azucena Fallen, PA-C as Consulting Physician (Rheumatology)   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   ARTHRITIS 01/06/2007   CAROTID ARTERY STENOSIS, RIGHT 01/10/2009   Diabetes mellitus without complication (HCC)    ERECTILE DYSFUNCTION 01/06/2007   HYPERGLYCEMIA, BORDERLINE 12/02/2009   HYPERLIPIDEMIA 01/06/2007   HYPERTENSION, BENIGN ESSENTIAL 01/06/2007   HYPOTHYROIDISM 01/10/2008   LIVER FUNCTION TESTS, ABNORMAL, HX OF 12/02/2009   Other symptoms involving cardiovascular system 01/10/2009   Rosacea 01/06/2007   UNS ADVRS EFF OTH RX MEDICINAL&BIOLOGICAL SBSTNC 01/10/2008   VITAMIN D DEFICIENCY 01/10/2008    Past Surgical History:  Procedure Laterality Date   HERNIA REPAIR     bilateral inguinal    TONSILLECTOMY      Social History   Socioeconomic  History   Marital status: Married    Spouse name: Not on file   Number of children: Not on file   Years of education: Not on file   Highest education level: Not on file  Occupational History   Not on file  Tobacco Use   Smoking status: Never   Smokeless tobacco: Never  Vaping Use   Vaping status: Never Used  Substance and Sexual Activity   Alcohol use: Not on file   Drug use: No   Sexual activity: Not on file  Other Topics Concern   Not on file  Social History Narrative   Not on file   Social Determinants of Health   Financial Resource Strain: Low Risk  (01/28/2022)   Overall Financial Resource Strain (CARDIA)    Difficulty of Paying Living Expenses: Not hard at all  Food Insecurity: No Food Insecurity (01/28/2022)   Hunger Vital Sign    Worried About Running Out of Food in the Last Year: Never true    Ran Out of Food in the Last Year: Never true  Transportation Needs: No Transportation Needs (01/28/2022)   PRAPARE - Administrator, Civil Service (Medical): No    Lack of Transportation (Non-Medical): No  Physical Activity: Inactive (01/28/2022)   Exercise Vital Sign    Days of Exercise per Week: 0 days    Minutes of Exercise per Session: 0 min  Stress: No Stress Concern Present (01/28/2022)   [employer] of Occupational Health - Occupational Stress Questionnaire    Feeling of Stress : Not at all  Social Connections: Unknown (01/28/2022)   Social Connection and Isolation Panel [NHANES]    Frequency of Communication with Friends and Family: More than three times a week    Frequency of Social Gatherings with Friends and Family: More than three times a week    Attends Religious Services: More than 4 times per year    Active Member of [employer] or Organizations: Yes    Attends Banker Meetings: More than 4 times per year    Marital Status: Patient declined  Intimate Partner Violence: Not At Risk (01/28/2022)   Humiliation, Afraid, Rape, and Kick  questionnaire    Fear of Current or Ex-Partner: No    Emotionally Abused: No    Physically Abused: No    Sexually Abused: No    Family History  Problem Relation Age of Onset   Hyperlipidemia Mother    Diabetes Mother     Current Outpatient Medications on File Prior to Visit  Medication Sig Dispense Refill   Accu-Chek Softclix Lancets lancets USE AS DIRECTED TO CHECK BLOD SUGAR UP TO 4 TIMES A DAY 100 each 3   amLODipine (NORVASC) 5 MG tablet TAKE 1 TABLET (5 MG TOTAL) BY MOUTH DAILY. 90 tablet 0   atorvastatin (LIPITOR) 40 MG tablet Take 1 tablet (40 mg total) by mouth daily. 90 tablet 3   B-D TB SYRINGE 1CC/27GX1/2" 27G X 1/2" 1 ML MISC USE AS DIRECTED WITH METHOTREXATE     betamethasone dipropionate 0.05 % cream Apply 1 application topically 2 (two) times daily.     blood glucose meter kit and supplies Dispense based on patient and insurance preference. Use up to four times daily as directed. (FOR ICD-10 E10.9, E11.9). 1 each 0   clindamycin (CLINDAGEL) 1 % gel Apply 1 Application topically as needed. 60 g 11   diclofenac Sodium (VOLTAREN) 1 % GEL SMARTSIG:Gram(s) Topical Twice Daily PRN     fluticasone (FLONASE) 50 MCG/ACT nasal spray INSERT 2 SPRAYS IN EACH NOSTRIL EVERY DAY 48 g 1   folic acid (FOLVITE) 1 MG tablet Take 1 mg by mouth daily.     glucose blood (ACCU-CHEK GUIDE) test strip USE AS DIRECTED UP TO 4 TIMES A DAY AS DIRECTED TO CHECK BLOOD SUGAR 100 strip 3   hydrOXYzine (ATARAX/VISTARIL) 25 MG tablet Take 1 tablet (25 mg total) by mouth every 8 (eight) hours as needed. 90 tablet 3   latanoprost (XALATAN) 0.005 % ophthalmic solution PLACE 1 DROP IN BOTH EYES AT BEDTIME  3   losartan (COZAAR) 50 MG tablet TAKE 1 TABLET BY MOUTH EVERY DAY 90 tablet 3   metFORMIN (GLUCOPHAGE) 850 MG tablet TAKE 1 TABLET BY MOUTH TWICE A DAY W/ MEAL 180 tablet 3   methotrexate 50 MG/2ML injection INJECT 0.8ML WEEKLY     Potassium Gluconate 595 MG TBCR      timolol (TIMOPTIC) 0.5 % ophthalmic  solution Place 1 drop into both eyes 2 (two) times daily.     triamcinolone cream (KENALOG) 0.1 % APPLY 1 APPLICATION 2 TIMES DAILY 454 g 1   No current facility-administered medications on file prior to visit.    No Known Allergies     Physical Exam Vitals requested from patient and listed below if patient had equipment and was able to obtain at home for this virtual visit: There were no vitals filed for this visit. Estimated body mass index is 27.05 kg/m as calculated from the following:   Height as of 11/03/22: 5\' 6"  (1.676 m).  Weight as of 11/03/22: 167 lb 9.6 oz (76 kg).  EKG (optional): deferred due to virtual visit  GENERAL: alert, oriented, no acute distress detected; full vision exam deferred due to pandemic and/or virtual encounter  PSYCH/NEURO: pleasant and cooperative, no obvious depression or anxiety, speech and thought processing grossly intact, Cognitive function grossly intact  Flowsheet Row Clinical Support from 01/26/2020 in [facility]  PHQ-9 Total Score 0           02/02/2023    9:44 AM 01/28/2022   10:10 AM 12/01/2021    9:33 AM 01/27/2021    8:21 AM 01/26/2020    8:35 AM  Depression screen PHQ 2/9  Decreased Interest 0 0 0 0 0  Down, Depressed, Hopeless 0 0 0 0 0  PHQ - 2 Score 0 0 0 0 0  Altered sleeping     0  Tired, decreased energy     0  Change in appetite     0  Feeling bad or failure about yourself      0  Trouble concentrating     0  Moving slowly or fidgety/restless     0  Suicidal thoughts     0  PHQ-9 Score     0  Difficult doing work/chores     Not difficult at all       01/27/2021    8:24 AM 04/29/2021   12:03 AM 12/01/2021    9:33 AM 01/28/2022   10:11 AM 02/02/2023    9:44 AM  Fall Risk  Falls in the past year? 0  0 0 0  Was there an injury with Fall? 0  0 0 0  Fall Risk Category Calculator 0  0 0 0  Fall Risk Category (Retired) Low  Low Low   (RETIRED) Patient Fall Risk Level Low fall risk Low  fall risk Low fall risk Low fall risk   Patient at Risk for Falls Due to   No Fall Risks No Fall Risks No Fall Risks  Fall risk Follow up   Falls evaluation completed Falls prevention discussed Falls evaluation completed     SUMMARY AND PLAN:  Encounter for Medicare annual wellness exam   Discussed applicable health maintenance/preventive health measures and advised and referred or ordered per patient preferences: -discussed vaccines due and answered questions -asked staff to request eye report  Health Maintenance  Topic Date Due   DTaP/Tdap/Td (2 - Td or Tdap) 04/17/2020   OPHTHALMOLOGY EXAM  09/18/2022   COVID-19 Vaccine (5 - 2023-24 season) 12/30/2022   INFLUENZA VACCINE  05/24/2023 (Originally 09/24/2022)   HEMOGLOBIN A1C  05/03/2023   Diabetic kidney evaluation - eGFR measurement  08/03/2023   Diabetic kidney evaluation - Urine ACR  08/03/2023   FOOT EXAM  08/03/2023   Medicare Annual Wellness (AWV)  02/02/2024   Pneumonia Vaccine 48+ Years old  Completed   Zoster Vaccines- Shingrix  Completed   HPV VACCINES  Aged [employer] and counseling on the following was provided based on the above review of health and a plan/checklist for the patient, along with additional information discussed, was provided for the patient in the patient instructions :  -Advised on importance advanced directives, provided information in patient instructions as well -Advised and counseled on a healthy lifestyle - including the importance of a healthy diet, regular physical activity, social connections  -Reviewed patient's current diet. Advised and counseled on a whole foods based healthy diet. Specifically  discussed diet in terms of guideline based/evidenced based recommendations for his chronic conditions. A summary of a healthy diet was provided in the Patient Instructions.  -reviewed patient's current physical activity level and discussed exercise guidelines for adults. Discussed ideas for safe  exercise at home to assist in meeting exercise guideline recommendations in a safe and healthy way.  -Advise yearly dental visits at minimum and regular eye exams   Follow up: see patient instructions   Patient Instructions  I really enjoyed getting to talk with you today! I am available on Tuesdays and Thursdays for virtual visits if you have any questions or concerns, or if I can be of any further assistance.   CHECKLIST FROM ANNUAL WELLNESS VISIT:  -Follow up (please call to schedule if not scheduled after visit):   -yearly for annual wellness visit with primary care office  Here is a list of your preventive care/health maintenance measures and the plan for each if any are due:  PLAN For any measures below that may be due:  -can get the tetanus booster and the RSV vaccines at the pharmacy, please bring copy of receipt to Dr. [name]. -we are requesting a copy of your eye report for Dr. [name]  Health Maintenance  Topic Date Due   DTaP/Tdap/Td (2 - Td or Tdap) 04/17/2020   OPHTHALMOLOGY EXAM  09/18/2022   COVID-19 Vaccine (5 - 2023-24 season) 12/30/2022   INFLUENZA VACCINE  05/24/2023 (Originally 09/24/2022)   HEMOGLOBIN A1C  05/03/2023   Diabetic kidney evaluation - eGFR measurement  08/03/2023   Diabetic kidney evaluation - Urine ACR  08/03/2023   FOOT EXAM  08/03/2023   Medicare Annual Wellness (AWV)  02/02/2024   Pneumonia Vaccine 2+ Years old  Completed   Zoster Vaccines- Shingrix  Completed   HPV VACCINES  Aged Out    -See a dentist at least yearly  -Get your eyes checked and then per your eye specialist's recommendations  -Other issues addressed today:   -I have included below further information regarding a healthy whole foods based diet, physical activity guidelines for adults, stress management and opportunities for social connections. I hope you find this information useful.    -----------------------------------------------------------------------------------------------------------------------------------------------------------------------------------------------------------------------------------------------------------  NUTRITION: -eat real food: lots of colorful vegetables (half the plate) and fruits -5-7 servings of vegetables and fruits per day (fresh or steamed is best), exp. 2 servings of vegetables with lunch and dinner and 2 servings of fruit per day. Berries and greens such as kale and collards are great choices.  -consume on a regular basis: whole grains (make sure first ingredient on label contains the word "whole"), fresh fruits, fish, nuts, seeds, healthy oils (such as olive oil, avocado oil, grape seed oil) -may eat small amounts of dairy and lean meat on occasion, but avoid processed meats such as ham, bacon, lunch meat, etc. -drink water -try to avoid fast food and pre-packaged foods, processed meat -most experts advise limiting sodium to < 2300mg  per day, should limit further is any chronic conditions such as high blood pressure, heart disease, diabetes, etc. The American Heart Association advised that < 1500mg  is is ideal -try to avoid foods that contain any ingredients with names you do not recognize  -try to avoid sugar/sweets (except for the natural sugar that occurs in fresh fruit) -try to avoid sweet drinks -try to avoid white rice, white bread, pasta (unless whole grain), white or yellow potatoes  EXERCISE GUIDELINES FOR ADULTS: -if you wish to increase your physical activity, do so gradually  and with the approval of your doctor -STOP and seek medical care immediately if you have any chest pain, chest discomfort or trouble breathing when starting or increasing exercise  -move and stretch your body, legs, feet and arms when sitting for long periods -Physical activity guidelines for optimal health in adults: -least 150 minutes per week of  aerobic exercise (can talk, but not sing) once approved by your doctor, 20-30 minutes of sustained activity or two 10 minute episodes of sustained activity every day.  -resistance training at least 2 days per week if approved by your doctor -balance exercises 3+ days per week:   Stand somewhere where you have something sturdy to hold onto if you lose balance.    1) lift up on toes, start with 5x per day and work up to 20x   2) stand and lift on leg straight out to the side so that foot is a few inches of the floor, start with 5x each side and work up to 20x each side   3) stand on one foot, start with 5 seconds each side and work up to 20 seconds on each side  If you need ideas or help with getting more active:  BuyDucts.dk  -Silver sneakers https://tools.silversneakers.com  -Walk with a Doc: http://www.duncan-williams.com/  -try to include resistance (weight lifting/strength building) and balance exercises twice per week: or the following link for ideas: http://castillo-powell.com/   -Walk with a Doc: http://www.duncan-williams.com/  -Check out the [facility] Active Adults 50+ section on the [place] of [employer] (hiking clubs, book clubs, cards and games, chess, exercise classes, aquatic classes and much more) - see the website for details: https://www.[place].gov/departments/parks-recreation/active-adults50  -YouTube has lots of exercise videos for different ages and abilities as well  -[facility] (a variety of indoor and outdoor inperson activities for adults). [phone]. [address]     ADVANCED HEALTHCARE DIRECTIVES:  [place] Advanced Directives assistance:   ExpressWeek.com.cy  Everyone should have advanced health care directives in place. This is so that you get the care you want, should you ever be in a  situation where you are unable to make your own medical decisions.   From the [place] Advanced Directive Website: "Advance Health Care Directives are legal documents in which you give written instructions about your health care if, in the future, you cannot speak for yourself.   A health care power of attorney allows you to name a person you trust to make your health care decisions if you cannot make them yourself. A declaration of a desire for a natural death (or living will) is document, which states that you desire not to have your life prolonged by extraordinary measures if you have a terminal or incurable illness or if you are in a vegetative state. An advance instruction for mental health treatment makes a declaration of instructions, information and preferences regarding your mental health treatment. It also states that you are aware that the advance instruction authorizes a mental health treatment provider to act according to your wishes. It may also outline your consent or refusal of mental health treatment. A declaration of an anatomical gift allows anyone over the age of 66 to make a gift by will, organ donor card or other document."   Please see the following website or an elder law attorney for forms, FAQs and for completion of advanced directives: [place] Health Care Directives Advance Health Care Directives (http://guzman.com/)  Or copy and paste the following to  your web browser: PoshChat.fi    Terressa Koyanagi, DO

## 2023-02-02 NOTE — Progress Notes (Signed)
"  Patient was unable to self-report due to a lack of equipment at home via telehealth"

## 2023-02-02 NOTE — Patient Instructions (Signed)
A1C today is 6.8%  Consider RSV vaccine.

## 2023-03-30 DIAGNOSIS — M0579 Rheumatoid arthritis with rheumatoid factor of multiple sites without organ or systems involvement: Secondary | ICD-10-CM | POA: Diagnosis not present

## 2023-04-05 ENCOUNTER — Ambulatory Visit: Payer: Self-pay | Admitting: Family Medicine

## 2023-04-05 MED ORDER — BLOOD GLUCOSE METER KIT: PACK | 0 refills | Status: AC

## 2023-04-05 NOTE — Telephone Encounter (Signed)
 I spoke with the patient and he reported he has been having fluctuations on blood glucose device and requested a new rx be called in for him. RX sent and patient advised to keep track of Glucose and to follow up with any abnormal readings.

## 2023-04-05 NOTE — Addendum Note (Signed)
 Addended by: Aurelio Leer on: 04/05/2023 01:16 PM   Modules accepted: Orders

## 2023-04-05 NOTE — Telephone Encounter (Signed)
 Copied From CRM [id number]. Reason for Triage: Patient tested his blood sugar and its been fluctuating the last few days, he isn't sure if its due to the glucose monitor but he would like for a nurse to call back

## 2023-04-05 NOTE — Telephone Encounter (Signed)
 This Triage RN Attempted to call patient, no answer at this time. Unable to leave voicemail. "Call could not be completed as dialed."

## 2023-04-05 NOTE — Telephone Encounter (Signed)
 This RN made second attempt to triage. Call could not be completed as dialed. Will continue to attempt.

## 2023-04-30 ENCOUNTER — Other Ambulatory Visit: Payer: Self-pay | Admitting: Family Medicine

## 2023-05-03 ENCOUNTER — Ambulatory Visit (INDEPENDENT_AMBULATORY_CARE_PROVIDER_SITE_OTHER): Payer: Medicare Other | Admitting: Family Medicine

## 2023-05-03 ENCOUNTER — Encounter: Payer: Self-pay | Admitting: Family Medicine

## 2023-05-03 VITALS — BP 138/78 | HR 62 | Temp 97.9°F | Wt 171.3 lb

## 2023-05-03 DIAGNOSIS — M545 Low back pain, unspecified: Secondary | ICD-10-CM | POA: Diagnosis not present

## 2023-05-03 DIAGNOSIS — M0579 Rheumatoid arthritis with rheumatoid factor of multiple sites without organ or systems involvement: Secondary | ICD-10-CM | POA: Diagnosis not present

## 2023-05-03 DIAGNOSIS — E1165 Type 2 diabetes mellitus with hyperglycemia: Secondary | ICD-10-CM

## 2023-05-03 DIAGNOSIS — R5382 Chronic fatigue, unspecified: Secondary | ICD-10-CM | POA: Diagnosis not present

## 2023-05-03 DIAGNOSIS — E785 Hyperlipidemia, unspecified: Secondary | ICD-10-CM

## 2023-05-03 DIAGNOSIS — Z7984 Long term (current) use of oral hypoglycemic drugs: Secondary | ICD-10-CM | POA: Diagnosis not present

## 2023-05-03 DIAGNOSIS — I10 Essential (primary) hypertension: Secondary | ICD-10-CM

## 2023-05-03 DIAGNOSIS — K760 Fatty (change of) liver, not elsewhere classified: Secondary | ICD-10-CM | POA: Diagnosis not present

## 2023-05-03 LAB — POCT GLYCOSYLATED HEMOGLOBIN (HGB A1C): Hemoglobin A1C: 6.9 % — AB (ref 4.0–5.6)

## 2023-05-03 NOTE — Patient Instructions (Addendum)
 A1C today was 6.9%  Set up physical for next visit.

## 2023-05-03 NOTE — Progress Notes (Signed)
 Established Patient Office Visit  Subjective   Patient ID: Kevin Wise, male    DOB: 06/03/1935  Age: 88 y.o. MRN: [id number]  Chief Complaint  Patient presents with   Medical Management of Chronic Issues    HPI   Kevin Wise is here for medical follow-up.  He has history of type 2 diabetes, hyperlipidemia, hypertension, GERD, rheumatoid arthritis.  He is followed by rheumatologist and is on methotrexate.  Recently had some issues with trigger finger which have been more of a nuisance.  His blood sugars generally well-controlled.  Takes metformin twice daily.  Last A1c 6.8%.  Not monitoring blood sugars regularly.  Hypertension treated with losartan 50 mg daily and amlodipine 5 mg daily.  He has home blood pressure cuff but not checking blood pressures recently.  He remains on atorvastatin 40 mg daily for hyperlipidemia.  Denies any recent chest pains.  No falls.  No dizziness.  Generally feels well.  His son who is a Clinical research associate was just hired as a Art therapist for sports programs at [employer]  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   ARTHRITIS 01/06/2007   CAROTID ARTERY STENOSIS, RIGHT 01/10/2009   Diabetes mellitus without complication (HCC)    ERECTILE DYSFUNCTION 01/06/2007   HYPERGLYCEMIA, BORDERLINE 12/02/2009   HYPERLIPIDEMIA 01/06/2007   HYPERTENSION, BENIGN ESSENTIAL 01/06/2007   HYPOTHYROIDISM 01/10/2008   LIVER FUNCTION TESTS, ABNORMAL, HX OF 12/02/2009   Other symptoms involving cardiovascular system 01/10/2009   Rosacea 01/06/2007   UNS ADVRS EFF OTH RX MEDICINAL&BIOLOGICAL SBSTNC 01/10/2008   VITAMIN D DEFICIENCY 01/10/2008   Past Surgical History:  Procedure Laterality Date   HERNIA REPAIR     bilateral inguinal    TONSILLECTOMY      reports that he has never smoked. He has never used smokeless tobacco. He reports that he does not use drugs. No history on file for alcohol use. family history includes Diabetes in his mother;  Hyperlipidemia in his mother. No Known Allergies  Review of Systems  Constitutional:  Negative for malaise/fatigue.  Eyes:  Negative for blurred vision.  Respiratory:  Negative for shortness of breath.   Cardiovascular:  Negative for chest pain.  Gastrointestinal:  Negative for abdominal pain.  Genitourinary:  Negative for dysuria.  Neurological:  Negative for dizziness, weakness and headaches.      Objective:     BP 138/78 (BP Location: Left Arm, Cuff Size: Normal)   Pulse 62   Temp 97.9 F (36.6 C) (Oral)   Wt 171 lb 4.8 oz (77.7 kg)   SpO2 96%   BMI 27.65 kg/m  BP Readings from Last 3 Encounters:  05/03/23 138/78  02/02/23 122/80  11/03/22 128/80   Wt Readings from Last 3 Encounters:  05/03/23 171 lb 4.8 oz (77.7 kg)  02/02/23 170 lb 1.6 oz (77.2 kg)  11/03/22 167 lb 9.6 oz (76 kg)      Physical Exam Vitals reviewed.  Constitutional:      General: He is not in acute distress.    Appearance: He is well-developed. He is not ill-appearing.  Eyes:     Pupils: Pupils are equal, round, and reactive to light.  Neck:     Thyroid: No thyromegaly.  Cardiovascular:     Rate and Rhythm: Normal rate and regular rhythm.  Pulmonary:     Effort: Pulmonary effort is normal. No respiratory distress.     Breath sounds: Normal breath sounds. No wheezing or rales.  Musculoskeletal:  Cervical back: Neck supple.  Neurological:     Mental Status: He is alert and oriented to person, place, and time.      Results for orders placed or performed in visit on 05/03/23  POC HgB A1c  Result Value Ref Range   Hemoglobin A1C 6.9 (A) 4.0 - 5.6 %   HbA1c POC (<> result, manual entry)     HbA1c, POC (prediabetic range)     HbA1c, POC (controlled diabetic range)      Last CBC Lab Results  Component Value Date   WBC 7.2 08/03/2022   HGB 11.9 (L) 08/03/2022   HCT 36.0 (L) 08/03/2022   MCV 97.2 08/03/2022   MCH 32.1 01/26/2020   RDW 14.5 08/03/2022   PLT 302.0 08/03/2022    Last metabolic panel Lab Results  Component Value Date   GLUCOSE 82 08/03/2022   NA 137 08/03/2022   K 4.0 08/03/2022   CL 101 08/03/2022   CO2 27 08/03/2022   BUN 13 08/03/2022   CREATININE 1.22 08/03/2022   GFR 54.41 (L) 08/03/2022   CALCIUM 9.7 08/03/2022   PHOS 2.7 12/02/2009   PROT 7.4 08/03/2022   ALBUMIN 4.6 08/03/2022   BILITOT 0.7 08/03/2022   ALKPHOS 60 08/03/2022   AST 34 08/03/2022   ALT 28 08/03/2022   Last lipids Lab Results  Component Value Date   CHOL 105 08/03/2022   HDL 35.30 (L) 08/03/2022   LDLCALC 46 08/03/2022   TRIG 120.0 08/03/2022   CHOLHDL 3 08/03/2022   Last hemoglobin A1c Lab Results  Component Value Date   HGBA1C 6.9 (A) 05/03/2023      The ASCVD Risk score (Arnett DK, et al., 2019) failed to calculate for the following reasons:   The 2019 ASCVD risk score is only valid for ages 62 to 76    Assessment & Plan:   #1 type 2 diabetes stable with A1c today 6.9%.  He is getting regular eye exams.  Urine microalbumin was checked in June.  Will recheck at follow-up in 3 months.  Continue current regimen metformin 850 mg twice daily  #2 hypertension.  Initial reading up slightly today but improved some with rest.  Continue amlodipine and losartan.  We recommend he be monitoring this more closely over the next couple months and bring back log of readings at follow-up visit.  If consistently over 130 that point consider further titration of losartan or perhaps more potent ARB such as Micardis  #3 hyperlipidemia treated with atorvastatin 40 mg daily.  No myalgias.  Recheck fasting lipid and CMP at follow-up   Return in about 3 months (around 08/03/2023).    Evelena Peat, MD

## 2023-05-24 DIAGNOSIS — E119 Type 2 diabetes mellitus without complications: Secondary | ICD-10-CM | POA: Diagnosis not present

## 2023-05-24 DIAGNOSIS — H25813 Combined forms of age-related cataract, bilateral: Secondary | ICD-10-CM | POA: Diagnosis not present

## 2023-05-24 DIAGNOSIS — H35033 Hypertensive retinopathy, bilateral: Secondary | ICD-10-CM | POA: Diagnosis not present

## 2023-05-24 DIAGNOSIS — H5213 Myopia, bilateral: Secondary | ICD-10-CM | POA: Diagnosis not present

## 2023-05-24 DIAGNOSIS — H401132 Primary open-angle glaucoma, bilateral, moderate stage: Secondary | ICD-10-CM | POA: Diagnosis not present

## 2023-05-24 DIAGNOSIS — H524 Presbyopia: Secondary | ICD-10-CM | POA: Diagnosis not present

## 2023-05-24 LAB — HM DIABETES EYE EXAM

## 2023-06-07 ENCOUNTER — Other Ambulatory Visit: Payer: Self-pay | Admitting: Family Medicine

## 2023-06-07 ENCOUNTER — Telehealth: Payer: Self-pay

## 2023-06-07 DIAGNOSIS — M6530 Trigger finger, unspecified finger: Secondary | ICD-10-CM

## 2023-06-07 NOTE — Telephone Encounter (Signed)
 Copied from CRM [id number]. Topic: General - Other >> Jun 07, 2023  8:11 AM Oddis Bench wrote: Reason for CRM: Patient is calling to  let Mikel know state still having issue with left hand states it is hurting all the time and that several fingers are developing trigger finger. He is wanting to know if there is a hand specialist in [place] or [place] he can be reffered to. The patient would  like to have a call back.

## 2023-06-09 ENCOUNTER — Telehealth: Payer: Self-pay | Admitting: Family Medicine

## 2023-06-09 NOTE — Telephone Encounter (Signed)
 Copied from CRM [id number]. Topic: General - Other >> Jun 09, 2023 11:48 AM Arm[place] J wrote: Reason for CRM: Patient is wanting to speak with Kristeen Peto at the front office. I let him know that she is on "break" but patient would not allow me to help or go into details about what he needs help with. Patient stated that he will be waiting for [facility] to call back.

## 2023-06-09 NOTE — Telephone Encounter (Signed)
 Called and LVM letting pt know that whatever he needs he can speak with the agents and leave a message with them. The agents he speaks with are an extension of the office.   Copied from CRM [id number]. Topic: General - Other >> Jun 09, 2023 11:48 AM [place] J wrote: Reason for CRM: Patient is wanting to speak with Kristeen Peto at the front office. I let him know that she is on "break" but patient would not allow me to help or go into details about what he needs help with. Patient stated that he will be waiting for [facility] to call back.

## 2023-06-10 NOTE — Telephone Encounter (Signed)
 Pt called back to check on the status of getting a referral. He doesn't understand why it is taking so long for a call back. Let pt know that Dr. Darren Em has been out of the office this week pt stated well if he was told that then he would've waited.   Let pt know that Dr comes back on Monday. Pt understood.

## 2023-06-14 NOTE — Addendum Note (Signed)
 Addended by: Aurelio Leer on: 06/14/2023 08:56 AM   Modules accepted: Orders

## 2023-06-14 NOTE — Telephone Encounter (Signed)
 Left a message for the patient to return my cal. Referral placed.

## 2023-06-15 ENCOUNTER — Telehealth: Payer: Self-pay

## 2023-06-15 NOTE — Telephone Encounter (Signed)
 Copied from CRM [id number]. Topic: General - Other >> Jun 15, 2023 10:58 AM Albertha Alosa wrote: Reason for CRM: Patient called in wanting to speak with Mykal regarding a missed call would like a callback

## 2023-06-16 ENCOUNTER — Telehealth: Payer: Self-pay

## 2023-06-16 NOTE — Telephone Encounter (Signed)
 Please see previous encounter

## 2023-06-16 NOTE — Telephone Encounter (Signed)
 Patient aware.

## 2023-06-16 NOTE — Telephone Encounter (Signed)
 Please see previous note.

## 2023-06-16 NOTE — Telephone Encounter (Signed)
 Copied from CRM [id number]. Topic: Referral - Question >> Jun 16, 2023  8:16 AM Ovid Blow wrote: Reason for CRM: Patient called in wanting to speak with Mykal about a referral to a hand specialist. Please vm if patient does not answer

## 2023-06-21 DIAGNOSIS — M65332 Trigger finger, left middle finger: Secondary | ICD-10-CM | POA: Diagnosis not present

## 2023-06-21 DIAGNOSIS — M65312 Trigger thumb, left thumb: Secondary | ICD-10-CM | POA: Diagnosis not present

## 2023-06-21 DIAGNOSIS — M65342 Trigger finger, left ring finger: Secondary | ICD-10-CM | POA: Diagnosis not present

## 2023-06-30 ENCOUNTER — Other Ambulatory Visit: Payer: Self-pay | Admitting: Family Medicine

## 2023-06-30 DIAGNOSIS — M0579 Rheumatoid arthritis with rheumatoid factor of multiple sites without organ or systems involvement: Secondary | ICD-10-CM | POA: Diagnosis not present

## 2023-06-30 DIAGNOSIS — M65322 Trigger finger, left index finger: Secondary | ICD-10-CM | POA: Diagnosis not present

## 2023-06-30 DIAGNOSIS — K760 Fatty (change of) liver, not elsewhere classified: Secondary | ICD-10-CM | POA: Diagnosis not present

## 2023-06-30 DIAGNOSIS — R5382 Chronic fatigue, unspecified: Secondary | ICD-10-CM | POA: Diagnosis not present

## 2023-07-01 ENCOUNTER — Other Ambulatory Visit: Payer: Self-pay | Admitting: Family Medicine

## 2023-07-01 LAB — LAB REPORT - SCANNED: EGFR: 45

## 2023-07-17 ENCOUNTER — Other Ambulatory Visit: Payer: Self-pay | Admitting: Family Medicine

## 2023-07-21 DIAGNOSIS — M65332 Trigger finger, left middle finger: Secondary | ICD-10-CM | POA: Diagnosis not present

## 2023-07-21 DIAGNOSIS — M65312 Trigger thumb, left thumb: Secondary | ICD-10-CM | POA: Diagnosis not present

## 2023-07-21 DIAGNOSIS — M65342 Trigger finger, left ring finger: Secondary | ICD-10-CM | POA: Diagnosis not present

## 2023-08-04 ENCOUNTER — Encounter: Payer: Self-pay | Admitting: Family Medicine

## 2023-08-04 ENCOUNTER — Ambulatory Visit (INDEPENDENT_AMBULATORY_CARE_PROVIDER_SITE_OTHER): Admitting: Family Medicine

## 2023-08-04 VITALS — BP 122/64 | HR 57 | Temp 97.8°F | Ht 66.54 in | Wt 164.6 lb

## 2023-08-04 DIAGNOSIS — E785 Hyperlipidemia, unspecified: Secondary | ICD-10-CM

## 2023-08-04 DIAGNOSIS — Z0000 Encounter for general adult medical examination without abnormal findings: Secondary | ICD-10-CM

## 2023-08-04 DIAGNOSIS — M069 Rheumatoid arthritis, unspecified: Secondary | ICD-10-CM | POA: Diagnosis not present

## 2023-08-04 DIAGNOSIS — I10 Essential (primary) hypertension: Secondary | ICD-10-CM

## 2023-08-04 DIAGNOSIS — E1165 Type 2 diabetes mellitus with hyperglycemia: Secondary | ICD-10-CM

## 2023-08-04 NOTE — Progress Notes (Signed)
 Established Patient Office Visit  Subjective   Patient ID: Kevin Wise, male    DOB: 1935/08/06  Age: 88 y.o. MRN: [id number]  Chief Complaint  Patient presents with   Annual Exam    HPI   Kevin Wise is here for physical exam.  He has chronic problems including hypertension, type 2 diabetes, history of GERD, rheumatoid arthritis, glaucoma, hyperlipidemia.  Generally doing well.  Arthritis is stable.  Weight is down about 6 to 7 pounds from last visit.  He states he has been scaling back his sweets and he thinks this (weight loss) is related.  Appetite is good.  Medications reviewed and compliant with all.  Has generally had very well-controlled blood sugars.  Remains on cholesterol medication regularly.  No significant myalgias.  Health maintenance reviewed:  Health Maintenance  Topic Date Due   COVID-19 Vaccine (5 - 2024-25 season) 12/30/2022   Diabetic kidney evaluation - Urine ACR  08/03/2023   FOOT EXAM  08/03/2023   DTaP/Tdap/Td (2 - Td or Tdap) 08/03/2024 (Originally 04/17/2020)   INFLUENZA VACCINE  09/24/2023   HEMOGLOBIN A1C  11/03/2023   Medicare Annual Wellness (AWV)  02/02/2024   OPHTHALMOLOGY EXAM  05/23/2024   Diabetic kidney evaluation - eGFR measurement  06/30/2024   Pneumonia Vaccine 60+ Years old  Completed   Zoster Vaccines- Shingrix  Completed   HPV VACCINES  Aged Out   Meningococcal B Vaccine  Aged Out   - He also states he has had RSV vaccine. -Aged out of further colonoscopies  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   ARTHRITIS 01/06/2007   CAROTID ARTERY STENOSIS, RIGHT 01/10/2009   Diabetes mellitus without complication (HCC)    ERECTILE DYSFUNCTION 01/06/2007   HYPERGLYCEMIA, BORDERLINE 12/02/2009   HYPERLIPIDEMIA 01/06/2007   HYPERTENSION, BENIGN ESSENTIAL 01/06/2007   HYPOTHYROIDISM 01/10/2008   LIVER FUNCTION TESTS, ABNORMAL, HX OF 12/02/2009   Other symptoms involving cardiovascular system 01/10/2009   Rosacea  01/06/2007   UNS ADVRS EFF OTH RX MEDICINAL&BIOLOGICAL SBSTNC 01/10/2008   VITAMIN D  DEFICIENCY 01/10/2008   Past Surgical History:  Procedure Laterality Date   HERNIA REPAIR     bilateral inguinal    TONSILLECTOMY      reports that he has never smoked. He has never used smokeless tobacco. He reports that he does not use drugs. No history on file for alcohol use. family history includes Diabetes in his mother; Hyperlipidemia in his mother. No Known Allergies]  Review of Systems  Constitutional:  Positive for weight loss. Negative for chills, fever and malaise/fatigue.  HENT:  Negative for hearing loss.   Eyes:  Negative for blurred vision and double vision.  Respiratory:  Negative for cough and shortness of breath.   Cardiovascular:  Negative for chest pain, palpitations and leg swelling.  Gastrointestinal:  Negative for abdominal pain, blood in stool, constipation, diarrhea, melena, nausea and vomiting.  Genitourinary:  Negative for dysuria.  Skin:  Negative for rash.  Neurological:  Negative for dizziness, speech change, seizures, loss of consciousness and headaches.  Psychiatric/Behavioral:  Negative for depression.       Objective:     BP 122/64 (BP Location: Left Arm, Patient Position: Sitting, Cuff Size: Normal)   Pulse (!) 57   Temp 97.8 F (36.6 C) (Oral)   Ht 5' 6.54 (1.69 m)   Wt 164 lb 9.6 oz (74.7 kg)   SpO2 98%   BMI 26.14 kg/m  BP Readings from Last 3 Encounters:  08/04/23  122/64  05/03/23 138/78  02/02/23 122/80   Wt Readings from Last 3 Encounters:  08/04/23 164 lb 9.6 oz (74.7 kg)  05/03/23 171 lb 4.8 oz (77.7 kg)  02/02/23 170 lb 1.6 oz (77.2 kg)      Physical Exam Vitals reviewed.  Constitutional:      Appearance: He is well-developed.  HENT:     Right Ear: External ear normal.     Left Ear: External ear normal.  Eyes:     Pupils: Pupils are equal, round, and reactive to light.  Neck:     Thyroid : No thyromegaly.  Cardiovascular:      Rate and Rhythm: Normal rate and regular rhythm.  Pulmonary:     Effort: Pulmonary effort is normal. No respiratory distress.     Breath sounds: Normal breath sounds. No wheezing or rales.  Abdominal:     Palpations: Abdomen is soft.     Tenderness: There is no abdominal tenderness. There is no guarding or rebound.     Comments: No hepatomegaly or splenomegaly noted  Musculoskeletal:     Cervical back: Neck supple.  Neurological:     Mental Status: He is alert and oriented to person, place, and time.      No results found for any visits on 08/04/23.  Last CBC Lab Results  Component Value Date   WBC 7.2 08/03/2022   HGB 11.9 (L) 08/03/2022   HCT 36.0 (L) 08/03/2022   MCV 97.2 08/03/2022   MCH 32.1 01/26/2020   RDW 14.5 08/03/2022   PLT 302.0 08/03/2022   Last metabolic panel Lab Results  Component Value Date   GLUCOSE 82 08/03/2022   NA 137 08/03/2022   K 4.0 08/03/2022   CL 101 08/03/2022   CO2 27 08/03/2022   BUN 13 08/03/2022   CREATININE 1.22 08/03/2022   EGFR 45.0 07/01/2023   CALCIUM  9.7 08/03/2022   PHOS 2.7 12/02/2009   PROT 7.4 08/03/2022   ALBUMIN 4.6 08/03/2022   BILITOT 0.7 08/03/2022   ALKPHOS 60 08/03/2022   AST 34 08/03/2022   ALT 28 08/03/2022   Last lipids Lab Results  Component Value Date   CHOL 105 08/03/2022   HDL 35.30 (L) 08/03/2022   LDLCALC 46 08/03/2022   TRIG 120.0 08/03/2022   CHOLHDL 3 08/03/2022   Last hemoglobin A1c Lab Results  Component Value Date   HGBA1C 6.9 (A) 05/03/2023   Last thyroid  functions Lab Results  Component Value Date   TSH 1.31 04/28/2021      The ASCVD Risk score (Arnett DK, et al., 2019) failed to calculate for the following reasons:   The 2019 ASCVD risk score is only valid for ages 83 to 25    Assessment & Plan:   Problem List Items Addressed This Visit       Unprioritized   HTN (hypertension)   Rheumatoid arthritis (HCC)   Relevant Orders   CBC with Differential/Platelet    Hyperlipidemia   Relevant Orders   Lipid panel   CMP   Type 2 diabetes mellitus (HCC)   Relevant Orders   Hemoglobin A1c   Microalbumin / creatinine urine ratio   Other Visit Diagnoses       Physical exam    -  Primary     88 year old male here for physical exam.  Chronic medical problems as above stable.  Obtain follow-up labs as above.  We discussed additional following health maintenance items:   - Continue annual flu vaccine - Aged out  of further colonoscopy - He had questions regarding PSA screening we recommended against this given his age - Monitor weight closely and be in touch if continues to go down any.  He will liberalize his carb intake slightly - RSV, pneumonia vaccine, Shingrix all up-to-date  No follow-ups on file.    Glean Lamy, MD

## 2023-08-05 ENCOUNTER — Ambulatory Visit: Payer: Self-pay | Admitting: Family Medicine

## 2023-08-05 LAB — CBC WITH DIFFERENTIAL/PLATELET
Absolute Lymphocytes: 2715 {cells}/uL (ref 850–3900)
Absolute Monocytes: 765 {cells}/uL (ref 200–950)
Basophils Absolute: 36 {cells}/uL (ref 0–200)
Basophils Relative: 0.4 %
Eosinophils Absolute: 258 {cells}/uL (ref 15–500)
Eosinophils Relative: 2.9 %
HCT: 36.1 % — ABNORMAL LOW (ref 38.5–50.0)
Hemoglobin: 11.4 g/dL — ABNORMAL LOW (ref 13.2–17.1)
MCH: 31.8 pg (ref 27.0–33.0)
MCHC: 31.6 g/dL — ABNORMAL LOW (ref 32.0–36.0)
MCV: 100.6 fL — ABNORMAL HIGH (ref 80.0–100.0)
MPV: 8.6 fL (ref 7.5–12.5)
Monocytes Relative: 8.6 %
Neutro Abs: 5126 {cells}/uL (ref 1500–7800)
Neutrophils Relative %: 57.6 %
Platelets: 262 10*3/uL (ref 140–400)
RBC: 3.59 10*6/uL — ABNORMAL LOW (ref 4.20–5.80)
RDW: 13.2 % (ref 11.0–15.0)
Total Lymphocyte: 30.5 %
WBC: 8.9 10*3/uL (ref 3.8–10.8)

## 2023-08-05 LAB — COMPREHENSIVE METABOLIC PANEL WITH GFR
AG Ratio: 1.7 (calc) (ref 1.0–2.5)
ALT: 39 U/L (ref 9–46)
AST: 34 U/L (ref 10–35)
Albumin: 4.3 g/dL (ref 3.6–5.1)
Alkaline phosphatase (APISO): 55 U/L (ref 35–144)
BUN/Creatinine Ratio: 15 (calc) (ref 6–22)
BUN: 19 mg/dL (ref 7–25)
CO2: 25 mmol/L (ref 20–32)
Calcium: 9.5 mg/dL (ref 8.6–10.3)
Chloride: 101 mmol/L (ref 98–110)
Creat: 1.29 mg/dL — ABNORMAL HIGH (ref 0.70–1.22)
Globulin: 2.5 g/dL (ref 1.9–3.7)
Glucose, Bld: 95 mg/dL (ref 65–99)
Potassium: 4.1 mmol/L (ref 3.5–5.3)
Sodium: 136 mmol/L (ref 135–146)
Total Bilirubin: 0.7 mg/dL (ref 0.2–1.2)
Total Protein: 6.8 g/dL (ref 6.1–8.1)
eGFR: 54 mL/min/{1.73_m2} — ABNORMAL LOW (ref >=60)

## 2023-08-05 LAB — HEMOGLOBIN A1C
Hgb A1c MFr Bld: 7.1 % — ABNORMAL HIGH (ref <5.7)
Mean Plasma Glucose: 157 mg/dL
eAG (mmol/L): 8.7 mmol/L

## 2023-08-05 LAB — LIPID PANEL
Cholesterol: 113 mg/dL (ref <200)
HDL: 40 mg/dL (ref >=40)
LDL Cholesterol (Calc): 56 mg/dL
Non-HDL Cholesterol (Calc): 73 mg/dL (ref <130)
Total CHOL/HDL Ratio: 2.8 (calc) (ref <5.0)
Triglycerides: 87 mg/dL (ref <150)

## 2023-08-05 LAB — MICROALBUMIN / CREATININE URINE RATIO
Creatinine, Urine: 164 mg/dL (ref 20–320)
Microalb Creat Ratio: 31 mg/g{creat} — ABNORMAL HIGH (ref <30)
Microalb, Ur: 5.1 mg/dL

## 2023-08-06 ENCOUNTER — Telehealth: Payer: Self-pay

## 2023-08-06 NOTE — Telephone Encounter (Signed)
 Labs have been sent electronically via Epic

## 2023-08-06 NOTE — Telephone Encounter (Signed)
 Copied from CRM [id number]. Topic: Clinical - Lab/Test Results >> Aug 06, 2023 11:26 AM Marlan Silva wrote: Reason for CRM: [facility] office called [facility] ) in stating that the patient said he reached out to Dr. Darren Em to fax over his most recent lab results. Results can be faxed to [phone] attention [facility].

## 2023-08-10 ENCOUNTER — Telehealth: Payer: Self-pay | Admitting: Family Medicine

## 2023-08-10 NOTE — Telephone Encounter (Signed)
 Copied from CRM [id number]. Topic: General - Other >> Aug 10, 2023  4:45 PM Baldo Levan wrote: Reason for CRM: Patient called in stating that [facility] has not received the lab results yet. Patient is asking for these to be faxed over.

## 2023-08-11 NOTE — Telephone Encounter (Signed)
 Patient informed results were sent electronically via epic and voiced understanding

## 2023-08-18 DIAGNOSIS — M65332 Trigger finger, left middle finger: Secondary | ICD-10-CM | POA: Diagnosis not present

## 2023-08-18 DIAGNOSIS — M65312 Trigger thumb, left thumb: Secondary | ICD-10-CM | POA: Diagnosis not present

## 2023-08-18 DIAGNOSIS — M65342 Trigger finger, left ring finger: Secondary | ICD-10-CM | POA: Diagnosis not present

## 2023-10-06 DIAGNOSIS — M545 Low back pain, unspecified: Secondary | ICD-10-CM | POA: Diagnosis not present

## 2023-10-06 DIAGNOSIS — K760 Fatty (change of) liver, not elsewhere classified: Secondary | ICD-10-CM | POA: Diagnosis not present

## 2023-10-06 DIAGNOSIS — M0579 Rheumatoid arthritis with rheumatoid factor of multiple sites without organ or systems involvement: Secondary | ICD-10-CM | POA: Diagnosis not present

## 2023-10-06 DIAGNOSIS — M65322 Trigger finger, left index finger: Secondary | ICD-10-CM | POA: Diagnosis not present

## 2023-10-06 DIAGNOSIS — R5382 Chronic fatigue, unspecified: Secondary | ICD-10-CM | POA: Diagnosis not present

## 2023-10-21 IMAGING — DX DG CERVICAL SPINE COMPLETE 4+V
5 series · 5 of 5 positions shown · non-contrast
Comparison: None.

CLINICAL DATA: Right-sided neck pain

EXAM:
CERVICAL SPINE - COMPLETE 4+ VIEW

[cervical spine lat]
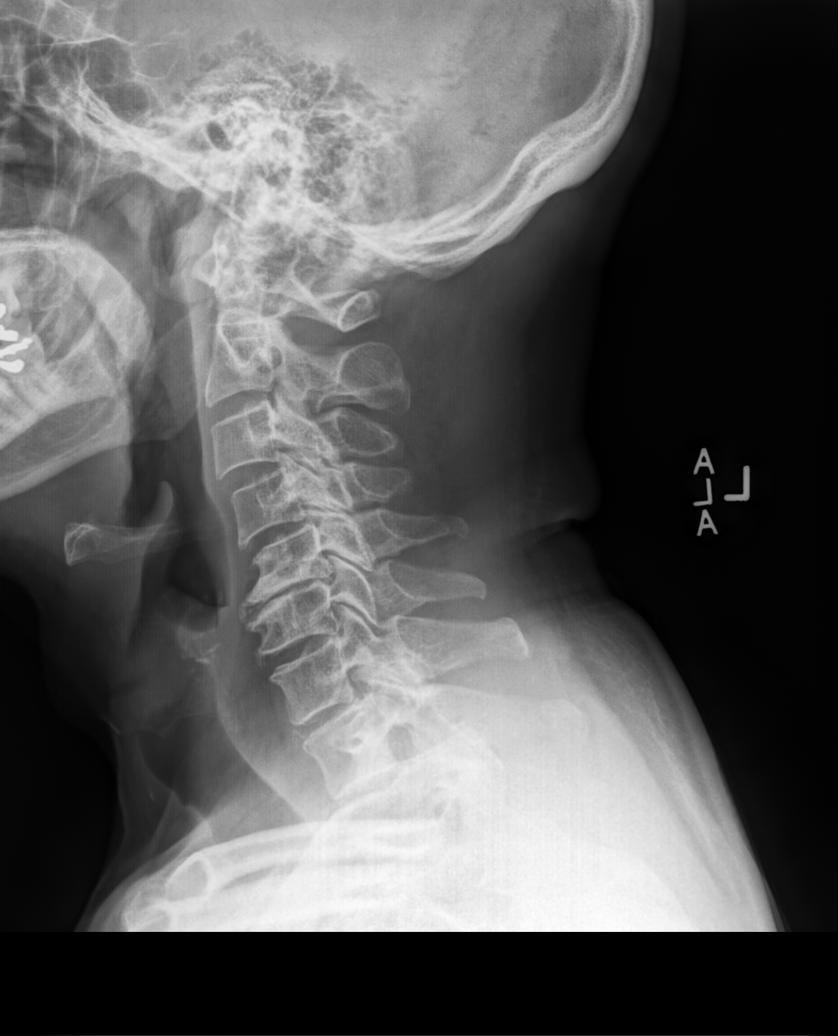

[cervical spine oblique (1 of 2)]
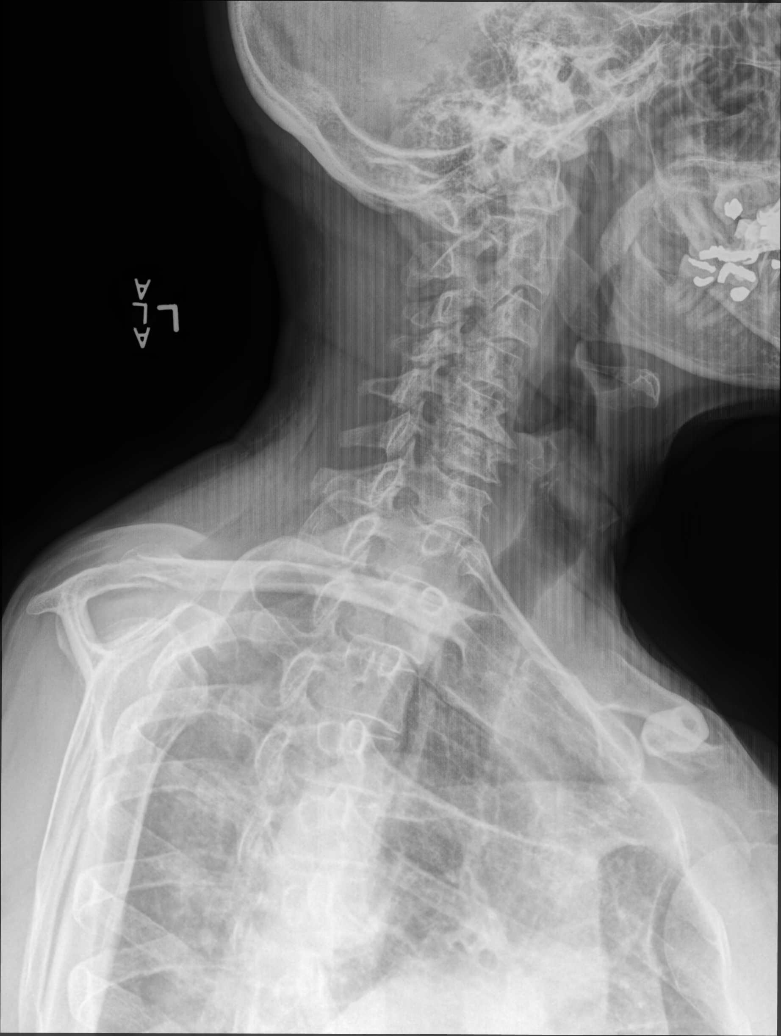

[cervical spine oblique (2 of 2)]
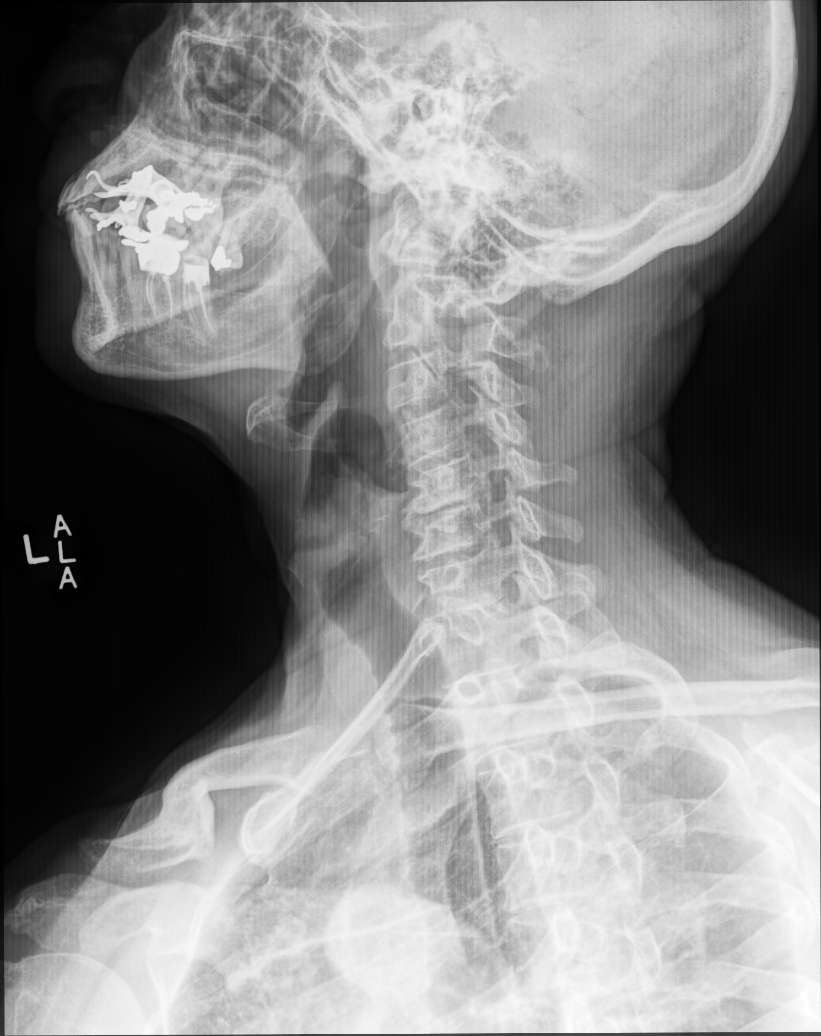

[cervical spine ap]
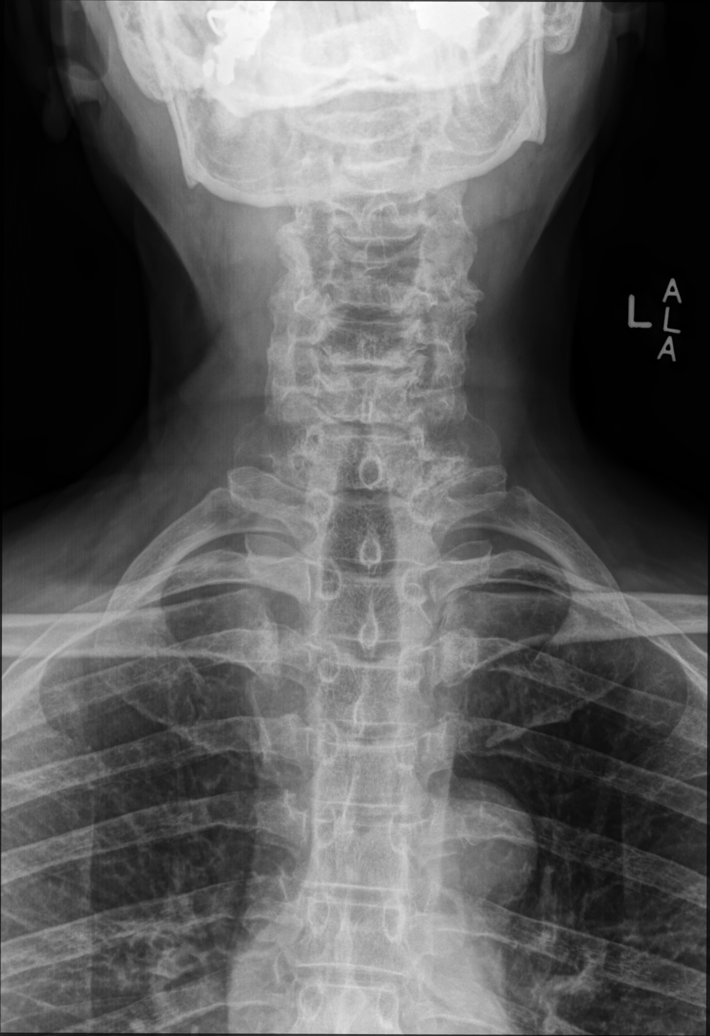

[cervical spine open mouth ap]
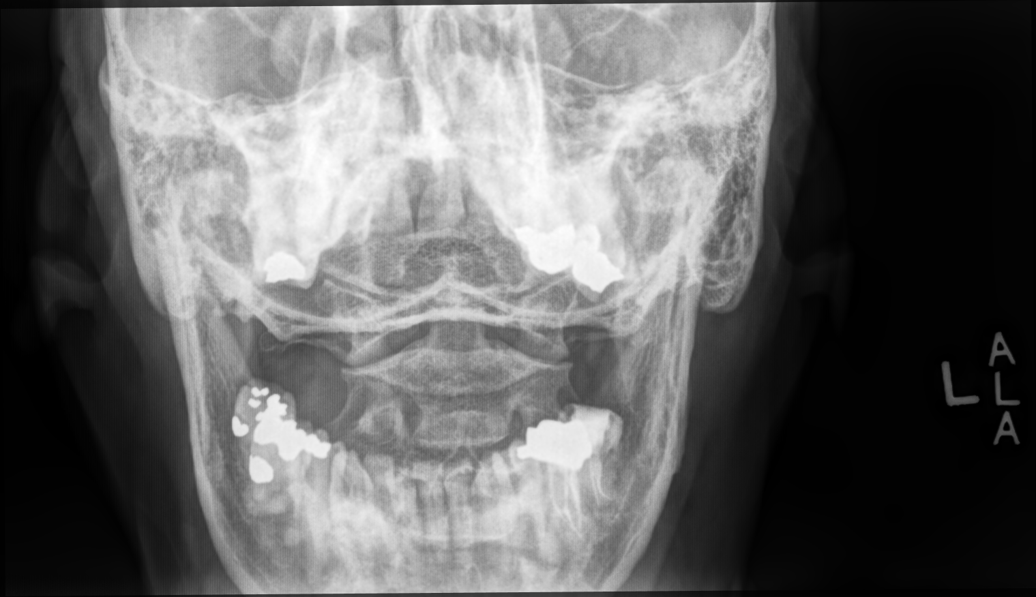

[5 of 5 positions shown; findings below may reference images not displayed]

FINDINGS: Seven cervical segments are well visualized. Vertebral body height
is well maintained. Disc space narrowing is noted at C5-6.
Osteophytic changes are noted from C4-C7. Facet hypertrophic changes
noted. Mild neural foraminal narrowing is noted most marked at C4-5
on the left and C3-4 on the right. The odontoid is within normal
limits. Visualized lung apices are unremarkable. No soft tissue
abnormality is seen.
IMPRESSION: Degenerative change without acute abnormality.

## 2023-11-01 ENCOUNTER — Ambulatory Visit: Admitting: Family Medicine

## 2023-11-22 ENCOUNTER — Encounter: Payer: Self-pay | Admitting: Family Medicine

## 2023-11-22 ENCOUNTER — Ambulatory Visit (INDEPENDENT_AMBULATORY_CARE_PROVIDER_SITE_OTHER): Admitting: Family Medicine

## 2023-11-22 VITALS — BP 118/66 | HR 61 | Temp 97.9°F | Wt 172.2 lb

## 2023-11-22 DIAGNOSIS — I10 Essential (primary) hypertension: Secondary | ICD-10-CM

## 2023-11-22 DIAGNOSIS — E1165 Type 2 diabetes mellitus with hyperglycemia: Secondary | ICD-10-CM | POA: Diagnosis not present

## 2023-11-22 DIAGNOSIS — Z7984 Long term (current) use of oral hypoglycemic drugs: Secondary | ICD-10-CM | POA: Diagnosis not present

## 2023-11-22 DIAGNOSIS — E785 Hyperlipidemia, unspecified: Secondary | ICD-10-CM

## 2023-11-22 LAB — POCT GLYCOSYLATED HEMOGLOBIN (HGB A1C): Hemoglobin A1C: 6.8 % — AB (ref 4.0–5.6)

## 2023-11-22 MED ORDER — HYDROXYZINE HCL 25 MG PO TABS: 25.0000 mg | ORAL_TABLET | Freq: Three times a day (TID) | ORAL | 1 refills | Status: AC | PRN

## 2023-11-22 MED ORDER — HYDROXYZINE HCL 25 MG PO TABS: 25.0000 mg | ORAL_TABLET | Freq: Three times a day (TID) | ORAL | 1 refills | Status: DC | PRN

## 2023-11-22 NOTE — Progress Notes (Signed)
 Established Patient Office Visit  Subjective   Patient ID: Kevin Wise, male    DOB: [birth year]  Age: 88 y.o. MRN: [id number]  Chief Complaint  Patient presents with   Medical Management of Chronic Issues    HPI   Mr. Hijazi has history of hypertension, GERD, type 2 diabetes, rheumatoid arthritis, glaucoma, hyperlipidemia.  Generally doing well.  He does have some persistent itching.  Has taken low-dose Atarax  in the past.  Has tried topical steroids without much benefit.  Has seen dermatologist for this previously.  Compliant with medications.  Diabetes has generally been controlled.  Last A1c was up slightly at 7.1.  He has been very diligent with diet since then.  Most recent urine microalbumin was 31.  Blood pressure controlled with losartan  50 mg daily and amlodipine  5 mg daily.  He takes atorvastatin  40 mg daily for hyperlipidemia with recent LDL cholesterol 56 back in June.  Denies any myalgias or other side effects  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   ARTHRITIS 01/06/2007   CAROTID ARTERY STENOSIS, RIGHT 01/10/2009   Diabetes mellitus without complication (HCC)    ERECTILE DYSFUNCTION 01/06/2007   HYPERGLYCEMIA, BORDERLINE 12/02/2009   HYPERLIPIDEMIA 01/06/2007   HYPERTENSION, BENIGN ESSENTIAL 01/06/2007   HYPOTHYROIDISM 01/10/2008   LIVER FUNCTION TESTS, ABNORMAL, HX OF 12/02/2009   Other symptoms involving cardiovascular system 01/10/2009   Rosacea 01/06/2007   UNS ADVRS EFF OTH RX MEDICINAL&BIOLOGICAL SBSTNC 01/10/2008   VITAMIN D  DEFICIENCY 01/10/2008   Past Surgical History:  Procedure Laterality Date   HERNIA REPAIR     bilateral inguinal    TONSILLECTOMY      reports that he has never smoked. He has never used smokeless tobacco. He reports that he does not use drugs. No history on file for alcohol use. family history includes Diabetes in his mother; Hyperlipidemia in his mother. No Known Allergies  Review of Systems   Constitutional:  Negative for malaise/fatigue.  Eyes:  Negative for blurred vision.  Respiratory:  Negative for shortness of breath.   Cardiovascular:  Negative for chest pain.  Skin:  Positive for itching.  Neurological:  Negative for dizziness, weakness and headaches.      Objective:     BP 118/66   Pulse 61   Temp 97.9 F (36.6 C) (Oral)   Wt 172 lb 3.2 oz (78.1 kg)   SpO2 98%   BMI 27.35 kg/m  BP Readings from Last 3 Encounters:  11/22/23 118/66  08/04/23 122/64  05/03/23 138/78   Wt Readings from Last 3 Encounters:  11/22/23 172 lb 3.2 oz (78.1 kg)  08/04/23 164 lb 9.6 oz (74.7 kg)  05/03/23 171 lb 4.8 oz (77.7 kg)      Physical Exam Constitutional:      Appearance: He is well-developed.  Eyes:     Pupils: Pupils are equal, round, and reactive to light.  Neck:     Thyroid : No thyromegaly.  Cardiovascular:     Rate and Rhythm: Normal rate and regular rhythm.  Pulmonary:     Effort: Pulmonary effort is normal. No respiratory distress.     Breath sounds: Normal breath sounds. No wheezing or rales.  Musculoskeletal:     Cervical back: Neck supple.  Skin:    Findings: No rash.  Neurological:     Mental Status: He is alert and oriented to person, place, and time.      Results for orders placed or performed in visit on 11/22/23  POC HgB A1c  Result Value Ref Range   Hemoglobin A1C 6.8 (A) 4.0 - 5.6 %   HbA1c POC (<> result, manual entry)     HbA1c, POC (prediabetic range)     HbA1c, POC (controlled diabetic range)      Last CBC Lab Results  Component Value Date   WBC 8.9 08/04/2023   HGB 11.4 (L) 08/04/2023   HCT 36.1 (L) 08/04/2023   MCV 100.6 (H) 08/04/2023   MCH 31.8 08/04/2023   RDW 13.2 08/04/2023   PLT 262 08/04/2023   Last metabolic panel Lab Results  Component Value Date   GLUCOSE 95 08/04/2023   NA 136 08/04/2023   K 4.1 08/04/2023   CL 101 08/04/2023   CO2 25 08/04/2023   BUN 19 08/04/2023   CREATININE 1.29 (H) 08/04/2023    EGFR 54 (L) 08/04/2023   CALCIUM  9.5 08/04/2023   PHOS 2.7 12/02/2009   PROT 6.8 08/04/2023   ALBUMIN 4.6 08/03/2022   BILITOT 0.7 08/04/2023   ALKPHOS 60 08/03/2022   AST 34 08/04/2023   ALT 39 08/04/2023   Last lipids Lab Results  Component Value Date   CHOL 113 08/04/2023   HDL 40 08/04/2023   LDLCALC 56 08/04/2023   TRIG 87 08/04/2023   CHOLHDL 2.8 08/04/2023   Last hemoglobin A1c Lab Results  Component Value Date   HGBA1C 6.8 (A) 11/22/2023      The ASCVD Risk score (Arnett DK, et al., 2019) failed to calculate for the following reasons:   The 2019 ASCVD risk score is only valid for ages 37 to 21    Assessment & Plan:    #1 type 2 diabetes controlled with A1c today 6.8%.  This is improved from 7.1% last visit.  Continue metformin .  He has long history of good compliance with diet.  Reassess in 3 months.  Continue yearly diabetic eye exam.  Repeat urine microalbumin at follow-up  #2 hypertension stable and well-controlled on amlodipine  and losartan .  Continue current regimen and low-sodium diet  #3 hyperlipidemia treated with atorvastatin  40 mg daily.  Tolerating well.  Recent LDL cholesterol at goal at 56.  He plans to get flu vaccine around end of October and COVID booster next week  Return in about 3 months (around 02/21/2024).    Wolm Scarlet, MD

## 2023-11-22 NOTE — Patient Instructions (Signed)
 A1c today improved to 6.8%  Consider trial of Aveeno moisturizer body wash or soap for itching

## 2023-11-23 DIAGNOSIS — H35033 Hypertensive retinopathy, bilateral: Secondary | ICD-10-CM | POA: Diagnosis not present

## 2023-11-23 DIAGNOSIS — H25813 Combined forms of age-related cataract, bilateral: Secondary | ICD-10-CM | POA: Diagnosis not present

## 2023-11-23 DIAGNOSIS — H04123 Dry eye syndrome of bilateral lacrimal glands: Secondary | ICD-10-CM | POA: Diagnosis not present

## 2023-11-23 DIAGNOSIS — H401132 Primary open-angle glaucoma, bilateral, moderate stage: Secondary | ICD-10-CM | POA: Diagnosis not present

## 2023-12-06 ENCOUNTER — Ambulatory Visit: Admitting: Family Medicine

## 2023-12-10 DIAGNOSIS — H25813 Combined forms of age-related cataract, bilateral: Secondary | ICD-10-CM | POA: Diagnosis not present

## 2023-12-10 DIAGNOSIS — H524 Presbyopia: Secondary | ICD-10-CM | POA: Diagnosis not present

## 2023-12-10 DIAGNOSIS — H401132 Primary open-angle glaucoma, bilateral, moderate stage: Secondary | ICD-10-CM | POA: Diagnosis not present

## 2023-12-10 DIAGNOSIS — E119 Type 2 diabetes mellitus without complications: Secondary | ICD-10-CM | POA: Diagnosis not present

## 2023-12-10 DIAGNOSIS — H5213 Myopia, bilateral: Secondary | ICD-10-CM | POA: Diagnosis not present

## 2023-12-10 DIAGNOSIS — H43823 Vitreomacular adhesion, bilateral: Secondary | ICD-10-CM | POA: Diagnosis not present

## 2023-12-22 DIAGNOSIS — M0579 Rheumatoid arthritis with rheumatoid factor of multiple sites without organ or systems involvement: Secondary | ICD-10-CM | POA: Diagnosis not present

## 2023-12-22 DIAGNOSIS — M65322 Trigger finger, left index finger: Secondary | ICD-10-CM | POA: Diagnosis not present

## 2023-12-22 DIAGNOSIS — R5382 Chronic fatigue, unspecified: Secondary | ICD-10-CM | POA: Diagnosis not present

## 2023-12-22 DIAGNOSIS — M545 Low back pain, unspecified: Secondary | ICD-10-CM | POA: Diagnosis not present

## 2023-12-22 DIAGNOSIS — K760 Fatty (change of) liver, not elsewhere classified: Secondary | ICD-10-CM | POA: Diagnosis not present

## 2023-12-25 ENCOUNTER — Other Ambulatory Visit: Payer: Self-pay | Admitting: Family Medicine

## 2023-12-27 ENCOUNTER — Ambulatory Visit: Payer: Self-pay

## 2023-12-27 ENCOUNTER — Ambulatory Visit (INDEPENDENT_AMBULATORY_CARE_PROVIDER_SITE_OTHER): Admitting: Family Medicine

## 2023-12-27 VITALS — BP 138/74 | HR 84 | Temp 98.0°F | Wt 175.4 lb

## 2023-12-27 DIAGNOSIS — L03032 Cellulitis of left toe: Secondary | ICD-10-CM

## 2023-12-27 MED ORDER — CEPHALEXIN 500 MG PO CAPS: 500.0000 mg | ORAL_CAPSULE | Freq: Three times a day (TID) | ORAL | 0 refills | Status: DC

## 2023-12-27 MED ORDER — ATORVASTATIN CALCIUM 40 MG PO TABS: ORAL_TABLET | ORAL | 3 refills | Status: AC

## 2023-12-27 MED ORDER — AMLODIPINE BESYLATE 5 MG PO TABS: 5.0000 mg | ORAL_TABLET | Freq: Every day | ORAL | 3 refills | Status: AC

## 2023-12-27 MED ORDER — METFORMIN HCL 850 MG PO TABS: ORAL_TABLET | ORAL | 3 refills | Status: AC

## 2023-12-27 NOTE — Progress Notes (Signed)
 Established Patient Office Visit  Subjective   Patient ID: Kevin Wise, male    DOB: [birth year]  Age: 88 y.o. MRN: [id number]  Chief Complaint  Patient presents with   Toe Pain    HPI   Kevin Wise is seen today as a work in with left great toe soreness past 4 days.  Denies any injury.  He wondered initially this may be gout.  Has no history of gout.  Does not have any pain around the MTP joint.  Has little bit of redness and swelling of the soft tissue medial aspect of the left great toe.  No recent change of shoewear.  No drainage.  No fevers or chills.  He has type 2 diabetes which is controlled with recent A1c 6.8%.  Non-smoker.  No known peripheral vascular disease.  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   ARTHRITIS 01/06/2007   CAROTID ARTERY STENOSIS, RIGHT 01/10/2009   Diabetes mellitus without complication (HCC)    ERECTILE DYSFUNCTION 01/06/2007   HYPERGLYCEMIA, BORDERLINE 12/02/2009   HYPERLIPIDEMIA 01/06/2007   HYPERTENSION, BENIGN ESSENTIAL 01/06/2007   HYPOTHYROIDISM 01/10/2008   LIVER FUNCTION TESTS, ABNORMAL, HX OF 12/02/2009   Other symptoms involving cardiovascular system 01/10/2009   Rosacea 01/06/2007   UNS ADVRS EFF OTH RX MEDICINAL&BIOLOGICAL SBSTNC 01/10/2008   VITAMIN D  DEFICIENCY 01/10/2008   Past Surgical History:  Procedure Laterality Date   HERNIA REPAIR     bilateral inguinal    TONSILLECTOMY      reports that he has never smoked. He has never used smokeless tobacco. He reports that he does not use drugs. No history on file for alcohol use. family history includes Diabetes in his mother; Hyperlipidemia in his mother. No Known Allergies  Review of Systems  Constitutional:  Negative for chills and fever.      Objective:     BP 138/74   Pulse 84   Temp 98 F (36.7 C) (Oral)   Wt 175 lb 6.4 oz (79.6 kg)   SpO2 98%   BMI 27.86 kg/m    Physical Exam Vitals reviewed.  Constitutional:      General: He is not  in acute distress.    Appearance: He is not ill-appearing.  Cardiovascular:     Rate and Rhythm: Normal rate and regular rhythm.  Skin:    Comments: Left great toe reveals no warmth or tenderness or swelling at the MTP joint.  He has a minimal amount of redness and tenderness involving the skin on the medial aspect of the left great toe.  No visible purulence.  No granulation tissue.  Excellent capillary refill.  2+ dorsalis pedis pulse.  Neurological:     Mental Status: He is alert.      No results found for any visits on 12/27/23.    The ASCVD Risk score (Arnett DK, et al., 2019) failed to calculate for the following reasons:   The 2019 ASCVD risk score is only valid for ages 70 to 47    Assessment & Plan:   Mild paronychia/cellulitis left great toe.  No evidence for abscess that would need drainage.  No evidence to suggest gout.  He does have history of type 2 diabetes but well-controlled  -Recommend start Keflex 500 mg 3 times daily for 7 days - Recommend warm salt water soaks 2 times daily - Follow-up for any progressive redness, swelling, or other concerns or if not improving significantly over the next week - Consider podiatry referral  if not improved in 1 week  Wolm Scarlet, MD

## 2023-12-27 NOTE — Patient Instructions (Signed)
 Start the antibiotic and take three times daily  Warm salt water soaks for 15-20 minutes twice daily  Watch for any increased redness or swelling  Let me know if no improvement in one week.

## 2023-12-27 NOTE — Telephone Encounter (Signed)
 Please note - pt states has been taking NSAID and mentioned that Dr. Micheal doesn't want him taking them.   FYI Only or Action Required?: FYI only for provider: 11/26/23.  Patient was last seen in primary care on 11/22/2023 by Micheal Wolm ORN, MD.  Called Nurse Triage reporting Foot Pain.  Symptoms began several days ago.  Interventions attempted: OTC medications: Aleve .  Symptoms are: gradually worsening.  Triage Disposition: See PCP When Office is Open (Within 3 Days)  Patient/caregiver understands and will follow disposition?:  Reason for Disposition  [1] MODERATE pain (e.g., interferes with normal activities, limping) AND [2] present > 3 days  Answer Assessment - Initial Assessment Questions 1. ONSET: When did the pain start?      12/23/23  2. LOCATION: Where is the pain located?      Left foot  3. PAIN: How bad is the pain?    (Scale 1-10; or mild, moderate, severe)     Varies, up to 7-8/10  4. WORK OR EXERCISE: Has there been any recent work or exercise that involved this part of the body?      Denies  5. CAUSE: What do you think is causing the foot pain?     Unknown  6. OTHER SYMPTOMS: Do you have any other symptoms? (e.g., leg pain, rash, fever, numbness)     Denies  Protocols used: Foot Pain-A-AH Copied from CRM [id number]. Topic: Clinical - Red Word Triage >> Dec 27, 2023  8:05 AM Kevin Wise wrote: Red Word that prompted transfer to Nurse Triage: Issues with left foot  Pain - started last thursday

## 2023-12-31 ENCOUNTER — Other Ambulatory Visit: Payer: Self-pay | Admitting: Family Medicine

## 2024-01-04 ENCOUNTER — Telehealth: Payer: Self-pay | Admitting: *Deleted

## 2024-01-04 NOTE — Telephone Encounter (Signed)
 Copied from CRM [id number]. Topic: General - Other >> Jan 04, 2024  8:03 AM Kevin Wise wrote: Reason for CRM: Patient calling to inform provider that his toe is healing per PCP recommendations. No additional details provided.

## 2024-01-04 NOTE — Telephone Encounter (Signed)
 Noted.  Thanks.  Wolm LELON Scarlet MD [facility]

## 2024-01-18 NOTE — Progress Notes (Signed)
   01/18/2024  Patient ID: Kevin Wise, male   DOB: 12/06/1935, 88 y.o.   MRN: [id number]  Pharmacy Quality Measure Review  This patient is appearing on a report for being at risk of failing the adherence measure for diabetes medications this calendar year.   Medication: Metformin  Last fill date: 12/27/23 for 90 day supply  Insurance report was not up to date. No action needed at this time.   Jon VEAR Lindau, PharmD Clinical Pharmacist [phone]

## 2024-02-03 ENCOUNTER — Ambulatory Visit: Admitting: Family Medicine

## 2024-02-14 ENCOUNTER — Ambulatory Visit: Admitting: Family Medicine

## 2024-02-15 ENCOUNTER — Encounter: Payer: Self-pay | Admitting: Family Medicine

## 2024-02-15 ENCOUNTER — Ambulatory Visit: Admitting: Family Medicine

## 2024-02-15 VITALS — BP 126/68 | HR 76 | Temp 98.1°F | Wt 173.7 lb

## 2024-02-15 DIAGNOSIS — E1165 Type 2 diabetes mellitus with hyperglycemia: Secondary | ICD-10-CM | POA: Diagnosis not present

## 2024-02-15 DIAGNOSIS — E785 Hyperlipidemia, unspecified: Secondary | ICD-10-CM

## 2024-02-15 DIAGNOSIS — I10 Essential (primary) hypertension: Secondary | ICD-10-CM

## 2024-02-15 NOTE — Progress Notes (Signed)
 "  Established Patient Office Visit  Subjective   Patient ID: Kevin Wise, male    DOB: 21-May-1935  Age: 88 y.o. MRN: [id number]  Chief Complaint  Patient presents with   Medical Management of Chronic Issues    HPI    Kevin Wise is seen for routine medical follow-up.  He has history of hypertension, type 2 diabetes, rheumatoid arthritis, glaucoma, hyperlipidemia.  Followed by rheumatology and maintained on methotrexate injections.  Arthritis is stable.  He has some cellulitis changes of the great toe recently and he states those have resolved following antibiotics with Keflex .  He recently had cataract surgery bilaterally and is seeing much better since the surgery.  Denies any recent chest pains or dizziness.  Appetite and weight stable.  Medications reviewed and include methotrexate, metformin , losartan , folic acid supplement, atorvastatin , amlodipine   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   ARTHRITIS 01/06/2007   CAROTID ARTERY STENOSIS, RIGHT 01/10/2009   Diabetes mellitus without complication (HCC)    ERECTILE DYSFUNCTION 01/06/2007   HYPERGLYCEMIA, BORDERLINE 12/02/2009   HYPERLIPIDEMIA 01/06/2007   HYPERTENSION, BENIGN ESSENTIAL 01/06/2007   HYPOTHYROIDISM 01/10/2008   LIVER FUNCTION TESTS, ABNORMAL, HX OF 12/02/2009   Other symptoms involving cardiovascular system 01/10/2009   Rosacea 01/06/2007   [employer]S ADVRS EFF OTH RX MEDICINAL&BIOLOGICAL SBSTNC 01/10/2008   VITAMIN D  DEFICIENCY 01/10/2008   Past Surgical History:  Procedure Laterality Date   HERNIA REPAIR     bilateral inguinal    TONSILLECTOMY      reports that he has never smoked. He has never used smokeless tobacco. He reports that he does not use drugs. No history on file for alcohol use. family history includes Diabetes in his mother; Hyperlipidemia in his mother. Allergies[1]  Review of Systems  Constitutional:  Negative for malaise/fatigue.  Eyes:  Negative for blurred vision.   Respiratory:  Negative for shortness of breath.   Cardiovascular:  Negative for chest pain.  Neurological:  Negative for dizziness, weakness and headaches.      Objective:     BP 126/68   Pulse 76   Temp 98.1 F (36.7 C) (Oral)   Wt 173 lb 11.2 oz (78.8 kg)   SpO2 98%   BMI 27.59 kg/m  BP Readings from Last 3 Encounters:  02/15/24 126/68  12/27/23 138/74  11/22/23 118/66   Wt Readings from Last 3 Encounters:  02/15/24 173 lb 11.2 oz (78.8 kg)  12/27/23 175 lb 6.4 oz (79.6 kg)  11/22/23 172 lb 3.2 oz (78.1 kg)      Physical Exam Vitals reviewed.  Constitutional:      General: He is not in acute distress.    Appearance: He is well-developed. He is not ill-appearing.  Eyes:     Pupils: Pupils are equal, round, and reactive to light.  Neck:     Thyroid : No thyromegaly.  Cardiovascular:     Rate and Rhythm: Normal rate and regular rhythm.  Pulmonary:     Effort: Pulmonary effort is normal. No respiratory distress.     Breath sounds: Normal breath sounds. No wheezing or rales.  Musculoskeletal:     Cervical back: Neck supple.     Right lower leg: No edema.     Left lower leg: No edema.  Neurological:     Mental Status: He is alert and oriented to person, place, and time.      No results found for any visits on 02/15/24.  Last CBC Lab Results  Component  Value Date   WBC 8.9 08/04/2023   HGB 11.4 (L) 08/04/2023   HCT 36.1 (L) 08/04/2023   MCV 100.6 (H) 08/04/2023   MCH 31.8 08/04/2023   RDW 13.2 08/04/2023   PLT 262 08/04/2023   Last metabolic panel Lab Results  Component Value Date   GLUCOSE 95 08/04/2023   NA 136 08/04/2023   K 4.1 08/04/2023   CL 101 08/04/2023   CO2 25 08/04/2023   BUN 19 08/04/2023   CREATININE 1.29 (H) 08/04/2023   EGFR 54 (L) 08/04/2023   CALCIUM  9.5 08/04/2023   PHOS 2.7 12/02/2009   PROT 6.8 08/04/2023   ALBUMIN 4.6 08/03/2022   BILITOT 0.7 08/04/2023   ALKPHOS 60 08/03/2022   AST 34 08/04/2023   ALT 39 08/04/2023    Last lipids Lab Results  Component Value Date   CHOL 113 08/04/2023   HDL 40 08/04/2023   LDLCALC 56 08/04/2023   TRIG 87 08/04/2023   CHOLHDL 2.8 08/04/2023   Last hemoglobin A1c Lab Results  Component Value Date   HGBA1C 6.8 (A) 11/22/2023      The ASCVD Risk score (Arnett DK, et al., 2019) failed to calculate for the following reasons:   The 2019 ASCVD risk score is only valid for ages 70 to 66   * - Cholesterol units were assumed    Assessment & Plan:   #1 type 2 diabetes.  History of good control on metformin .  Today's visit is slightly less than 3 months since last A1c check and we recommended waiting till next office visit to reassess.  His A1c's have been stable over several years.  Continue lower glycemic diet with handout given  #2 hypertension stable and well-controlled on regimen above including amlodipine  and losartan .  Continue low-sodium diet  #3 hyperlipidemia treated with atorvastatin .  Tolerating well with no side effects.  No myalgias.  Reviewed recent fasting labs from June.  LDL cholesterol control at that time at 56.  Continue low saturated fat diet  Wolm Scarlet, MD     [1] No Known Allergies  "

## 2024-04-10 ENCOUNTER — Ambulatory Visit

## 2024-06-16 ENCOUNTER — Ambulatory Visit: Admitting: Family Medicine
